# Patient Record
Sex: Male | Born: 1970 | Hispanic: Yes | Marital: Single | Smoking: Current some day smoker
Health system: Southern US, Community
[De-identification: ages and names within clinical notes are randomized; demographics above are authoritative.]

## PROBLEM LIST (undated history)

## (undated) DIAGNOSIS — G4730 Sleep apnea, unspecified: Secondary | ICD-10-CM

## (undated) DIAGNOSIS — I10 Essential (primary) hypertension: Secondary | ICD-10-CM

## (undated) DIAGNOSIS — M1990 Unspecified osteoarthritis, unspecified site: Secondary | ICD-10-CM

## (undated) DIAGNOSIS — E119 Type 2 diabetes mellitus without complications: Secondary | ICD-10-CM

## (undated) DIAGNOSIS — E785 Hyperlipidemia, unspecified: Secondary | ICD-10-CM

## (undated) DIAGNOSIS — D649 Anemia, unspecified: Secondary | ICD-10-CM

## (undated) DIAGNOSIS — N189 Chronic kidney disease, unspecified: Secondary | ICD-10-CM

## (undated) HISTORY — DX: Sleep apnea, unspecified: G47.30

## (undated) HISTORY — DX: Hyperlipidemia, unspecified: E78.5

## (undated) HISTORY — DX: Anemia, unspecified: D64.9

## (undated) HISTORY — DX: Chronic kidney disease, unspecified: N18.9

## (undated) HISTORY — DX: Type 2 diabetes mellitus without complications: E11.9

## (undated) HISTORY — PX: NO PAST SURGERIES: SHX2092

## (undated) HISTORY — DX: Essential (primary) hypertension: I10

## (undated) HISTORY — DX: Unspecified osteoarthritis, unspecified site: M19.90

---

## 2004-07-02 ENCOUNTER — Emergency Department: Payer: Self-pay | Admitting: Emergency Medicine

## 2004-07-04 ENCOUNTER — Inpatient Hospital Stay: Payer: Self-pay | Admitting: Internal Medicine

## 2004-08-10 ENCOUNTER — Ambulatory Visit: Payer: Self-pay | Admitting: Family Medicine

## 2005-09-20 ENCOUNTER — Emergency Department: Payer: Self-pay | Admitting: Emergency Medicine

## 2012-02-21 LAB — HM DIABETES EYE EXAM

## 2012-06-23 ENCOUNTER — Emergency Department: Payer: Self-pay | Admitting: Emergency Medicine

## 2012-12-11 ENCOUNTER — Ambulatory Visit: Payer: Self-pay | Admitting: Adult Health

## 2012-12-23 ENCOUNTER — Ambulatory Visit: Payer: Self-pay | Admitting: Adult Health

## 2014-10-14 ENCOUNTER — Other Ambulatory Visit: Payer: Self-pay

## 2014-10-21 ENCOUNTER — Ambulatory Visit: Payer: Self-pay

## 2014-11-23 ENCOUNTER — Ambulatory Visit: Payer: Self-pay

## 2014-12-28 IMAGING — CR DG CHEST 2V
1 series · 1 of 1 positions shown · non-contrast
Comparison: none

REASON FOR EXAM: Tachycardia
COMMENTS:

[pa]
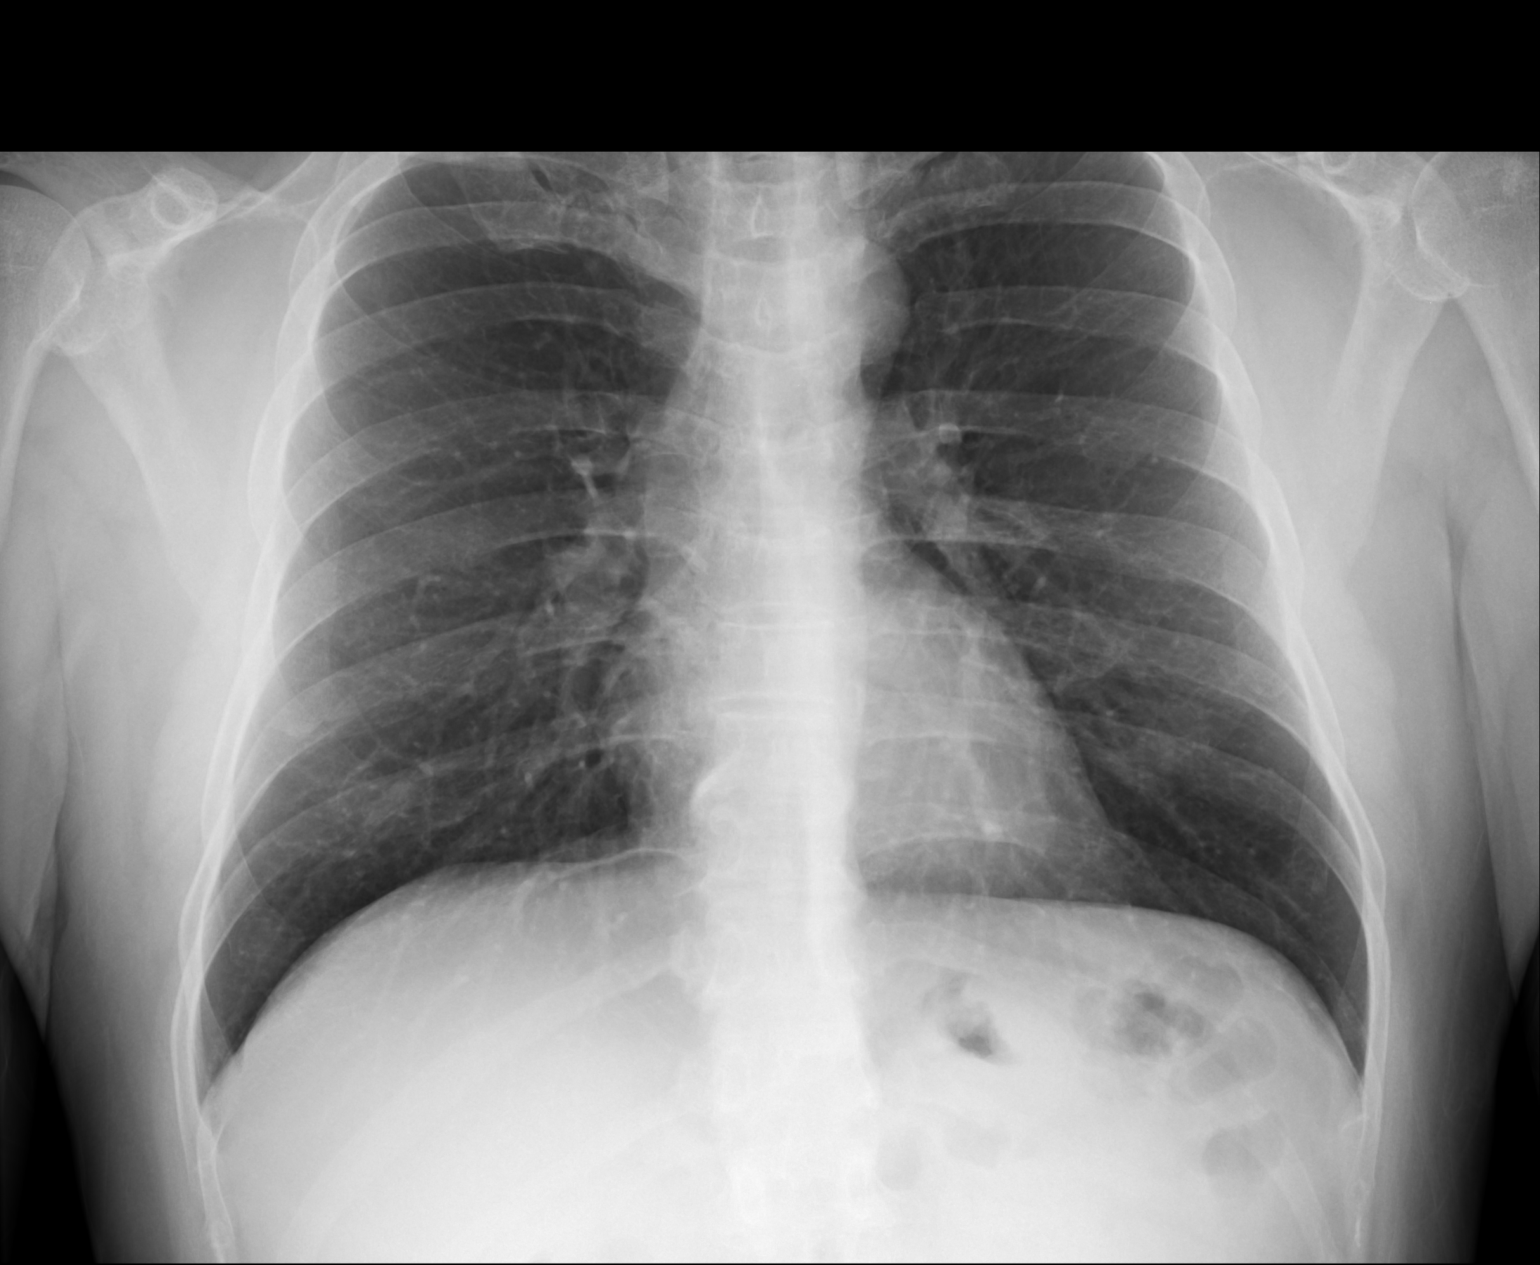

[1 of 1 positions shown; findings below may reference images not displayed]

PROCEDURE:     DXR - DXR CHEST PA (OR AP) AND LATERAL  - December 11, 2012 [DATE]

RESULT:     There is no previous study for comparison. There is mild
hyperinflation. The lungs are clear. The heart and pulmonary vessels are
normal. The bony and mediastinal structures are unremarkable. There is no
effusion. There is no pneumothorax or evidence of congestive failure.
IMPRESSION: No acute cardiopulmonary disease.

[REDACTED]

## 2014-12-28 IMAGING — CR DG CHEST 2V
1 series · 1 of 1 positions shown · non-contrast
Comparison: none

REASON FOR EXAM: Tachycardia
COMMENTS:

[lat]
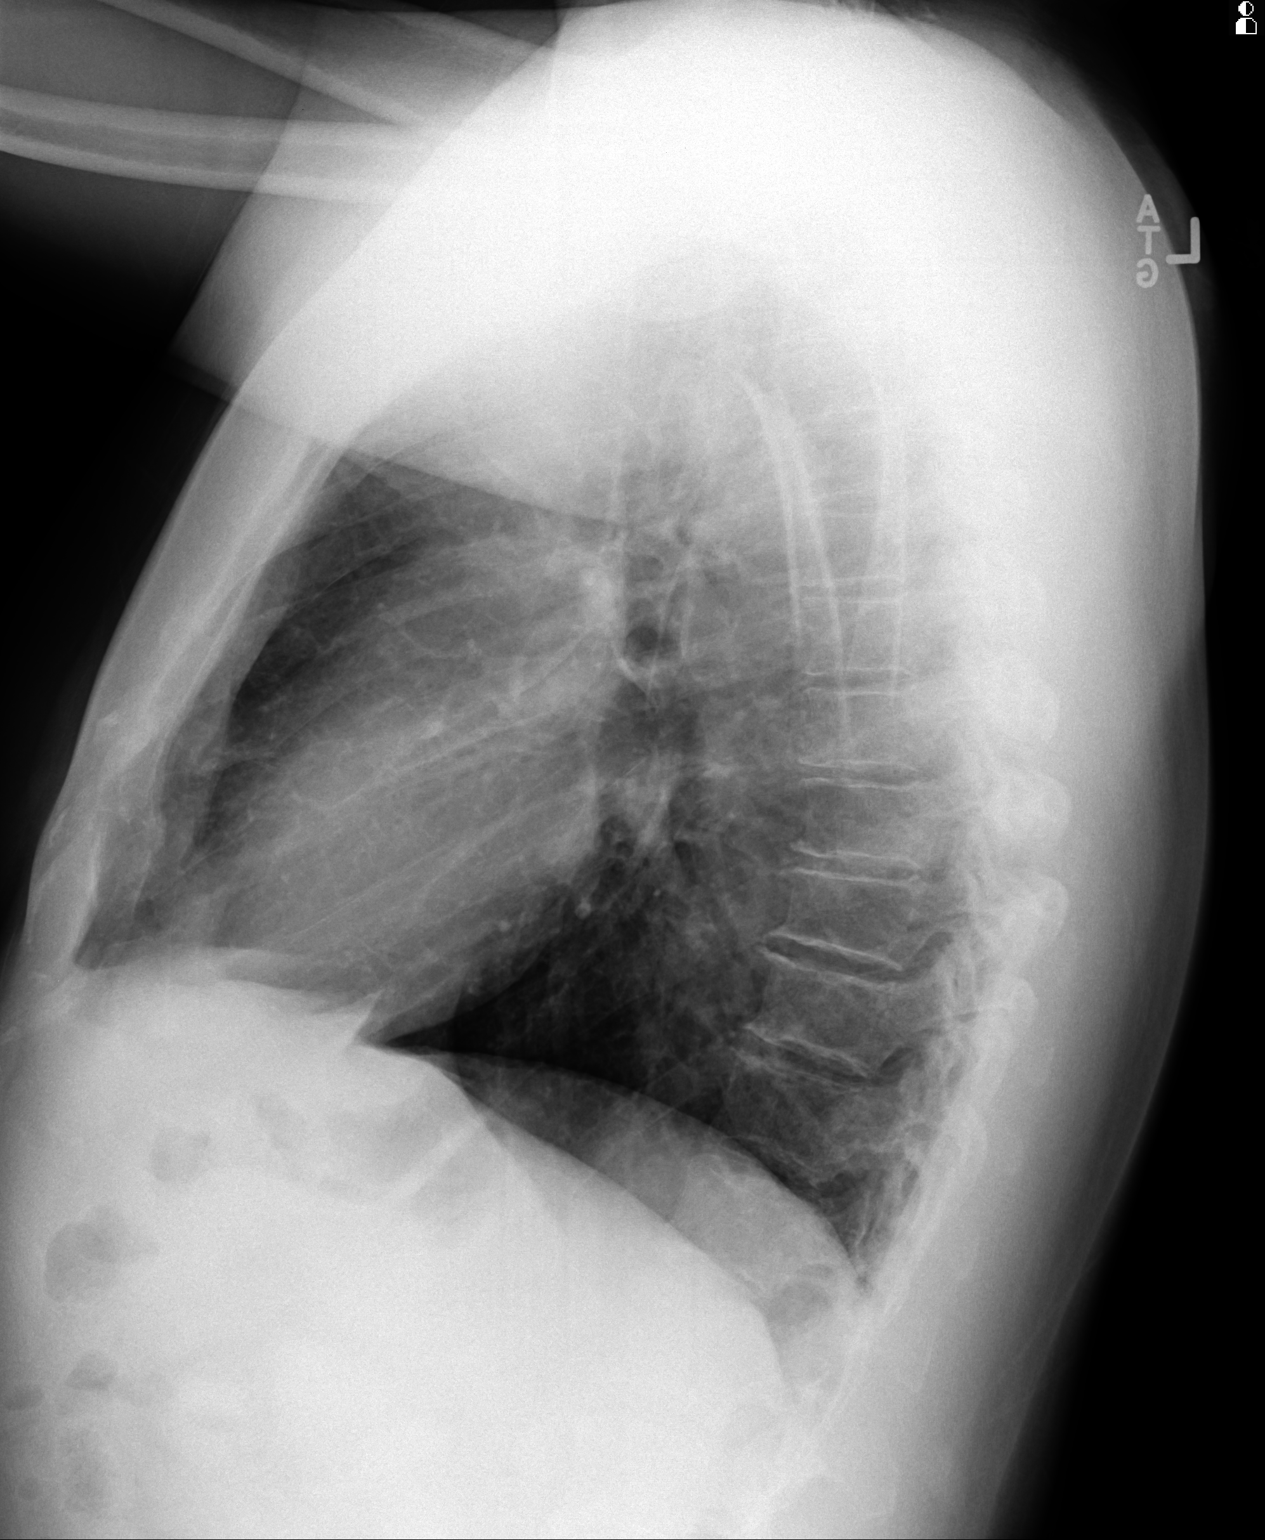

[1 of 1 positions shown; findings below may reference images not displayed]

PROCEDURE:     DXR - DXR CHEST PA (OR AP) AND LATERAL  - December 11, 2012 [DATE]

RESULT:     There is no previous study for comparison. There is mild
hyperinflation. The lungs are clear. The heart and pulmonary vessels are
normal. The bony and mediastinal structures are unremarkable. There is no
effusion. There is no pneumothorax or evidence of congestive failure.
IMPRESSION: No acute cardiopulmonary disease.

[REDACTED]

## 2015-01-27 ENCOUNTER — Other Ambulatory Visit: Payer: Self-pay

## 2015-01-28 LAB — HEPATIC FUNCTION PANEL
ALT: 20 U/L (ref 10–40)
AST: 14 U/L (ref 14–40)
Alkaline Phosphatase: 56 U/L (ref 25–125)
BILIRUBIN, TOTAL: 0.7 mg/dL

## 2015-01-28 LAB — CBC AND DIFFERENTIAL
HCT: 44 % (ref 41–53)
Hemoglobin: 15.2 g/dL (ref 13.5–17.5)
Neutrophils Absolute: 4 /uL
Platelets: 254 10*3/uL (ref 150–399)
WBC: 6.6 10^3/mL

## 2015-01-28 LAB — BASIC METABOLIC PANEL
BUN: 12 mg/dL (ref 4–21)
Creatinine: 0.6 mg/dL (ref 0.6–1.3)
GLUCOSE: 103 mg/dL
POTASSIUM: 4.2 mmol/L (ref 3.4–5.3)
SODIUM: 139 mmol/L (ref 137–147)

## 2015-01-28 LAB — LIPID PANEL
Cholesterol: 144 mg/dL (ref 0–200)
HDL: 56 mg/dL (ref 35–70)
LDL Cholesterol: 72 mg/dL
TRIGLYCERIDES: 80 mg/dL (ref 40–160)

## 2015-01-28 LAB — HEMOGLOBIN A1C: Hemoglobin A1C: 6.3

## 2015-02-03 ENCOUNTER — Ambulatory Visit: Payer: Self-pay

## 2015-02-03 DIAGNOSIS — E119 Type 2 diabetes mellitus without complications: Secondary | ICD-10-CM | POA: Insufficient documentation

## 2015-02-03 DIAGNOSIS — I10 Essential (primary) hypertension: Secondary | ICD-10-CM | POA: Insufficient documentation

## 2015-02-03 DIAGNOSIS — E785 Hyperlipidemia, unspecified: Secondary | ICD-10-CM | POA: Insufficient documentation

## 2015-02-15 ENCOUNTER — Other Ambulatory Visit: Payer: Self-pay

## 2015-02-17 ENCOUNTER — Ambulatory Visit: Payer: Self-pay | Admitting: Ophthalmology

## 2015-02-22 ENCOUNTER — Ambulatory Visit: Payer: Self-pay

## 2015-04-26 ENCOUNTER — Ambulatory Visit: Payer: Self-pay

## 2015-05-05 ENCOUNTER — Other Ambulatory Visit: Payer: Self-pay

## 2015-05-12 ENCOUNTER — Ambulatory Visit: Payer: Self-pay

## 2015-05-27 DIAGNOSIS — E785 Hyperlipidemia, unspecified: Secondary | ICD-10-CM

## 2015-05-27 DIAGNOSIS — I10 Essential (primary) hypertension: Secondary | ICD-10-CM

## 2015-05-27 DIAGNOSIS — E119 Type 2 diabetes mellitus without complications: Secondary | ICD-10-CM

## 2015-06-28 ENCOUNTER — Ambulatory Visit: Payer: Self-pay

## 2015-07-13 ENCOUNTER — Other Ambulatory Visit: Payer: Self-pay

## 2015-07-13 NOTE — Telephone Encounter (Signed)
[facility] carvedilol .  Not able to authorize request pt has not been seen at [facility] since oct 2016 and was a no show for last 2 scheduled appts. [facility] to [facility] not able to authorize request

## 2015-07-19 ENCOUNTER — Other Ambulatory Visit: Payer: Self-pay

## 2015-07-19 NOTE — Telephone Encounter (Signed)
[facility] to [facility]ill carvedilol .  Not able to authorize request pt has not been seen at [facility] since oct 2016 and was a no show for last 2 scheduled appts. [facility] not able to authorize request 

## 2015-08-02 ENCOUNTER — Ambulatory Visit: Payer: Self-pay

## 2015-08-16 ENCOUNTER — Ambulatory Visit: Payer: Self-pay

## 2015-08-30 ENCOUNTER — Ambulatory Visit: Payer: Self-pay | Admitting: Urology

## 2015-08-30 VITALS — BP 122/83 | HR 97 | Temp 98.7°F | Wt 161.0 lb

## 2015-08-30 DIAGNOSIS — E119 Type 2 diabetes mellitus without complications: Secondary | ICD-10-CM

## 2015-08-30 LAB — GLUCOSE, POCT (MANUAL RESULT ENTRY): POC GLUCOSE: 111 mg/dL — AB (ref 70–99)

## 2015-08-30 MED ORDER — CARVEDILOL 12.5 MG PO TABS: 12.5000 mg | ORAL_TABLET | Freq: Two times a day (BID) | ORAL | Status: DC

## 2015-08-30 MED ORDER — METFORMIN HCL 1000 MG PO TABS: 1000.0000 mg | ORAL_TABLET | Freq: Two times a day (BID) | ORAL | Status: DC

## 2015-08-30 MED ORDER — SIMVASTATIN 20 MG PO TABS: 20.0000 mg | ORAL_TABLET | Freq: Every day | ORAL | Status: DC

## 2015-08-30 NOTE — Progress Notes (Signed)
  Patient: Hector Weber Male    DOB: 1970-12-11   45 y.o.   MRN: [id number] Visit Date: 08/30/2015  Today's Provider: [facility]   Chief Complaint  Patient presents with  . Medication Refill   Subjective:    HPI   Patient with DM, HTN and lipids.  Has been without medications for 2 months.  Not seen since October 2016.    No Known Allergies Previous Medications   CARVEDILOL (COREG) 12.5 MG TABLET    Take 12.5 mg by mouth 2 (two) times daily with a meal. Reported on 08/30/2015   METFORMIN (GLUCOPHAGE) 1000 MG TABLET    Take 1,000 mg by mouth 2 (two) times daily with a meal. Reported on 08/30/2015   SIMVASTATIN (ZOCOR) 20 MG TABLET    Take 20 mg by mouth daily. Reported on 08/30/2015    Review of Systems  Constitutional: Negative.   HENT: Negative.   Eyes: Negative.   Respiratory: Negative.   Cardiovascular: Negative.   Gastrointestinal: Negative.   Endocrine: Negative.   Genitourinary: Negative.   Musculoskeletal: Negative.   Skin: Negative.   Allergic/Immunologic: Negative.   Neurological: Negative.   Hematological: Negative.   Psychiatric/Behavioral: Negative.     Social History  Substance Use Topics  . Smoking status: Current Some Day Smoker    Types: Cigarettes  . Smokeless tobacco: Not on file  . Alcohol Use: Yes     Comment: 6 pack/month   Objective:   BP 122/83 mmHg  Pulse 97  Temp(Src) 98.7 F (37.1 C)  Wt 161 lb (73.029 kg)  Physical Exam  Constitutional: He appears well-developed and well-nourished.  HENT:  Head: Normocephalic and atraumatic.  Eyes: Conjunctivae and EOM are normal. Pupils are equal, round, and reactive to light.  Cardiovascular: Normal rate, regular rhythm and normal heart sounds.   Pulmonary/Chest: Effort normal and breath sounds normal.  Abdominal: Soft. Bowel sounds are normal.  Skin: Skin is warm and dry.  Psychiatric: He has a normal mood and affect. His behavior is normal. Judgment and thought content normal.         Assessment & Plan:     1. HTN  -meds refilled tonight  -CMP,TSH, CBC drawn tonight  2. DM  -meds refilled tonight  -HbgA1C drawn tonight  -schedule eye exam  3.HLD  -meds refilled tonight  -Lipids drawn tonight        [facility]   [facility]

## 2015-09-01 LAB — COMPREHENSIVE METABOLIC PANEL
A/G RATIO: 2 (ref 1.2–2.2)
ALK PHOS: 76 IU/L (ref 39–117)
ALT: 27 IU/L (ref 0–44)
AST: 22 IU/L (ref 0–40)
Albumin: 4.9 g/dL (ref 3.5–5.5)
BUN / CREAT RATIO: 22 — AB (ref 9–20)
BUN: 14 mg/dL (ref 6–24)
Bilirubin Total: 0.7 mg/dL (ref 0.0–1.2)
CALCIUM: 9.6 mg/dL (ref 8.7–10.2)
CO2: 25 mmol/L (ref 18–29)
Chloride: 99 mmol/L (ref 96–106)
Creatinine, Ser: 0.64 mg/dL — ABNORMAL LOW (ref 0.76–1.27)
GFR calc Af Amer: 139 mL/min/{1.73_m2} (ref >59)
GFR, EST NON AFRICAN AMERICAN: 120 mL/min/{1.73_m2} (ref >59)
GLOBULIN, TOTAL: 2.5 g/dL (ref 1.5–4.5)
Glucose: 116 mg/dL — ABNORMAL HIGH (ref 65–99)
POTASSIUM: 5 mmol/L (ref 3.5–5.2)
SODIUM: 140 mmol/L (ref 134–144)
Total Protein: 7.4 g/dL (ref 6.0–8.5)

## 2015-09-01 LAB — CBC WITH DIFFERENTIAL/PLATELET
Basophils Absolute: 0 10*3/uL (ref 0.0–0.2)
Basos: 0 %
EOS (ABSOLUTE): 0.1 10*3/uL (ref 0.0–0.4)
Eos: 1 %
Hematocrit: 47.2 % (ref 37.5–51.0)
Hemoglobin: 16.5 g/dL (ref 12.6–17.7)
Immature Grans (Abs): 0 10*3/uL (ref 0.0–0.1)
Immature Granulocytes: 0 %
Lymphocytes Absolute: 1.6 10*3/uL (ref 0.7–3.1)
Lymphs: 29 %
MCH: 30.8 pg (ref 26.6–33.0)
MCHC: 35 g/dL (ref 31.5–35.7)
MCV: 88 fL (ref 79–97)
Monocytes Absolute: 0.4 10*3/uL (ref 0.1–0.9)
Monocytes: 8 %
Neutrophils Absolute: 3.4 10*3/uL (ref 1.4–7.0)
Neutrophils: 62 %
Platelets: 242 10*3/uL (ref 150–379)
RBC: 5.36 x10E6/uL (ref 4.14–5.80)
RDW: 12.8 % (ref 12.3–15.4)
WBC: 5.5 10*3/uL (ref 3.4–10.8)

## 2015-09-01 LAB — HEMOGLOBIN A1C
Est. average glucose Bld gHb Est-mCnc: 174 mg/dL
Hgb A1c MFr Bld: 7.7 % — ABNORMAL HIGH (ref 4.8–5.6)

## 2015-09-01 LAB — LIPID PANEL
CHOLESTEROL TOTAL: 228 mg/dL — AB (ref 100–199)
Chol/HDL Ratio: 4.2 ratio units (ref 0.0–5.0)
HDL: 54 mg/dL (ref >39)
LDL Calculated: 151 mg/dL — ABNORMAL HIGH (ref 0–99)
TRIGLYCERIDES: 114 mg/dL (ref 0–149)
VLDL CHOLESTEROL CAL: 23 mg/dL (ref 5–40)

## 2015-09-01 LAB — MICROSCOPIC EXAMINATION
BACTERIA UA: NONE SEEN
Casts: NONE SEEN /lpf
Epithelial Cells (non renal): NONE SEEN /hpf (ref 0–10)

## 2015-09-01 LAB — URINALYSIS, COMPLETE
Bilirubin, UA: NEGATIVE
Glucose, UA: NEGATIVE
Leukocytes, UA: NEGATIVE
Nitrite, UA: NEGATIVE
Protein, UA: NEGATIVE
RBC, UA: NEGATIVE
Specific Gravity, UA: 1.022 (ref 1.005–1.030)
Urobilinogen, Ur: 0.2 mg/dL (ref 0.2–1.0)
pH, UA: 5.5 (ref 5.0–7.5)

## 2015-09-01 LAB — TSH: TSH: 0.672 u[IU]/mL (ref 0.450–4.500)

## 2015-09-01 LAB — MICROALBUMIN / CREATININE URINE RATIO
CREATININE, UR: 142.2 mg/dL
MICROALB/CREAT RATIO: 4 mg/g creat (ref 0.0–30.0)
Microalbumin, Urine: 5.7 ug/mL

## 2015-09-01 LAB — HOMOCYSTEINE: HOMOCYSTEINE: 9.6 umol/L (ref 0.0–15.0)

## 2015-09-03 LAB — CULTURE, URINE COMPREHENSIVE

## 2016-01-10 ENCOUNTER — Ambulatory Visit: Payer: Self-pay | Admitting: Urology

## 2016-03-21 ENCOUNTER — Other Ambulatory Visit: Payer: Self-pay

## 2016-04-17 ENCOUNTER — Ambulatory Visit: Payer: Self-pay | Admitting: Family Medicine

## 2016-04-17 VITALS — BP 122/77 | HR 91 | Temp 98.6°F | Wt 167.0 lb

## 2016-04-17 DIAGNOSIS — E119 Type 2 diabetes mellitus without complications: Secondary | ICD-10-CM

## 2016-04-17 LAB — GLUCOSE, POCT (MANUAL RESULT ENTRY): POC Glucose: 113 mg/dl — AB (ref 70–99)

## 2016-04-17 MED ORDER — CARVEDILOL 12.5 MG PO TABS: 12.5000 mg | ORAL_TABLET | Freq: Two times a day (BID) | ORAL | 4 refills | Status: DC

## 2016-04-17 MED ORDER — SIMVASTATIN 20 MG PO TABS: 20.0000 mg | ORAL_TABLET | Freq: Every day | ORAL | 4 refills | Status: DC

## 2016-04-17 MED ORDER — METFORMIN HCL 1000 MG PO TABS: 1000.0000 mg | ORAL_TABLET | Freq: Two times a day (BID) | ORAL | 4 refills | Status: DC

## 2016-04-17 NOTE — Progress Notes (Signed)
Subjective:     Patient ID: Hector Weber, male   DOB: 19-Aug-1970, 46 y.o.   MRN: [id number]  HPI  Pt not seen here for many years.  On meds for Dm, HTN, lipids.  Has no c/o's.  no thirst, frequent urine, CP, SOB, swelling. No labs recently.  BS's <150.  Review of Systems     Objective:   Physical Exam A+O Conj. Clear CTA RRR, no murmur NT/ND +2, no edema    Assessment:   1. DM--check  HgA1c, Met B.  Stay on meto/formin 1000 mg BID 2.  HTN .  Stay on Coreg 12.5 mg BID.  Check CBC and Met B. 3.  Hyperlipidenia.  Check lipids and liver. Adjust simvastatin if needed.. 4.  RTC in 3-4 months.    Plan:

## 2016-04-17 NOTE — Progress Notes (Signed)
mmm

## 2016-04-18 LAB — COMPREHENSIVE METABOLIC PANEL
A/G RATIO: 2 (ref 1.2–2.2)
ALT: 27 IU/L (ref 0–44)
AST: 18 IU/L (ref 0–40)
Albumin: 4.8 g/dL (ref 3.5–5.5)
Alkaline Phosphatase: 84 IU/L (ref 39–117)
BUN/Creatinine Ratio: 15 (ref 9–20)
BUN: 10 mg/dL (ref 6–24)
Bilirubin Total: 0.6 mg/dL (ref 0.0–1.2)
CALCIUM: 9.4 mg/dL (ref 8.7–10.2)
CO2: 22 mmol/L (ref 18–29)
Chloride: 102 mmol/L (ref 96–106)
Creatinine, Ser: 0.68 mg/dL — ABNORMAL LOW (ref 0.76–1.27)
GFR, EST AFRICAN AMERICAN: 135 mL/min/{1.73_m2} (ref >59)
GFR, EST NON AFRICAN AMERICAN: 117 mL/min/{1.73_m2} (ref >59)
GLOBULIN, TOTAL: 2.4 g/dL (ref 1.5–4.5)
Glucose: 127 mg/dL — ABNORMAL HIGH (ref 65–99)
POTASSIUM: 4.1 mmol/L (ref 3.5–5.2)
Sodium: 143 mmol/L (ref 134–144)
Total Protein: 7.2 g/dL (ref 6.0–8.5)

## 2016-04-18 LAB — CBC WITH DIFFERENTIAL/PLATELET
BASOS: 0 %
Basophils Absolute: 0 10*3/uL (ref 0.0–0.2)
EOS (ABSOLUTE): 0.1 10*3/uL (ref 0.0–0.4)
EOS: 1 %
Hematocrit: 47.5 % (ref 37.5–51.0)
Hemoglobin: 16.7 g/dL (ref 13.0–17.7)
IMMATURE GRANS (ABS): 0 10*3/uL (ref 0.0–0.1)
IMMATURE GRANULOCYTES: 0 %
Lymphocytes Absolute: 2.1 10*3/uL (ref 0.7–3.1)
Lymphs: 34 %
MCH: 31.2 pg (ref 26.6–33.0)
MCHC: 35.2 g/dL (ref 31.5–35.7)
MCV: 89 fL (ref 79–97)
MONOS ABS: 0.6 10*3/uL (ref 0.1–0.9)
Monocytes: 9 %
NEUTROS ABS: 3.4 10*3/uL (ref 1.4–7.0)
NEUTROS PCT: 56 %
Platelets: 247 10*3/uL (ref 150–379)
RBC: 5.35 x10E6/uL (ref 4.14–5.80)
RDW: 12.7 % (ref 12.3–15.4)
WBC: 6.2 10*3/uL (ref 3.4–10.8)

## 2016-04-18 LAB — HEMOGLOBIN A1C
Est. average glucose Bld gHb Est-mCnc: 171 mg/dL
Hgb A1c MFr Bld: 7.6 % — ABNORMAL HIGH (ref 4.8–5.6)

## 2016-04-18 LAB — LIPID PANEL
CHOL/HDL RATIO: 4.6 ratio (ref 0.0–5.0)
Cholesterol, Total: 207 mg/dL — ABNORMAL HIGH (ref 100–199)
HDL: 45 mg/dL (ref >39)
LDL CALC: 126 mg/dL — AB (ref 0–99)
Triglycerides: 182 mg/dL — ABNORMAL HIGH (ref 0–149)
VLDL CHOLESTEROL CAL: 36 mg/dL (ref 5–40)

## 2016-07-17 ENCOUNTER — Ambulatory Visit: Payer: Self-pay | Admitting: Family Medicine

## 2016-07-17 DIAGNOSIS — I10 Essential (primary) hypertension: Secondary | ICD-10-CM

## 2016-07-17 DIAGNOSIS — Z0000 Encounter for general adult medical examination without abnormal findings: Secondary | ICD-10-CM | POA: Insufficient documentation

## 2016-07-17 DIAGNOSIS — Z5181 Encounter for therapeutic drug level monitoring: Secondary | ICD-10-CM | POA: Insufficient documentation

## 2016-07-17 DIAGNOSIS — E785 Hyperlipidemia, unspecified: Secondary | ICD-10-CM

## 2016-07-17 DIAGNOSIS — E1165 Type 2 diabetes mellitus with hyperglycemia: Secondary | ICD-10-CM

## 2016-07-17 HISTORY — DX: Encounter for therapeutic drug level monitoring: Z51.81

## 2016-07-17 NOTE — Progress Notes (Signed)
BP 133/79   Pulse 95   Temp 98.4 F (36.9 C)   Wt 164 lb 8 oz (74.6 kg)    Subjective:    Patient ID: Hector Weber, male    DOB: 26-Feb-1971, 46 y.o.   MRN: [id number]  HPI: Hector Weber is a 46 y.o. male  Chief Complaint  Patient presents with  . Labs Only   Type 2 diabetes [name] checking sugars He eats breakfast in the morning Last thing was 9:30 am Not bad dry mouth Little blurred vision Mother has diabetes Patient's last A1c was 7.6 in January Limit rice He has done the classes Tries to do more exercise Last eye exam 4-5 years ago  High cholesterol; LDL came down from 151 to 126 Trying to eat better; not much fats  CKD is in problem list, but last two creatinines were excellent; he says he had kidney problems when he first found out he had diaetes years ago; it sounded like it was pretty severe at the beginning; he is not on an ACE-I  Relevant past medical, surgical, family and social history reviewed Past Medical History:  Diagnosis Date  . Anemia   . Arthritis   . Chronic kidney disease   . Diabetes ([place])   . Hyperlipidemia   . Hypertension   . Sleep apnea    No past surgical history on file.   Family History  Problem Relation Age of Onset  . Diabetes type II Mother   . Seizures Father    Social History  Substance Use Topics  . Smoking status: Current Some Day Smoker    Types: Cigarettes  . Smokeless tobacco: Current User    Types: Snuff, Chew  . Alcohol use 3.6 oz/week    6 Cans of beer per week     Comment: 6 pack/month   Interim medical history since last visit reviewed. Allergies and medications reviewed  Review of Systems Per HPI unless specifically indicated above     Objective:    BP 133/79   Pulse 95   Temp 98.4 F (36.9 C)   Wt 164 lb 8 oz (74.6 kg)   Wt Readings from Last 3 Encounters:  07/17/16 164 lb 8 oz (74.6 kg)  04/17/16 167 lb (75.8 kg)  08/30/15 161 lb (73 kg)    Physical Exam  Constitutional: He  appears well-developed and well-nourished. No distress.  HENT:  Head: Normocephalic and atraumatic.  Eyes: EOM are normal. No scleral icterus.  Neck: No thyromegaly present.  Cardiovascular: Normal rate and regular rhythm.   Pulmonary/Chest: Effort normal and breath sounds normal.  Abdominal: Soft. Bowel sounds are normal. He exhibits no distension.  Neurological: [name] alert.  Skin: Skin is warm and dry. No pallor.  Psychiatric: He has a normal mood and affect. His behavior is normal. Judgment and thought content normal.    Results for orders placed or performed in visit on 04/17/16  CBC w/Diff  Result Value Ref Range   WBC 6.2 3.4 - 10.8 x10E3/uL   RBC 5.35 4.14 - 5.80 x10E6/uL   Hemoglobin 16.7 13.0 - 17.7 g/dL   Hematocrit 47.5 37.5 - 51.0 %   MCV 89 79 - 97 fL   MCH 31.2 26.6 - 33.0 pg   MCHC 35.2 31.5 - 35.7 g/dL   RDW 12.7 12.3 - 15.4 %   Platelets 247 150 - 379 x10E3/uL   Neutrophils 56 Not Estab. %   Lymphs 34 Not Estab. %  Monocytes 9 Not Estab. %   Eos 1 Not Estab. %   Basos 0 Not Estab. %   Neutrophils Absolute 3.4 1.4 - 7.0 x10E3/uL   Lymphocytes Absolute 2.1 0.7 - 3.1 x10E3/uL   Monocytes Absolute 0.6 0.1 - 0.9 x10E3/uL   EOS (ABSOLUTE) 0.1 0.0 - 0.4 x10E3/uL   Basophils Absolute 0.0 0.0 - 0.2 x10E3/uL   Immature Granulocytes 0 Not Estab. %   Immature Grans (Abs) 0.0 0.0 - 0.1 x10E3/uL  Comp Met (CMET)  Result Value Ref Range   Glucose 127 (H) 65 - 99 mg/dL   BUN 10 6 - 24 mg/dL   Creatinine, Ser 0.68 (L) 0.76 - 1.27 mg/dL   GFR calc non Af Amer 117 >59 mL/min/1.73   GFR calc Af Amer 135 >59 mL/min/1.73   BUN/Creatinine Ratio 15 9 - 20   Sodium 143 134 - 144 mmol/L   Potassium 4.1 3.5 - 5.2 mmol/L   Chloride 102 96 - 106 mmol/L   CO2 22 18 - 29 mmol/L   Calcium 9.4 8.7 - 10.2 mg/dL   Total Protein 7.2 6.0 - 8.5 g/dL   Albumin 4.8 3.5 - 5.5 g/dL   Globulin, Total 2.4 1.5 - 4.5 g/dL   Albumin/Globulin Ratio 2.0 1.2 - 2.2   Bilirubin Total 0.6 0.0 -  1.2 mg/dL   Alkaline Phosphatase 84 39 - 117 IU/L   AST 18 0 - 40 IU/L   ALT 27 0 - 44 IU/L  HgB A1c  Result Value Ref Range   Hgb A1c MFr Bld 7.6 (H) 4.8 - 5.6 %   Est. average glucose Bld gHb Est-mCnc 171 mg/dL  Lipid Profile  Result Value Ref Range   Cholesterol, Total 207 (H) 100 - 199 mg/dL   Triglycerides 182 (H) 0 - 149 mg/dL   HDL 45 >39 mg/dL   VLDL Cholesterol Cal 36 5 - 40 mg/dL   LDL Calculated 126 (H) 0 - 99 mg/dL   Chol/HDL Ratio 4.6 0.0 - 5.0 ratio units  POCT Glucose (CBG)  Result Value Ref Range   POC Glucose 113 (A) 70 - 99 mg/dl      Assessment & Plan:   Problem List Items Addressed This Visit      Cardiovascular and Mediastinum   Essential hypertension    Try to limit salt; DASH guidelines; not on an ACE-I and I don't know why; recent creatinine readings excellent; last urine microalbumin:Cr normal        Endocrine   Diabetes (HCC) (Chronic)    Check A1c; offered class/nutritionist refresher, already attended he says; try to limit tortillas, rice, sweet drinks; refer for eye exam; urine microalbumin:Cr UTD, last May 2018, due at next 3 month visit      Relevant Orders   Hemoglobin A1c   Lipid panel   Ambulatory referral to Ophthalmology     Other   Medication monitoring encounter    Check SGPT on the statin      Relevant Orders   Basic Metabolic Panel (BMET)   Hyperlipidemia LDL goal <100    Try to limit fats; continue statin; check lipids          Follow up plan: Return in about 3 months (around 10/16/2016) for twenty minute follow-up with fasting labs.  An after-visit summary was printed and given to the patient at [place].  Please see the patient instructions which may contain other information and recommendations beyond what is mentioned above in the assessment and plan.  No orders of the defined types were placed in this encounter.   Orders Placed This Encounter  Procedures  . Hemoglobin A1c  . Lipid panel  . Basic Metabolic  Panel (BMET)  . Ambulatory referral to Ophthalmology

## 2016-07-17 NOTE — Assessment & Plan Note (Signed)
Check SGPT on the statin 

## 2016-07-17 NOTE — Patient Instructions (Addendum)
Try to limit saturated fats in your diet (bologna, hot dogs, barbeque, cheeseburgers, hamburgers, steak, bacon, sausage, cheese, etc.) and get more fresh fruits, vegetables, and whole grains  We'll have you see an eye doctor  Your goal blood pressure is less than 130 mmHg on top. Try to follow the DASH guidelines (DASH stands for Dietary Approaches to Stop Hypertension) Try to limit the sodium in your diet.  Ideally, consume less than 1.5 grams (less than 1,500mg ) per day. Do not add salt when cooking or at the table.  Check the sodium amount on labels when shopping, and choose items lower in sodium when given a choice. Avoid or limit foods that already contain a lot of sodium. Eat a diet rich in fruits and vegetables and whole grains.   DASH Eating Plan DASH stands for "Dietary Approaches to Stop Hypertension." The DASH eating plan is a healthy eating plan that has been shown to reduce high blood pressure (hypertension). It may also reduce your risk for type 2 diabetes, heart disease, and stroke. The DASH eating plan may also help with weight loss. What are tips for following this plan? General guidelines   Avoid eating more than 2,300 mg (milligrams) of salt (sodium) a day. If you have hypertension, you may need to reduce your sodium intake to 1,500 mg a day.  Limit alcohol intake to no more than 1 drink a day for nonpregnant women and 2 drinks a day for men. One drink equals 12 oz of beer, 5 oz of wine, or 1 oz of hard liquor.  Work with your health care provider to maintain a healthy body weight or to lose weight. Ask what an ideal weight is for you.  Get at least 30 minutes of exercise that causes your heart to beat faster (aerobic exercise) most days of the week. Activities may include walking, swimming, or biking.  Work with your health care provider or diet and nutrition specialist (dietitian) to adjust your eating plan to your individual calorie needs. Reading food labels   Check  food labels for the amount of sodium per serving. Choose foods with less than 5 percent of the Daily Value of sodium. Generally, foods with less than 300 mg of sodium per serving fit into this eating plan.  To find whole grains, look for the word "whole" as the first word in the ingredient list. Shopping   Buy products labeled as "low-sodium" or "no salt added."  Buy fresh foods. Avoid canned foods and premade or frozen meals. Cooking   Avoid adding salt when cooking. Use salt-free seasonings or herbs instead of table salt or sea salt. Check with your health care provider or pharmacist before using salt substitutes.  Do not fry foods. Cook foods using healthy methods such as baking, boiling, grilling, and broiling instead.  Cook with heart-healthy oils, such as olive, canola, soybean, or sunflower oil. Meal planning    Eat a balanced diet that includes:  5 or more servings of fruits and vegetables each day. At each meal, try to fill half of your plate with fruits and vegetables.  Up to 6-8 servings of whole grains each day.  Less than 6 oz of lean meat, poultry, or fish each day. A 3-oz serving of meat is about the same size as a deck of cards. One egg equals 1 oz.  2 servings of low-fat dairy each day.  A serving of nuts, seeds, or beans 5 times each week.  Heart-healthy fats. Healthy fats called  Omega-3 fatty acids are found in foods such as flaxseeds and coldwater fish, like sardines, salmon, and mackerel.  Limit how much you eat of the following:  Canned or prepackaged foods.  Food that is high in trans fat, such as fried foods.  Food that is high in saturated fat, such as fatty meat.  Sweets, desserts, sugary drinks, and other foods with added sugar.  Full-fat dairy products.  Do not salt foods before eating.  Try to eat at least 2 vegetarian meals each week.  Eat more home-cooked food and less restaurant, buffet, and fast food.  When eating at a restaurant, ask  that your food be prepared with less salt or no salt, if possible. What foods are recommended? The items listed may not be a complete list. Talk with your dietitian about what dietary choices are best for you. Grains  Whole-grain or whole-wheat bread. Whole-grain or whole-wheat pasta. Brown rice. Orpah Cobb. Bulgur. Whole-grain and low-sodium cereals. Pita bread. Low-fat, low-sodium crackers. Whole-wheat flour tortillas. Vegetables  Fresh or frozen vegetables (raw, steamed, roasted, or grilled). Low-sodium or reduced-sodium tomato and vegetable juice. Low-sodium or reduced-sodium tomato sauce and tomato paste. Low-sodium or reduced-sodium canned vegetables. Fruits  All fresh, dried, or frozen fruit. Canned fruit in natural juice (without added sugar). Meat and other protein foods  Skinless chicken or [place]. Ground chicken or [place]. Pork with fat trimmed off. Fish and seafood. Egg whites. Dried beans, peas, or lentils. Unsalted nuts, nut butters, and seeds. Unsalted canned beans. Lean cuts of beef with fat trimmed off. Low-sodium, lean deli meat. Dairy  Low-fat (1%) or fat-free (skim) milk. Fat-free, low-fat, or reduced-fat cheeses. Nonfat, low-sodium ricotta or cottage cheese. Low-fat or nonfat yogurt. Low-fat, low-sodium cheese. Fats and oils  Soft margarine without trans fats. Vegetable oil. Low-fat, reduced-fat, or light mayonnaise and salad dressings (reduced-sodium). Canola, safflower, olive, soybean, and sunflower oils. Avocado. Seasoning and other foods  Herbs. Spices. Seasoning mixes without salt. Unsalted popcorn and pretzels. Fat-free sweets. What foods are not recommended? The items listed may not be a complete list. Talk with your dietitian about what dietary choices are best for you. Grains  Baked goods made with fat, such as croissants, muffins, or some breads. Dry pasta or rice meal packs. Vegetables  Creamed or fried vegetables. Vegetables in a cheese sauce. Regular  canned vegetables (not low-sodium or reduced-sodium). Regular canned tomato sauce and paste (not low-sodium or reduced-sodium). Regular tomato and vegetable juice (not low-sodium or reduced-sodium). Rosita Fire. Olives. Fruits  Canned fruit in a light or heavy syrup. Fried fruit. Fruit in cream or butter sauce. Meat and other protein foods  Fatty cuts of meat. Ribs. Fried meat. Tomasa Blase. Sausage. Bologna and other processed lunch meats. Salami. Fatback. Hotdogs. Bratwurst. Salted nuts and seeds. Canned beans with added salt. Canned or smoked fish. Whole eggs or egg yolks. Chicken or [place] with skin. Dairy  Whole or 2% milk, cream, and half-and-half. Whole or full-fat cream cheese. Whole-fat or sweetened yogurt. Full-fat cheese. Nondairy creamers. Whipped toppings. Processed cheese and cheese spreads. Fats and oils  Butter. Stick margarine. Lard. Shortening. Ghee. Bacon fat. Tropical oils, such as coconut, palm kernel, or palm oil. Seasoning and other foods  Salted popcorn and pretzels. Onion salt, garlic salt, seasoned salt, table salt, and sea salt. Worcestershire sauce. Tartar sauce. Barbecue sauce. Teriyaki sauce. Soy sauce, including reduced-sodium. Steak sauce. Canned and packaged gravies. Fish sauce. Oyster sauce. Cocktail sauce. Horseradish that you find on the shelf. Ketchup. Mustard. Meat flavorings  and tenderizers. Bouillon cubes. Hot sauce and Tabasco sauce. Premade or packaged marinades. Premade or packaged taco seasonings. Relishes. Regular salad dressings. Where to find more information:  National Heart, Lung, and Blood Institute: PopSteam.is  American Heart Association: www.heart.org Summary  The DASH eating plan is a healthy eating plan that has been shown to reduce high blood pressure (hypertension). It may also reduce your risk for type 2 diabetes, heart disease, and stroke.  With the DASH eating plan, you should limit salt (sodium) intake to 2,300 mg a day. If you have  hypertension, you may need to reduce your sodium intake to 1,500 mg a day.  When on the DASH eating plan, aim to eat more fresh fruits and vegetables, whole grains, lean proteins, low-fat dairy, and heart-healthy fats.  Work with your health care provider or diet and nutrition specialist (dietitian) to adjust your eating plan to your individual calorie needs. This information is not intended to replace advice given to you by your health care provider. Make sure you discuss any questions you have with your health care provider. Document Released: 03/15/2011 Document Revised: 03/19/2016 Document Reviewed: 03/19/2016 Elsevier Interactive Patient Education  2017 Elsevier Inc. Plan de alimentacin DASH (DASH Eating Plan) DASH es la sigla en ingls de "Enfoques Alimentarios para Detener la Hipertensin". El plan de alimentacin DASH ha demostrado bajar la presin arterial elevada (hipertensin). Los beneficios adicionales para la salud pueden incluir la disminucin del riesgo de diabetes mellitus tipo2, enfermedades cardacas e ictus. Este plan tambin puede ayudar a Geophysical data processor. QU DEBO SABER ACERCA DEL PLAN DE ALIMENTACIN DASH? Para el plan de alimentacin DASH, seguir las siguientes pautas generales:  Elija los alimentos que contienen menos de 150 miligramos de sodio por porcin (segn se indica en la etiqueta de los alimentos).  Use hierbas o aderezos sin sal, en lugar de sal de mesa o sal marina.  Consulte al mdico o farmacutico antes de usar sustitutos de la sal.  Consuma los productos con menor contenido de sodio. Estos productos suelen estar etiquetados como "bajo en sodio" o "sin agregado de sal".  Coma alimentos frescos. No consuma una gran cantidad de alimentos enlatados.  Coma ms verduras, frutas y productos lcteos con bajo contenido de [place].  Elija los cereales integrales. Busque la palabra "integral" en Estate agent de la lista de ingredientes.  Elija el pescado y el  pollo o el pavo sin piel ms a menudo que las carnes rojas. Limite el consumo de pescado, carne de ave y carne a 6onzas (170g) por Futures trader.  Limite el consumo de dulces, postres, azcares y bebidas azucaradas.  Elija las grasas saludables para el corazn.  Consuma ms comida casera y menos de restaurante, de buf y comida rpida.  Limite el consumo de alimentos fritos.  No fra los alimentos. A la hora de cocinarlos, opte por hornearlos, hervirlos, grillarlos y asarlos a Patent attorney.  Cuando coma en un restaurante, pida que preparen su comida con menos sal o, en lo posible, sin nada de sal. QU ALIMENTOS PUEDO COMER? Pida ayuda a un nutricionista para conocer las necesidades calricas individuales. Cereales Pan de salvado o integral. Arroz integral. Pastas de salvado o integrales. Quinua, trigo burgol y cereales integrales. Cereales con bajo contenido de sodio. Tortillas de harina de maz o de salvado. Pan de maz integral. Galletas saladas integrales. Galletas con bajo contenido de [place]. Vegetales Verduras frescas o congeladas (crudas, al vapor, asadas o grilladas). Jugos de tomate y verduras con contenido bajo o reducido  de sodio. Pasta y salsa de tomate con contenido bajo o reducido de sodio. Verduras enlatadas con bajo contenido de sodio o reducido de sodio. Nils Pyle Nils Pyle frescas, en conserva (en su jugo natural) o frutas congeladas. Carnes y otros productos con protenas Carne de res molida (al 85% o ms [place]), carne de res de animales alimentados con pastos o carne de res sin la grasa. Pollo o pavo sin piel. Carne de pollo o de [place]. Cerdo sin la grasa. Todos los pescados y frutos de mar. Huevos. Porotos, guisantes o lentejas secos. Frutos secos y semillas sin sal. Frijoles enlatados sin sal. Lcteos Productos lcteos con bajo contenido de grasas, como Archbold o al 1%, quesos reducidos en grasas o al 2%, ricota con bajo contenido de grasas o [employer], o yogur  natural con bajo contenido de [place]. Quesos con contenido bajo o reducido de sodio. Grasas y Writer en barra que no contengan grasas trans. [place]onesa y alios para ensaladas livianos o reducidos en grasas (reducidos en sodio). [place]. Aceites de crtamo, oliva o canola. Mantequilla natural de man o almendra. Otros Palomitas de maz y pretzels sin sal. Los artculos mencionados arriba pueden no ser [employer] de las bebidas o los alimentos recomendados. Comunquese con el nutricionista para conocer ms opciones. QU ALIMENTOS NO SE RECOMIENDAN? Cereales Pan blanco. Pastas blancas. Arroz blanco. Pan de maz refinado. Bagels y croissants. Galletas saladas que contengan grasas trans. Vegetales Vegetales con crema o fritos. Verduras en salsa de [place]. Verduras enlatadas comunes. Pasta y salsa de tomate en lata comunes. Jugos comunes de tomate y de verduras. Nils Pyle Fruta enlatada en almbar liviano o espeso. Jugo de frutas. Carnes y otros productos con protenas [place] de carne con Holiday representative. Costillas, alas de pollo, tocineta, salchicha, mortadela, salame, chinchulines, tocino, perros calientes, salchichas alemanas y embutidos envasados. Frutos secos y semillas con sal. Frijoles con sal en lata. Lcteos Leche entera o al 2%, crema, mezcla de Barry y crema, y queso crema. Yogur entero o endulzado. Quesos o queso azul con alto contenido de Neurosurgeon. Cremas no lcteas y coberturas batidas. Quesos procesados, quesos para untar o cuajadas. Condimentos Sal de cebolla y ajo, sal condimentada, sal de mesa y sal marina. Salsas en lata y envasadas. Salsa Worcestershire. Salsa trtara. Salsa barbacoa. Salsa teriyaki. Salsa de soja, incluso la que tiene contenido reducido de [place]. Salsa de carne. Salsa de pescado. Salsa de [place]. Salsa rosada. Rbano picante. Ketchup y mostaza. Saborizantes y tiernizantes para carne. Caldo en cubitos. Salsa picante. Salsa tabasco. Adobos. Aderezos para tacos.  Salsas. Grasas y [address], [place] en barra, [place] de [place], [place], [place] clarificada y Steffanie [place] de tocino. Aceites de coco, de palmiste o de palma. Aderezos comunes para ensalada. Otros Pickles y Hector. Palomitas de maz y pretzels con sal. Los artculos mencionados arriba pueden no ser [employer] de las bebidas y los alimentos que se Theatre stage manager. Comunquese con el nutricionista para obtener ms informacin. DNDE Raelyn Mora MS INFORMACIN? Instit[employer] Nacional del [place], del Pulmn y de Risk manager (National Heart, Lung, and Blood Institute): CablePromo.it Esta informacin no tiene Theme park manager el consejo del mdico. Asegrese de hacerle al mdico cualquier pregunta que tenga. Document Released: 03/15/2011 Document Revised: 07/18/2015 Document Reviewed: 01/28/2013 Elsevier Interactive Patient Education  2017 [employer].

## 2016-07-17 NOTE — Assessment & Plan Note (Addendum)
Try to limit salt; DASH guidelines; not on an ACE-I and I don't know why; recent creatinine readings excellent; last urine microalbumin:Cr normal

## 2016-07-17 NOTE — Assessment & Plan Note (Addendum)
Check A1c; offered class/nutritionist refresher, already attended he says; try to limit tortillas, rice, sweet drinks; refer for eye exam; urine microalbumin:Cr UTD, last May 2018, due at next 3 month visit

## 2016-07-17 NOTE — Assessment & Plan Note (Signed)
Try to limit fats; continue statin; check lipids

## 2016-07-18 LAB — BASIC METABOLIC PANEL
BUN / CREAT RATIO: 20 (ref 9–20)
BUN: 12 mg/dL (ref 6–24)
CO2: 22 mmol/L (ref 18–29)
CREATININE: 0.6 mg/dL — AB (ref 0.76–1.27)
Calcium: 9.5 mg/dL (ref 8.7–10.2)
Chloride: 101 mmol/L (ref 96–106)
GFR calc non Af Amer: 122 mL/min/{1.73_m2} (ref >59)
GFR, EST AFRICAN AMERICAN: 141 mL/min/{1.73_m2} (ref >59)
GLUCOSE: 122 mg/dL — AB (ref 65–99)
Potassium: 3.9 mmol/L (ref 3.5–5.2)
SODIUM: 141 mmol/L (ref 134–144)

## 2016-10-16 ENCOUNTER — Other Ambulatory Visit: Payer: Self-pay

## 2016-10-16 ENCOUNTER — Ambulatory Visit: Payer: Self-pay

## 2017-01-03 ENCOUNTER — Other Ambulatory Visit: Payer: Self-pay

## 2017-01-03 DIAGNOSIS — E119 Type 2 diabetes mellitus without complications: Secondary | ICD-10-CM

## 2017-01-03 NOTE — Telephone Encounter (Signed)
RX received. Pt needs to update eligibility before we can refill med.

## 2017-01-07 ENCOUNTER — Other Ambulatory Visit: Payer: Self-pay

## 2017-01-07 DIAGNOSIS — E119 Type 2 diabetes mellitus without complications: Secondary | ICD-10-CM

## 2017-01-07 MED ORDER — METFORMIN HCL 1000 MG PO TABS: 1000.0000 mg | ORAL_TABLET | Freq: Two times a day (BID) | ORAL | 4 refills | Status: DC

## 2017-05-28 ENCOUNTER — Other Ambulatory Visit: Payer: Self-pay | Admitting: Adult Health Nurse Practitioner

## 2017-07-29 ENCOUNTER — Encounter: Payer: Self-pay | Admitting: Emergency Medicine

## 2017-07-29 ENCOUNTER — Emergency Department
Admission: EM | Admit: 2017-07-29 | Discharge: 2017-07-29 | Disposition: A | Discharge status: Home / Self Care | Payer: Self-pay | Attending: Emergency Medicine | Admitting: Emergency Medicine

## 2017-07-29 DIAGNOSIS — N189 Chronic kidney disease, unspecified: Secondary | ICD-10-CM | POA: Insufficient documentation

## 2017-07-29 DIAGNOSIS — Y9389 Activity, other specified: Secondary | ICD-10-CM | POA: Insufficient documentation

## 2017-07-29 DIAGNOSIS — I129 Hypertensive chronic kidney disease with stage 1 through stage 4 chronic kidney disease, or unspecified chronic kidney disease: Secondary | ICD-10-CM | POA: Insufficient documentation

## 2017-07-29 DIAGNOSIS — Y929 Unspecified place or not applicable: Secondary | ICD-10-CM | POA: Insufficient documentation

## 2017-07-29 DIAGNOSIS — X58XXXA Exposure to other specified factors, initial encounter: Secondary | ICD-10-CM | POA: Insufficient documentation

## 2017-07-29 DIAGNOSIS — S0502XA Injury of conjunctiva and corneal abrasion without foreign body, left eye, initial encounter: Secondary | ICD-10-CM | POA: Insufficient documentation

## 2017-07-29 DIAGNOSIS — E1122 Type 2 diabetes mellitus with diabetic chronic kidney disease: Secondary | ICD-10-CM | POA: Insufficient documentation

## 2017-07-29 DIAGNOSIS — Y990 Civilian activity done for income or pay: Secondary | ICD-10-CM | POA: Insufficient documentation

## 2017-07-29 DIAGNOSIS — F17210 Nicotine dependence, cigarettes, uncomplicated: Secondary | ICD-10-CM | POA: Insufficient documentation

## 2017-07-29 DIAGNOSIS — Z7984 Long term (current) use of oral hypoglycemic drugs: Secondary | ICD-10-CM | POA: Insufficient documentation

## 2017-07-29 DIAGNOSIS — Z79899 Other long term (current) drug therapy: Secondary | ICD-10-CM | POA: Insufficient documentation

## 2017-07-29 MED ORDER — GENTAMICIN SULFATE 0.3 % OP SOLN: 1.0000 [drp] | Freq: Four times a day (QID) | OPHTHALMIC | 0 refills | Status: DC

## 2017-07-29 MED ORDER — EYE WASH OPHTH SOLN
OPHTHALMIC | Status: AC
  Administered 2017-07-29: 13:00:00
  Filled 2017-07-29: qty 118

## 2017-07-29 MED ORDER — TETRACAINE HCL 0.5 % OP SOLN
OPHTHALMIC | Status: AC
  Administered 2017-07-29: 13:00:00
  Filled 2017-07-29: qty 4

## 2017-07-29 MED ORDER — FLUORESCEIN SODIUM 1 MG OP STRP
ORAL_STRIP | OPHTHALMIC | Status: AC
  Administered 2017-07-29: 13:00:00
  Filled 2017-07-29: qty 1

## 2017-07-29 MED ORDER — NAPHAZOLINE HCL 0.1 % OP SOLN: 1.0000 [drp] | Freq: Four times a day (QID) | OPHTHALMIC | 0 refills | Status: DC | PRN

## 2017-07-29 NOTE — Discharge Instructions (Addendum)
Use eye drops as directed.

## 2017-07-29 NOTE — ED Provider Notes (Addendum)
[facility] Emergency Department Provider Note   ____________________________________________   First MD Initiated Contact with Patient 07/29/17 1314     (approximate)  I have reviewed the triage vital signs and the nursing notes.   HISTORY  Chief Complaint Eye Injury    HPI Hector Weber is a 47 y.o. male patient presents with left eye pain and clear drainage for 5 days.  Patient state foreign body sensation to the eye.  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  Patient state light sensitivity when outside.  Denies loss of vision.   Past Medical History:  Diagnosis Date  . Anemia   . Arthritis   . Chronic kidney disease   . Diabetes (HCC)   . Hyperlipidemia   . Hypertension   . Sleep apnea     Patient Active Problem List   Diagnosis Date Noted  . Medication monitoring encounter 07/17/2016  . Diabetes (HCC) 02/03/2015  . Essential hypertension 02/03/2015  . Hyperlipidemia LDL goal <100 02/03/2015    No past surgical history on file.  Prior to Admission medications   Medication Sig Start Date End Date Taking? Authorizing Provider  carvedilol (COREG) 12.5 MG tablet TAKE ONE TABLET BY MOUTH TWICE DAILY WITH MEALS. 05/29/17   Virl Axehaplin, Don C, MD  gentamicin (GARAMYCIN) 0.3 % ophthalmic solution Place 1 drop into the left eye 4 (four) times daily. 07/29/17   Joni ReiningSmith, Icelyn Navarrete K, PA-C  metFORMIN (GLUCOPHAGE) 1000 MG tablet Take 1 tablet (1,000 mg total) by mouth 2 (two) times daily with a meal. Reported on 08/30/2015 01/07/17   Doles-Johnson, Teah, NP  naphazoline (NAPHCON) 0.1 % ophthalmic solution Place 1 drop into the left eye 4 (four) times daily as needed for eye irritation. 07/29/17   Joni ReiningSmith, Diala Waxman K, PA-C  simvastatin (ZOCOR) 20 MG tablet Take 1 tablet (20 mg total) by mouth daily. Reported on 08/30/2015 04/17/16   Dorothey BasemanBronstein, David, MD    Allergies Patient has no known  allergies.  Family History  Problem Relation Age of Onset  . Diabetes type II Mother   . Seizures Father     Social History Social History   Tobacco Use  . Smoking status: Current Some Day Smoker    Types: Cigarettes  . Smokeless tobacco: Current User    Types: Snuff, Chew  Substance Use Topics  . Alcohol use: Yes    Alcohol/week: 3.6 oz    Types: 6 Cans of beer per week    Comment: 6 pack/month  . Drug use: No    Review of Systems  Constitutional: No fever/chills Eyes: No visual changes. ENT: No sore throat. Cardiovascular: Denies chest pain. Respiratory: Denies shortness of breath. Gastrointestinal: No abdominal pain.  No nausea, no vomiting.  No diarrhea.  No constipation. Genitourinary: Negative for dysuria. Musculoskeletal: Negative for back pain. Skin: Negative for rash. Neurological: Negative for headaches, focal weakness or numbness. Endocrine:Diabetes, hyperlipidemia, and hypertension.   ____________________________________________   PHYSICAL EXAM:  VITAL SIGNS: ED Triage Vitals [07/29/17 1307]  Enc Vitals Group     BP (!) 142/87     Pulse Rate 92     Resp 16     Temp      Temp src      SpO2 99 %     Weight      Height      Head Circumference      Peak Flow  Pain Score 5     Pain Loc      Pain Edu?      Excl. in GC?     Constitutional: Alert and oriented. Well appearing and in no acute distress. Eyes: . PERRL. EOMI. staining of the eye reveals corneal abrasion inferior pupil area. Cardiovascular: Normal rate, regular rhythm. Grossly normal heart sounds.  Good peripheral circulation. Respiratory: Normal respiratory effort.  No retractions. Lungs CTAB. Skin:  Skin is warm, dry and intact. No rash noted. Psychiatric: Mood and affect are normal. Speech and behavior are normal.  ____________________________________________   LABS (all labs ordered are listed, but only abnormal results are displayed)  Labs Reviewed - No data to  display ____________________________________________  EKG   ____________________________________________  RADIOLOGY    Official radiology report(s): No results found.  ____________________________________________   PROCEDURES  Procedure(s) performed: None  Procedures  Critical Care performed: No  ____________________________________________   INITIAL IMPRESSION / ASSESSMENT AND PLAN / ED COURSE  As part of my medical decision making, I reviewed the following data within the electronic MEDICAL RECORD NUMBER    Left eye pain secondary corneal abrasion.  Patient given discharge care instruction.  Patient advised use eyedrops as directed.  Advised patient no improvement in 3 days follow-up with ophthalmology.   ____________________________________________   FINAL CLINICAL IMPRESSION(S) / ED DIAGNOSES  Final diagnoses:  Left corneal abrasion, initial encounter     ED Discharge Orders        Ordered    gentamicin (GARAMYCIN) 0.3 % ophthalmic solution  4 times daily     07/29/17 1321    naphazoline (NAPHCON) 0.1 % ophthalmic solution  4 times daily PRN     07/29/17 1322       Note:  This document was prepared using Dragon voice recognition software and may include unintentional dictation errors.    Joni Reining, PA-C 07/29/17 1342    Joni Reining, PA-C 07/29/17 1344    Jene Every, MD 07/29/17 1415

## 2017-07-29 NOTE — ED Triage Notes (Signed)
Pt here with c/o left eye redness, swelling and drainage. Pt reports x5 days now. 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

## 2017-08-08 ENCOUNTER — Ambulatory Visit: Payer: Self-pay

## 2017-08-15 ENCOUNTER — Ambulatory Visit: Payer: Self-pay | Admitting: Adult Health Nurse Practitioner

## 2017-08-15 VITALS — BP 133/80 | HR 76 | Temp 98.1°F | Wt 158.9 lb

## 2017-08-15 DIAGNOSIS — E119 Type 2 diabetes mellitus without complications: Secondary | ICD-10-CM

## 2017-08-15 DIAGNOSIS — I10 Essential (primary) hypertension: Secondary | ICD-10-CM

## 2017-08-15 DIAGNOSIS — E785 Hyperlipidemia, unspecified: Secondary | ICD-10-CM

## 2017-08-15 MED ORDER — CARVEDILOL 12.5 MG PO TABS: 12.5000 mg | ORAL_TABLET | Freq: Two times a day (BID) | ORAL | 4 refills | Status: DC

## 2017-08-15 MED ORDER — SIMVASTATIN 20 MG PO TABS: 20.0000 mg | ORAL_TABLET | Freq: Every day | ORAL | 4 refills | Status: DC

## 2017-08-15 MED ORDER — METFORMIN HCL 1000 MG PO TABS: 1000.0000 mg | ORAL_TABLET | Freq: Two times a day (BID) | ORAL | 4 refills | Status: DC

## 2017-08-15 NOTE — Progress Notes (Signed)
   Subjective:    Patient ID: Hector Weber, male    DOB: Nov 02, 1970, 47 y.o.   MRN: [id number]  HPI  Hector Weber is a 47 yo M here for f/u of DM, HTN, and for med refill. He has not seen [place] for a year. He was seen in the ED on 4/22 for eye injury. Pt has not had medications for a few months.  DM: He denies complications.  HTN: BP stable at 133/80. He denies edema. He denies chest pain.   Patient Active Problem List   Diagnosis Date Noted  . Medication monitoring encounter 07/17/2016  . Diabetes (HCC) 02/03/2015  . Essential hypertension 02/03/2015  . Hyperlipidemia LDL goal <100 02/03/2015   Allergies as of 08/15/2017   No Known Allergies     Medication List        Accurate as of 08/15/17  7:26 PM. Always use your most recent med list.          carvedilol 12.5 MG tablet Commonly known as:  COREG TAKE ONE TABLET BY MOUTH TWICE DAILY WITH MEALS.   gentamicin 0.3 % ophthalmic solution Commonly known as:  GARAMYCIN Place 1 drop into the left eye 4 (four) times daily.   metFORMIN 1000 MG tablet Commonly known as:  GLUCOPHAGE Take 1 tablet (1,000 mg total) by mouth 2 (two) times daily with a meal. Reported on 08/30/2015   naphazoline 0.1 % ophthalmic solution Commonly known as:  NAPHCON Place 1 drop into the left eye 4 (four) times daily as needed for eye irritation.   simvastatin 20 MG tablet Commonly known as:  ZOCOR Take 1 tablet (20 mg total) by mouth daily. Reported on 08/30/2015        Review of Systems  All other systems reviewed and are negative.      Objective:   Physical Exam  Constitutional: [name] oriented to person, place, and time. He appears well-developed and well-nourished.  Cardiovascular: Normal rate, regular rhythm and normal heart sounds.  Pulmonary/Chest: Effort normal and breath sounds normal.  Abdominal: Soft. Bowel sounds are normal.  Neurological: [name] alert and oriented to person, place, and time.  Vitals reviewed.   BP 133/80    Pulse 76   Temp 98.1 F (36.7 C)   Wt 158 lb 14.4 oz (72.1 kg)        Assessment & Plan:   Refilled medications.  Routine labs tonight and adjust meds as needed.   HTN:  Controlled.  Goal BP <140/90.  Continue current medication regimen.  Encourage low salt diet and exercise.   DM:  A1c tonight.   Encourage diabetic diet and exercise.  Continue current medication regimen.   HLD:  Lipid panel tonight  Continue current regimen.  Encourage low cholesterol, low fat diet and exercise.      F/u in 3 mo for maintenance.

## 2017-08-16 LAB — CBC
HEMATOCRIT: 44.6 % (ref 37.5–51.0)
Hemoglobin: 16.4 g/dL (ref 13.0–17.7)
MCH: 31.2 pg (ref 26.6–33.0)
MCHC: 36.8 g/dL — AB (ref 31.5–35.7)
MCV: 85 fL (ref 79–97)
Platelets: 235 10*3/uL (ref 150–379)
RBC: 5.25 x10E6/uL (ref 4.14–5.80)
RDW: 12.7 % (ref 12.3–15.4)
WBC: 5.4 10*3/uL (ref 3.4–10.8)

## 2017-08-16 LAB — COMPREHENSIVE METABOLIC PANEL
ALK PHOS: 111 IU/L (ref 39–117)
ALT: 25 IU/L (ref 0–44)
AST: 16 IU/L (ref 0–40)
Albumin/Globulin Ratio: 1.8 (ref 1.2–2.2)
Albumin: 4.9 g/dL (ref 3.5–5.5)
BILIRUBIN TOTAL: 0.9 mg/dL (ref 0.0–1.2)
BUN/Creatinine Ratio: 17 (ref 9–20)
BUN: 12 mg/dL (ref 6–24)
CHLORIDE: 100 mmol/L (ref 96–106)
CO2: 20 mmol/L (ref 20–29)
CREATININE: 0.71 mg/dL — AB (ref 0.76–1.27)
Calcium: 9.5 mg/dL (ref 8.7–10.2)
GFR calc Af Amer: 131 mL/min/{1.73_m2} (ref >59)
GFR calc non Af Amer: 113 mL/min/{1.73_m2} (ref >59)
GLOBULIN, TOTAL: 2.7 g/dL (ref 1.5–4.5)
Glucose: 216 mg/dL — ABNORMAL HIGH (ref 65–99)
Potassium: 3.8 mmol/L (ref 3.5–5.2)
SODIUM: 138 mmol/L (ref 134–144)
Total Protein: 7.6 g/dL (ref 6.0–8.5)

## 2017-08-16 LAB — LIPID PANEL
CHOLESTEROL TOTAL: 245 mg/dL — AB (ref 100–199)
Chol/HDL Ratio: 5.2 ratio — ABNORMAL HIGH (ref 0.0–5.0)
HDL: 47 mg/dL (ref >39)
LDL Calculated: 176 mg/dL — ABNORMAL HIGH (ref 0–99)
Triglycerides: 109 mg/dL (ref 0–149)
VLDL CHOLESTEROL CAL: 22 mg/dL (ref 5–40)

## 2017-08-16 LAB — HEMOGLOBIN A1C
ESTIMATED AVERAGE GLUCOSE: 232 mg/dL
HEMOGLOBIN A1C: 9.7 % — AB (ref 4.8–5.6)

## 2017-08-16 LAB — TSH: TSH: 0.724 u[IU]/mL (ref 0.450–4.500)

## 2017-08-16 LAB — MICROALBUMIN / CREATININE URINE RATIO: CREATININE, UR: 60.5 mg/dL

## 2017-09-12 ENCOUNTER — Telehealth: Payer: Self-pay

## 2017-09-12 NOTE — Telephone Encounter (Signed)
Sent a message to MiltonJacqui to call pt with results. Pt is spanish speaking. Scheduled and appt for next Tues when translator will be here.

## 2017-09-12 NOTE — Telephone Encounter (Signed)
-----   Message from Jacelyn Pieah Doles-Johnson, NP sent at 09/05/2017  6:19 PM EDT ----- Have patient come in for review of lab results and adjustment of DM medications.

## 2017-09-17 ENCOUNTER — Ambulatory Visit: Payer: Self-pay

## 2017-09-19 ENCOUNTER — Ambulatory Visit: Payer: Self-pay

## 2017-09-26 ENCOUNTER — Ambulatory Visit: Payer: Self-pay | Admitting: Adult Health Nurse Practitioner

## 2017-09-26 VITALS — BP 139/75 | HR 101 | Temp 98.0°F | Ht 62.75 in | Wt 158.4 lb

## 2017-09-26 DIAGNOSIS — E119 Type 2 diabetes mellitus without complications: Secondary | ICD-10-CM

## 2017-09-26 MED ORDER — METFORMIN HCL 1000 MG PO TABS: 1000.0000 mg | ORAL_TABLET | Freq: Two times a day (BID) | ORAL | 4 refills | Status: DC

## 2017-09-26 MED ORDER — CARVEDILOL 12.5 MG PO TABS: 12.5000 mg | ORAL_TABLET | Freq: Two times a day (BID) | ORAL | 4 refills | Status: DC

## 2017-09-26 MED ORDER — SIMVASTATIN 20 MG PO TABS: 20.0000 mg | ORAL_TABLET | Freq: Every day | ORAL | 4 refills | Status: DC

## 2017-09-26 NOTE — Patient Instructions (Signed)
Diabetes Mellitus and Nutrition When you have diabetes (diabetes mellitus), it is very important to have healthy eating habits because your blood sugar (glucose) levels are greatly affected by what you eat and drink. Eating healthy foods in the appropriate amounts, at about the same times every day, can help you:  Control your blood glucose.  Lower your risk of heart disease.  Improve your blood pressure.  Reach or maintain a healthy weight.  Every person with diabetes is different, and each person has different needs for a meal plan. Your health care provider may recommend that you work with a diet and nutrition specialist (dietitian) to make a meal plan that is best for you. Your meal plan may vary depending on factors such as:  The calories you need.  The medicines you take.  Your weight.  Your blood glucose, blood pressure, and cholesterol levels.  Your activity level.  Other health conditions you have, such as heart or kidney disease.  How do carbohydrates affect me? Carbohydrates affect your blood glucose level more than any other type of food. Eating carbohydrates naturally increases the amount of glucose in your blood. Carbohydrate counting is a method for keeping track of how many carbohydrates you eat. Counting carbohydrates is important to keep your blood glucose at a healthy level, especially if you use insulin or take certain oral diabetes medicines. It is important to know how many carbohydrates you can safely have in each meal. This is different for every person. Your dietitian can help you calculate how many carbohydrates you should have at each meal and for snack. Foods that contain carbohydrates include:  Bread, cereal, rice, pasta, and crackers.  Potatoes and corn.  Peas, beans, and lentils.  Milk and yogurt.  Fruit and juice.  Desserts, such as cakes, cookies, ice cream, and candy.  How does alcohol affect me? Alcohol can cause a sudden decrease in blood  glucose (hypoglycemia), especially if you use insulin or take certain oral diabetes medicines. Hypoglycemia can be a life-threatening condition. Symptoms of hypoglycemia (sleepiness, dizziness, and confusion) are similar to symptoms of having too much alcohol. If your health care provider says that alcohol is safe for you, follow these guidelines:  Limit alcohol intake to no more than 1 drink per day for nonpregnant women and 2 drinks per day for men. One drink equals 12 oz of beer, 5 oz of wine, or 1 oz of hard liquor.  Do not drink on an empty stomach.  Keep yourself hydrated with water, diet soda, or unsweetened iced tea.  Keep in mind that regular soda, juice, and other mixers may contain a lot of sugar and must be counted as carbohydrates.  What are tips for following this plan? Reading food labels  Start by checking the serving size on the label. The amount of calories, carbohydrates, fats, and other nutrients listed on the label are based on one serving of the food. Many foods contain more than one serving per package.  Check the total grams (g) of carbohydrates in one serving. You can calculate the number of servings of carbohydrates in one serving by dividing the total carbohydrates by 15. For example, if a food has 30 g of total carbohydrates, it would be equal to 2 servings of carbohydrates.  Check the number of grams (g) of saturated and trans fats in one serving. Choose foods that have low or no amount of these fats.  Check the number of milligrams (mg) of sodium in one serving. Most people   should limit total sodium intake to less than 2,300 mg per day.  Always check the nutrition information of foods labeled as "low-fat" or "nonfat". These foods may be higher in added sugar or refined carbohydrates and should be avoided.  Talk to your dietitian to identify your daily goals for nutrients listed on the label. Shopping  Avoid buying canned, premade, or processed foods. These  foods tend to be high in fat, sodium, and added sugar.  Shop around the outside edge of the grocery store. This includes fresh fruits and vegetables, bulk grains, fresh meats, and fresh dairy. Cooking  Use low-heat cooking methods, such as baking, instead of high-heat cooking methods like deep frying.  Cook using healthy oils, such as olive, canola, or sunflower oil.  Avoid cooking with butter, cream, or high-fat meats. Meal planning  Eat meals and snacks regularly, preferably at the same times every day. Avoid going long periods of time without eating.  Eat foods high in fiber, such as fresh fruits, vegetables, beans, and whole grains. Talk to your dietitian about how many servings of carbohydrates you can eat at each meal.  Eat 4-6 ounces of lean protein each day, such as lean meat, chicken, fish, eggs, or tofu. 1 ounce is equal to 1 ounce of meat, chicken, or fish, 1 egg, or 1/4 cup of tofu.  Eat some foods each day that contain healthy fats, such as avocado, nuts, seeds, and fish. Lifestyle   Check your blood glucose regularly.  Exercise at least 30 minutes 5 or more days each week, or as told by your health care provider.  Take medicines as told by your health care provider.  Do not use any products that contain nicotine or tobacco, such as cigarettes and e-cigarettes. If you need help quitting, ask your health care provider.  Work with a Veterinary surgeoncounselor or diabetes educator to identify strategies to manage stress and any emotional and social challenges. What are some questions to ask my health care provider?  Do I need to meet with a diabetes educator?  Do I need to meet with a dietitian?  What number can I call if I have questions?  When are the best times to check my blood glucose? Where to find more information:  American Diabetes Association: diabetes.org/food-and-fitness/food  Academy of Nutrition and Dietetics:  https://www.vargas.com/www.eatright.org/resources/health/diseases-and-conditions/diabetes  [employer] Institute of Diabetes and Digestive and Kidney Diseases (NIH): FindJewelers.czwww.niddk.nih.gov/health-information/diabetes/overview/diet-eating-physical-activity Summary  A healthy meal plan will help you control your blood glucose and maintain a healthy lifestyle.  Working with a diet and nutrition specialist (dietitian) can help you make a meal plan that is best for you.  Keep in mind that carbohydrates and alcohol have immediate effects on your blood glucose levels. It is important to count carbohydrates and to use alcohol carefully. This information is not intended to replace advice given to you by your health care provider. Make sure you discuss any questions you have with your health care provider. Document Released: 12/21/2004 Document Revised: 04/30/2016 Document Reviewed: 04/30/2016     O diabetes mellitus e a nutrio Diabetes Mellitus and Nutrition Quando voc sofre de diabetes (diabetes mellitus),  muito importante ter hbitos de alimentao saudveis, uma vez que os seus nveis de acar no sangue (glicose) so significativamente afetados pelo que voc come e bebe. Comer alimentos saudveis nas quantidades apropriadas e aproximadamente nos mesmos horrios todos os dias pode ajudar voc a:  Controlar seu nvel de glicose sangunea.  Reduzir seu risco de doena cardaca.  Melhorar sua presso  arterial.  Alcanar ou manter um peso saudvel.  Todas as pessoas com diabetes so diferentes, e cada pessoa tem diferentes necessidades em termos de um plano de refeies. Seu mdico poder recomendar que voc colabore com um especialista em nutrio e dieta (nutricionista) para elaborar o melhor plano de refeies para voc. Seu plano de refeies poder variar dependendo de fatores como:  As calorias de que voc necessita.  Os medicamentos que toma.  Seu peso.  Seus nveis de glicose sangunea, presso arterial e  colesterol.  Seu nvel de atividade.  Outros quadros clnicos, como doena cardaca ou renal.  Como os carboidratos me afetam? Os carboidratos afetam o nvel de sua glicose sangunea mais do que qualquer outro tipo de [place]. A ingesto de carboidratos naturalmente aumenta a quantidade de glicose no sangue. A contagem de carboidratos  um mtodo para acompanhar a quantidade de carboidratos ingerida por voc. A contagem de carboidratos  importante para manter sua glicose sangunea em um nvel saudvel, especialmente se voc usar insulina ou tomar certos medicamentos orais para o diabetes mellitus.  importante saber quantos carboidratos voc pode ingerir com segurana em cada refeio. Isso varia de pessoa para pessoa. Seu nutricionista poder ajudar voc a calcular quantos carboidratos voc precisa obter em cada refeio e como lanche. Alimentos que contm carboidratos incluem:  Po, cereais, arroz, massas e bolachas.  Batatas e milho.  Ervilhas, feijo e lentilhas.  Leite e iogurte.  Frutas e sucos.  Sobremesas, como bolos, biscoitos, sorvete e doces.  Como o lcool me afeta? O lcool pode causar uma sbita reduo do nvel de glicose sangunea (hipoglicemia), especialmente se voc usar insulina ou tomar certos medicamentos orais para o diabetes. A hipoglicemia pode ser um quadro clnico potencialmente fatal. Os sintomas de hipoglicemia (sonolncia, vertigem e confuso) so parecidos com os sintomas da ingesto abusiva de lcool. Caso seu mdico diga que o lcool  seguro para voc, siga essas orientaes:  Limite o consumo de bebidas alcolicas a, no mximo, 1 dose por dia para mulheres no grvidas e 2 doses por dia para homens. Uma dose equivale a 12 onas de cerveja, 5 onas de vinho ou 1 ona de bebida destilada.  No beba de estmago vazio.  Mantenha sua hidratao com gua, refrigerante diet ou ch gelado sem acar.  Tenha em mente que o refrigerante normal, suco e  outras misturas podem conter [employer] acar e devem ser contadas como carboidratos.  Quais so as dicas para seguir este plano? Leia os rtulos de alimentos  Comece verificando o Musician poro no rtulo. A quantidade de calorias, carboidratos, gorduras e outros nutrientes listados no rtulo se baseia em uma poro do alimento. Muitos alimentos contm mais de uma poro por embalagem.  Verifique o total de gramas (g) de carboidratos em uma poro. Voc pode calcular o nmero de pores de carboidratos em uma poro dividindo o total de carboidratos por 15. Por exemplo: supondo que um alimento contenha no total 30 g de carboidratos, isso seria igual a 2 pores de carboidratos.  Verifique o nmero de gramas (g) de gorduras saturadas e trans em uma poro. Escolha alimentos com pouca ou nenhuma quantidade dessas gorduras.  Verifique o nmero de miligramas (mg) de sdio em uma poro. A maioria das pessoas deve limitar o consumo de sdio a 2.300 mg por dia.  Sempre verifique as informaes nutricionais dos alimentos rotulados como de "baixo teor de gordura" e "gordura zero". Esses alimentos podem conter elevado teor de acar ou carboidratos refinados e devem  ser evitados.  Converse com seu nutricionista para identificar suas metas dirias dos nutrientes listados na tabela. No mercado  Evite comprar alimentos enlatados, semiprontos ou processados. Esses alimentos tendem a conter elevado teor de gordura, sdio e acares adicionados.  Escolha itens das reas mais externas da seo de alimentos. Ela inclui frutas e verduras frescas, cereais a granel, carnes frescas e produtos avirios frescos. Na cozinha  Use mtodos de cozimento de baixo calor, como assar, em vez de mtodos de cozimento de Musician, como fritura por imerso.  Cozinhe com leos saudveis para o corao, como de [place], canola ou [place].  Evite cozinhar com manteiga, creme ou carnes com elevado teor de [place]. O  planejamento das refeies  Consuma refeies e lanches regularmente, de preferncia nos mesmos horrios todos os dias. Evite ficar longos perodos sem comer.  Coma alimentos ricos em [place], como frutas e verduras frescas, feijo e gros integrais. Converse com seu nutricionista sobre quantas pores de carboidratos voc pode comer em cada refeio.  Coma 4-6 onas de protena magra por dia, como carne magra, frango, peixe, ovos ou tofu. 1 ona  igual a 1 ona de carne, frango ou peixe, 1 ovo ou 1/4 de xcara de tofu.  Coma alguns alimentos todos os dias que contenham gorduras saudveis, como abacate, nozes, sementes e peixe. Estilo de vida   Verifique sua glicose sangunea regularmente.  Exercite-se por pelo menos 30 minutos em 5 dias da semana ou mais ou pelo tempo determinado pelo seu mdico.  Tome medicamentos somente de acordo com as orientaes do seu mdico.  No use nenhum produto que contenha nicotina ou tabaco, como cigarros e cigarros eletrnicos. Caso precise de ajuda para parar de fumar, fale com seu mdico.  Consulte-se com um especialista em diabetes para identificar estratgias para lidar com o estresse e quaisquer desafios emocionais ou sociais. Quais so [employer] para fazer ao meu mdico?  Preciso me consultar com um especialista em diabetes?  Preciso me consultar com um nutricionista?  Para que nmero devo ligar se tiver dvidas?  Quais so os melhores horrios para verificar minha glicose sangunea? Onde conseguir mais informaes:  American Diabetes Association (Associao Americana do Diabetes): diabetes.org/food-and-fitness/food  Academy of Nutrition and Dietetics (Academia de Nutrio e Dieta): https://www.vargas.com/  [employer] of Diabetes and Digestive and Kidney Diseases (NIH) [employer] de Diabetes e Doenas Digestivas e Renais):  FindJewelers.cz Resumo  Um plano de refeies saudveis ajudar voc a controlar sua glicose sangunea e a manter um estilo de vida saudvel.  Consultar um especialista em nutrio (nutricionista) poder ajudar na elaborao do melhor plano para voc.  Tenha em mente que carboidratos e lcool tm efeitos imediatos sobre seus nveis de glicose sangunea.  importante contar os carboidratos e consumir lcool com cuidado. Estas informaes no se destinam a substituir as recomendaes de seu mdico. No deixe de discutir quaisquer dvidas com seu mdico. Document Released: 07/18/2015 Document Revised: 07/26/2016 Document Reviewed: 07/26/2016 Elsevier Interactive Patient Education  2018 [employer].     Risk analyst Patient Education  [employer].

## 2017-09-26 NOTE — Progress Notes (Signed)
  Patient: Hector Weber Male    DOB: 09-Jul-1970   47 y.o.   MRN: [id number] Visit Date: 09/26/2017  Today's Provider: [facility]   Chief Complaint  Patient presents with  . Follow-up    Diabetes, cough   Subjective:    HPI  Here to discuss labwork.  Discussed A1c has increased to 9.7 from 7.6.  He would like time to try to get it down before additional medications added.     No Known Allergies Previous Medications   CARVEDILOL (COREG) 12.5 MG TABLET    Take 1 tablet (12.5 mg total) by mouth 2 (two) times daily with a meal.   METFORMIN (GLUCOPHAGE) 1000 MG TABLET    Take 1 tablet (1,000 mg total) by mouth 2 (two) times daily with a meal. Reported on 08/30/2015   NAPHAZOLINE (NAPHCON) 0.1 % OPHTHALMIC SOLUTION    Place 1 drop into the left eye 4 (four) times daily as needed for eye irritation.   SIMVASTATIN (ZOCOR) 20 MG TABLET    Take 1 tablet (20 mg total) by mouth daily. Reported on 08/30/2015    Review of Systems  All other systems reviewed and are negative.   Social History   Tobacco Use  . Smoking status: Current Some Day Smoker    Types: Cigarettes  . Smokeless tobacco: Current User    Types: Snuff, Chew  Substance Use Topics  . Alcohol use: Yes    Alcohol/week: 3.6 oz    Types: 6 Cans of beer per week    Comment: 6 pack/month   Objective:   BP 139/75   Pulse (!) 101   Temp 98 F (36.7 C)   Ht 5' 2.75" (1.594 m)   Wt 158 lb 6.4 oz (71.8 kg)   BMI 28.28 kg/m   Physical Exam  Constitutional: He appears well-developed and well-nourished.  HENT:  Head: Normocephalic and atraumatic.  Cardiovascular: Regular rhythm and normal heart sounds.  Pulmonary/Chest: Effort normal and breath sounds normal.  Abdominal: Soft. Bowel sounds are normal.  Vitals reviewed.       Assessment & Plan:         DM:  Not controlled.  Encourage strict diabetic diet and exercise in attempt to lower A1c.  Discussed drinking low calorie beers or cutting back.   Continue current medication regimen.  If not improved on next OV will add another agent.   Labs a week prior to OV in August.               [facility]   [facility]

## 2017-11-07 ENCOUNTER — Other Ambulatory Visit: Payer: Self-pay

## 2017-11-07 DIAGNOSIS — E119 Type 2 diabetes mellitus without complications: Secondary | ICD-10-CM

## 2017-11-08 LAB — HEMOGLOBIN A1C
ESTIMATED AVERAGE GLUCOSE: 154 mg/dL
Hgb A1c MFr Bld: 7 % — ABNORMAL HIGH (ref 4.8–5.6)

## 2017-11-08 LAB — COMPREHENSIVE METABOLIC PANEL
ALBUMIN: 4.8 g/dL (ref 3.5–5.5)
ALK PHOS: 99 IU/L (ref 39–117)
ALT: 34 IU/L (ref 0–44)
AST: 17 IU/L (ref 0–40)
Albumin/Globulin Ratio: 2 (ref 1.2–2.2)
BILIRUBIN TOTAL: 0.4 mg/dL (ref 0.0–1.2)
BUN / CREAT RATIO: 20 (ref 9–20)
BUN: 13 mg/dL (ref 6–24)
CO2: 23 mmol/L (ref 20–29)
Calcium: 9.5 mg/dL (ref 8.7–10.2)
Chloride: 99 mmol/L (ref 96–106)
Creatinine, Ser: 0.65 mg/dL — ABNORMAL LOW (ref 0.76–1.27)
GFR calc Af Amer: 136 mL/min/{1.73_m2} (ref >59)
GFR calc non Af Amer: 117 mL/min/{1.73_m2} (ref >59)
Globulin, Total: 2.4 g/dL (ref 1.5–4.5)
Glucose: 124 mg/dL — ABNORMAL HIGH (ref 65–99)
Potassium: 4.8 mmol/L (ref 3.5–5.2)
Sodium: 138 mmol/L (ref 134–144)
Total Protein: 7.2 g/dL (ref 6.0–8.5)

## 2017-11-14 ENCOUNTER — Ambulatory Visit: Payer: Self-pay | Admitting: Family Medicine

## 2017-11-14 VITALS — BP 129/82 | HR 91 | Temp 98.0°F | Ht 64.57 in | Wt 158.7 lb

## 2017-11-14 DIAGNOSIS — E119 Type 2 diabetes mellitus without complications: Secondary | ICD-10-CM

## 2017-11-14 NOTE — Progress Notes (Signed)
BP 129/82   Pulse 91   Temp 98 F (36.7 C)   Ht 5' 4.57" (1.64 m)   Wt 158 lb 11.2 oz (72 kg)   BMI 26.76 kg/m    Subjective:    Patient ID: Hector Weber, male    DOB: 10-14-1970, 47 y.o.   MRN: [id number]  HPI: Hector Weber is a 47 y.o. male  Chief Complaint  Patient presents with  . Diabetes  . Follow-up    Lab results   DIABETES- last seen 6 weeks ago. A1c had dropped from 9.7 to 7.6, has been avoiding fried food and eating healthy. Has been feeling much better. Hypoglycemic episodes:no Polydipsia/polyuria: no Visual disturbance: no Chest pain: no Paresthesias: no Glucose Monitoring: occasionally  Accucheck frequency: occasionally Taking Insulin?: no Blood Pressure Monitoring: not checking  Relevant past medical, surgical, family and social history reviewed and updated as indicated. Interim medical history since our last visit reviewed. Allergies and medications reviewed and updated.  Review of Systems  Constitutional: Negative.   Respiratory: Negative.   Cardiovascular: Negative.   Neurological: Negative.   Psychiatric/Behavioral: Negative.     Per HPI unless specifically indicated above     Objective:    BP 129/82   Pulse 91   Temp 98 F (36.7 C)   Ht 5' 4.57" (1.64 m)   Wt 158 lb 11.2 oz (72 kg)   BMI 26.76 kg/m   Wt Readings from Last 3 Encounters:  11/14/17 158 lb 11.2 oz (72 kg)  09/26/17 158 lb 6.4 oz (71.8 kg)  08/15/17 158 lb 14.4 oz (72.1 kg)    Physical Exam  Constitutional: [name] oriented to person, place, and time. He appears well-developed and well-nourished. No distress.  HENT:  Head: Normocephalic and atraumatic.  Right Ear: Hearing normal.  Left Ear: Hearing normal.  Nose: Nose normal.  Eyes: Conjunctivae and lids are normal. Right eye exhibits no discharge. Left eye exhibits no discharge. No scleral icterus.  Pulmonary/Chest: Effort normal. No respiratory distress.  Musculoskeletal: Normal range of motion.   Neurological: [name] alert and oriented to person, place, and time.  Skin: Skin is intact. No rash noted.  Psychiatric: He has a normal mood and affect. His speech is normal and behavior is normal. Judgment and thought content normal. Cognition and memory are normal.    Results for orders placed or performed in visit on 11/07/17  Comp Met (CMET)  Result Value Ref Range   Glucose 124 (H) 65 - 99 mg/dL   BUN 13 6 - 24 mg/dL   Creatinine, Ser 0.65 (L) 0.76 - 1.27 mg/dL   GFR calc non Af Amer 117 >59 mL/min/1.73   GFR calc Af Amer 136 >59 mL/min/1.73   BUN/Creatinine Ratio 20 9 - 20   Sodium 138 134 - 144 mmol/L   Potassium 4.8 3.5 - 5.2 mmol/L   Chloride 99 96 - 106 mmol/L   CO2 23 20 - 29 mmol/L   Calcium 9.5 8.7 - 10.2 mg/dL   Total Protein 7.2 6.0 - 8.5 g/dL   Albumin 4.8 3.5 - 5.5 g/dL   Globulin, Total 2.4 1.5 - 4.5 g/dL   Albumin/Globulin Ratio 2.0 1.2 - 2.2   Bilirubin Total 0.4 0.0 - 1.2 mg/dL   Alkaline Phosphatase 99 39 - 117 IU/L   AST 17 0 - 40 IU/L   ALT 34 0 - 44 IU/L  HgB A1c  Result Value Ref Range   Hgb A1c MFr  Bld 7.0 (H) 4.8 - 5.6 %   Est. average glucose Bld gHb Est-mCnc 154 mg/dL      Assessment & Plan:   Problem List Items Addressed This Visit      Endocrine   Diabetes ([place]) - Primary (Chronic)    Doing much better with A1c down to 7.0 from 9.7! Continue current regimen and continue to monitor. Recheck 3 months with labs before.      Relevant Orders   Hgb A1c w/o eAG   Comprehensive metabolic panel   Lipid Panel w/o Chol/HDL Ratio       Follow up plan: Return in about 3 months (around 02/14/2018) for follow up diabetes with labs 1 week before. Marland Kitchen

## 2017-11-14 NOTE — Assessment & Plan Note (Addendum)
Doing much better with A1c down to 7.0 from 9.7! Continue current regimen and continue to monitor. Recheck 3 months with labs before.

## 2017-11-14 NOTE — Patient Instructions (Signed)
Diabetes mellitus y nutricin Diabetes Mellitus and Nutrition Si sufre de diabetes (diabetes mellitus), es muy importante tener hbitos alimenticios saludables debido a que sus niveles de azcar en la sangre (glucosa) se ven afectados en gran medida por lo que come y bebe. [place] alimentos saludables en las cantidades adecuadas, aproximadamente a la misma hora todos los das, lo ayudar a:  Controlar la glucemia.  Disminuir el riesgo de sufrir una enfermedad cardaca.  Mejorar la presin arterial.  Alcanzar o mantener un peso saludable.  Todas las personas que sufren de diabetes son diferentes y cada una tiene necesidades diferentes en cuanto a un plan de alimentacin. El mdico puede recomendarle que trabaje con un especialista en dietas y nutricin (nutricionista) para elaborar el mejor plan para usted. Su plan de alimentacin puede variar segn factores como:  Las caloras que necesita.  Los medicamentos que toma.  Su peso.  Sus niveles de glucemia, presin arterial y colesterol.  Su nivel de actividad.  Otras afecciones que tenga, como enfermedades cardacas o renales.  Cmo me afectan los carbohidratos? Los carbohidratos afectan el nivel de glucemia ms que cualquier otro tipo de alimento. La ingesta de carbohidratos naturalmente aumenta la cantidad glucosa en la sangre. El recuento de carbohidratos es un mtodo destinado a llevar un registro de la cantidad de carbohidratos que se ingieren. El recuento de carbohidratos es importante para mantener la glucemia a un nivel saludable, en especial si utiliza insulina o toma determinados medicamentos por va oral para la diabetes. Es importante saber la cantidad de carbohidratos que se pueden ingerir en cada comida sin correr ningn riesgo. Esto es diferente en cada persona. El nutricionista puede ayudarlo a calcular la cantidad de carbohidratos que debe ingerir en cada comida y colacin. Los alimentos que contienen carbohidratos  incluyen:  Pan, cereal, arroz, pasta y galletas.  Papas y maz.  Guisantes, frijoles y lentejas.  Leche y yogur.  Frutas y jugo.  Postres, como pasteles, galletitas, helado y caramelos.  Cmo me afecta el alcohol? El alcohol puede provocar disminuciones sbitas de la glucemia (hipoglucemia), en especial si utiliza insulina o toma determinados medicamentos por va oral para la diabetes. La hipoglucemia es una afeccin potencialmente mortal. Los sntomas de la hipoglucemia (somnolencia, mareos y confusin) son similares a los sntomas de haber consumido demasiado alcohol. Si el mdico afirma que el alcohol es seguro para usted, siga estas pautas:  Limite el consumo de alcohol a no ms de 1 medida por da si es mujer y no est embarazada, y a 2 medidas si es hombre. Una medida equivale a 12oz (355ml) de cerveza, 5oz (148ml) de vino o 1oz (44ml) de bebidas de alta graduacin alcohlica.  No beba con el estmago vaco.  Mantngase hidratado con agua, gaseosas dietticas o t helado sin azcar.  Tenga en cuenta que las gaseosas comunes, los jugos y otros refrescos pueden contener mucha azcar y se deben contar como carbohidratos.  Consejos para seguir este plan Leer las etiquetas de los alimentos  Comience por controlar el tamao de la porcin en la etiqueta. La cantidad de caloras, carbohidratos, grasas y otros nutrientes mencionados en la etiqueta se basan en una porcin del alimento. Muchos alimentos contienen ms de una porcin por envase.  Verifique la cantidad total de gramos (g) de carbohidratos totales en una porcin. Puede calcular la cantidad de porciones de carbohidratos al dividir el total de carbohidratos por 15. Por ejemplo, si un alimento posee un total de 30g de carbohidratos, equivale a 2 porciones   de carbohidratos.  Verifique la cantidad de gramos (g) de grasas saturadas y grasas trans en una porcin. Escoja alimentos que no contengan grasa o que tengan un bajo  contenido.  Controle la cantidad de miligramos (mg) de sodio en una porcin. La [employer] de las personas deben limitar la ingesta de sodio total a menos de 2321m por dTraining and development officer  Siempre consulte la informacin nutricional de los alimentos etiquetados como "con bajo contenido de grasa" o "sin grasa". Estos alimentos pueden ser ms altos en azcar agregada o en carbohidratos refinados y deben evitarse.  Hable con el nutricionista para identificar sus objetivos diarios en cuanto a los nutrientes mencionados en la etiqueta. De compras  Evite comprar alimentos procesados, enlatados o prehechos. Estos alimentos tienden a tTEFL teachercantidad de gBethel sodio y azcar agregada.  Compre en la zona exterior de la tienda de comestibles. Esta incluye frutas y vNorthrop Grumman granos a granel, carnes frescas y productos lcteos frescos. Coccin  Utilice mtodos de coccin a baja temperatura, como hornear, en lugar de mtodos de coccin a alta temperatura, como frer en abundante aceite.  Cocine con aceites saludables, como el aceite de oAlva canola o g[place]  Evite cocinar con manteca, crema o carnes con alto contenido de grasa. Planificacin de las comidas  CInternational Papercomidas y las colaciones de forma regular, preferentemente a la misma hora todos lH[place] Evite pasar largos perodos de tiempo sin comer.  Consuma alimentos ricos en fibra, como frutas frescas, verduras, frijoles y cereales integrales. Consulte al nutricionista sobre cuntas porciones de carbohidratos puede consumir en cada comida.  Consuma entre 4 y 6 onzas de protenas magras por da, como carnes mArgyle pollo, pescado, hFirst Data Corporationo tofu. 1 onza equivale a 1 onza de carne, pollo o pescado, 1 huevo, o 1/4 taza de tofu.  Coma algunos alimentos por da que contengan grasas saludables, como aguacates, frutos secos, semillas y pescado. Estilo de vida   Controle su nivel de glucemia con regularidad.  Haga ejercicio al menos 366mutos,  5das o ms por semana, o como se lo haya indicado el mdico.  Tome los meTenneco Ince lo haya indicado el mdico.  No consuma ningn producto que contenga nicotina o tabaco, como cigarrillos y ciPsychologist, sport and exerciseSi necesita ayuda para dejar de fumar, consulte al mdHess Corporationon un asesor o instructor en diabetes para identificar estrategias para controlar el estrs y cualquier desafo emocional y social. Cules son algunas de las preguntas que puedo hacerle a mi mdico?  Es necesario que me rena con unRadio broadcast assistantn diabetes?  Es necesario que me rena con un nutricionista?  A qu nmero puedo llamar si tengo preguntas?  Cules son los mejores momentos para controlar la glucemia? Dnde encontrar ms informacin:  Asociacin Americana de la Diabetes (American Diabetes Association): diabetes.org/food-and-fitness/food  Academia de Nutricin y DiInformation systems managerAcademy of Nutrition and Dietetics): wwPokerClues.dkInNew Riegeliabetes y laWatertown Town ReWater quality scientistNMedstar Saint Mary'S Hospitalf Diabetes and Digestive and Kidney Diseases) (InGulkanaNIH): wwContactWire.beesumen  Un plan de alimentacin saludable lo ayudar a coAeronautical engineerlucemia y maTheatre managern estilo de vida saludable.  Trabajar con un especialista en dietas y nutricin (nutricionista) puede ayudarlo a elInsurance claims handlere alimentacin para usted.  Tenga en cuenta que los carbohidratos y el alcohol tienen efectos inmediatos en sus niveles de glucemia. Es importante contar los carbohidratos y consumir alcohol con prudencia. Esta informacin no tiene coMarine scientistl consejo del  mdico. Asegrese de hacerle al mdico cualquier pregunta que tenga. Document Released: 07/03/2007 Document Revised: 07/16/2016 Document Reviewed:  07/16/2016 Elsevier Interactive Patient Education  2018 Elsevier Inc.  Cmo evitar los problemas relacionados con la diabetes mellitus How to Avoid Diabetes Mellitus Problems Usted puede actuar para prevenir o disminuir los problemas causados por la diabetes (diabetes mellitus). Seguir un plan para la diabetes y cuidarse usted mismo puede reducir el riesgo de complicaciones graves o potencialmente mortales. Controle su diabetes  Siga las indicaciones del mdico acerca de cmo tratar la diabetes. Su diabetes puede ser tratada por un equipo de profesionales de la salud que le pueden ensear a cuidarse y pueden responder las preguntas que tenga.  Instryase sobre su afeccin para tomar decisiones saludables en relacin a los alimentos y la actividad fsica.  Contrlese la glucemia (glucosa en la sangre) con la frecuencia que le hayan indicado. El Conservation officer, historic buildings a decidir con qu frecuencia debe revisar su nivel de glucosa en la sangre, en funcin de los objetivos de su tratamiento y el xito en cumplirlos.  Consltele a su mdico si debe tomar aspirina en dosis bajas a diario y cul es la dosis recomendada para usted. Tomar aspirina en dosis bajas a diario se recomienda para ayudar a prevenir la enfermedad cardiovascular. No consuma nicotina ni tabaco No consuma ningn producto que contenga nicotina o tabaco, como cigarrillos y cigarrillos electrnicos. Si necesita ayuda para dejar de fumar, consulte al mdico. La nicotina aumenta el riesgo de problemas con la diabetes. Si deja de consumir nicotina:  Se reduce el riesgo de ataque cardaco, accidente cerebrovascular, enfermedades nerviosas y enfermedad renal.  Pueden mejorar el colesterol y sus niveles de presin arterial.  La circulacin sangunea mejorar.  Mantenga su presin arterial bajo control Para controlar la presin arterial:  Siga las indicaciones del mdico sobre la planificacin de las comidas, el ejercicio y los  medicamentos.  Asegrese de que el [employer] mida la presin arterial en cada visita mdica.  La medicin de la presin arterial consiste de [employer]. En general, el objetivo es mantener el nmero de [place] (presin sistlica) en un valor mximo de 130 y el nmero de abajo (presin diastlica) en un valor mximo de 80. El mdico puede recomendarle un objetivo de presin arterial con valores ms bajos. Su objetivo de presin arterial individualizado se determina sobre la base de:  La edad.  Los medicamentos.  El tiempo transcurrido desde que tiene diabetes.  Otras enfermedades que padezca.  Mantenga los niveles de colesterol bajo control Para controlar su colesterol:  Siga las indicaciones del mdico sobre la planificacin de las comidas, el ejercicio y los medicamentos.  Controle su nivel de colesterol por lo menos una vez al ao.  Es posible que le receten medicamentos para bajar sus niveles de colesterol (estatina). Si no est tomando una estatina, pregntele a su mdico si debera tomarla.  Si controla su colesterol, podra:  Ayudarle a prevenir enfermedades del corazn y un accidente cerebrovascular. Estos son los problemas de salud ms comunes para las personas con diabetes.  Mejorar el flujo sanguneo.  Planifique y cumpla con sus exmenes fsicos y oculares anuales Su mdico le informar con qu frecuencia debe atenderse, dependiendo de su plan de control de la diabetes. Concurra a todas las visitas de control como se le haya indicado. Esto es importante para identificar rpidamente posibles problemas y poder evitar o tratar las complicaciones.  Cada visita a su mdico deber incluir la medicin de: ?  El [place]. ? La presin arterial. ? La glucemia.  El nivel de A1c (hemoglobina A1c) debe controlarse: ? Al menos 2 veces al ao si cumple los objetivos del tratamiento. ? [employer], si no cumple los [address] del tratamiento o si sus objetivos [place].  Los lpidos de la sangre (perfil lipdico) deben controlarse anualmente. Tambin hay que controlar anualmente la presencia de protenas en la orina (microalbuminuria).  Si tiene diabetes tipo1, hgase un examen ocular en el trmino de 3 a 5aos despus del diagnstico y, luego, Neomia Dear vez al ao despus del Risk manager.  Si tiene diabetes tipo2, hgase un examen ocular tan pronto como le diagnostiquen la enfermedad y, [place], una vez por ao despus del Risk manager.  Mantngase al da con las vacunas Se recomienda que reciba:  [place] antigripal (influenza) todos los [place].  Vacuna contra la neumona (vacuna antineumoccica) y contra la hepatitisB. Si tiene 65aos o ms, puede recibir Transport planner como una serie de dos inyecciones [place].  Pregntele al mdico qu otras vacunas se pueden recomendar. Cuide sus pies La diabetes puede hacer que la circulacin sangunea en las piernas y los pies sea deficiente. Por eso, el cuidado de los pies es muy importante. La diabetes puede provocar:  Que la piel de los pies se vuelva ms fina, se rompa ms fcilmente y cicatrice ms lento.  Dao nervioso en las piernas y los pies, lo que puede Forensic scientist en disminucin de la sensibilidad. Es posible que no advierta las heridas ms pequeas que pueden conducir a Systems developer graves.  Para evitar problemas en los pies:  Examnese a diario la piel y los pies en busca de cortes, moretones, enrojecimiento, ampollas o llagas.  Programe una cita para que el [employer] controle los pies una vez por ao. Este examen incluye: ? Inspeccionar la estructura y la piel de sus pies. ? Revisar los pulsos y sensaciones de sus pies.  Asegrese de que su mdico realice un examen visual de los pies en cada visita mdica.  Cuide sus [employer] con diabetes mal controlada son ms propensas a Immunologist (periodontales). La diabetes puede hacer que las enfermedades  periodontales sean ms difciles de Chief Operating Officer. Las [employer], si se dejan sin tratamiento, pueden conducir a la prdida de dientes. Para prevenirlas:  Cepllese los [employer].  Use hilo dental al menos una vez al da.  Visite al [employer] por ao.  Beba de manera responsable Limite el consumo de alcohol a no ms de 1 medida por da si es mujer y no est Orthoptist y a 2 medidas por da si es hombre. Una medida equivale a 12onzas de cerveza, 5onzas de vino o 1onzas de bebidas alcohlicas de alta graduacin. Es importante que consuma alimentos cuando bebe alcohol para evitar la glucemia baja (hipoglucemia). Evite consumir alcohol si:  Tiene antecedentes de consumo excesivo o dependencia de alcohol.  Est embarazada.  Tiene enfermedad heptica, pancreatitis, neuropata avanzada, o hipertrigliceridemia grave.  Disminuya el nivel de estrs Vivir con diabetes puede ser estresante. Cuando usted est bajo estrs, la glucemia puede verse afectada de dos maneras:  Las hormonas del estrs pueden hacer que la glucosa en la sangre se eleve.  Probablemente no se cuid lo suficiente.  Est al tanto del nivel de estrs y haga los cambios que sean necesarios para ayudar a Company secretary las situaciones difciles. Para disminuir sus niveles de estrs:  Considere la posibilidad de  participar en un grupo de apoyo.  Realice relajacin o meditacin planificada.  Haga un pasatiempos que disfrute.  Mantenga relaciones saludables.  Haga ejercicios regularmente.  Trabaje con su mdico o un profesional de la salud mental.  Resumen  Usted puede actuar para prevenir o disminuir los problemas causados por la diabetes (diabetes mellitus). Seguir un plan para la diabetes y cuidarse usted mismo puede reducir el riesgo de complicaciones graves o potencialmente mortales.  Siga las indicaciones del mdico acerca de cmo tratar la diabetes. Su diabetes puede ser tratada por  un equipo de profesionales de la salud que le pueden ensear a cuidarse y pueden responder las preguntas que tenga.  Su mdico le informar con qu frecuencia debe atenderse, dependiendo de su plan de control de la diabetes. Concurra a todas las visitas de control como se le haya indicado. Esto es importante para identificar rpidamente posibles problemas y poder evitar o tratar las complicaciones. Esta informacin no tiene Theme park managercomo fin reemplazar el consejo del mdico. Asegrese de hacerle al mdico cualquier pregunta que tenga. Document Released: 03/15/2011 Document Revised: 03/07/2016 Document Reviewed: 05/20/2013 Elsevier Interactive Patient Education  [employer]2018 Elsevier Inc.

## 2018-02-06 ENCOUNTER — Other Ambulatory Visit: Payer: Self-pay

## 2018-02-11 ENCOUNTER — Other Ambulatory Visit: Payer: Self-pay

## 2018-02-11 DIAGNOSIS — E119 Type 2 diabetes mellitus without complications: Secondary | ICD-10-CM

## 2018-02-12 LAB — COMPREHENSIVE METABOLIC PANEL
A/G RATIO: 2 (ref 1.2–2.2)
ALK PHOS: 76 IU/L (ref 39–117)
ALT: 28 IU/L (ref 0–44)
AST: 19 IU/L (ref 0–40)
Albumin: 4.9 g/dL (ref 3.5–5.5)
BUN/Creatinine Ratio: 21 — ABNORMAL HIGH (ref 9–20)
BUN: 14 mg/dL (ref 6–24)
Bilirubin Total: 0.5 mg/dL (ref 0.0–1.2)
CALCIUM: 9.5 mg/dL (ref 8.7–10.2)
CO2: 20 mmol/L (ref 20–29)
Chloride: 102 mmol/L (ref 96–106)
Creatinine, Ser: 0.67 mg/dL — ABNORMAL LOW (ref 0.76–1.27)
GFR calc Af Amer: 134 mL/min/{1.73_m2} (ref >59)
GFR, EST NON AFRICAN AMERICAN: 116 mL/min/{1.73_m2} (ref >59)
Globulin, Total: 2.4 g/dL (ref 1.5–4.5)
Glucose: 133 mg/dL — ABNORMAL HIGH (ref 65–99)
POTASSIUM: 3.9 mmol/L (ref 3.5–5.2)
Sodium: 138 mmol/L (ref 134–144)
Total Protein: 7.3 g/dL (ref 6.0–8.5)

## 2018-02-12 LAB — LIPID PANEL W/O CHOL/HDL RATIO
CHOLESTEROL TOTAL: 225 mg/dL — AB (ref 100–199)
HDL: 53 mg/dL (ref >39)
LDL CALC: 151 mg/dL — AB (ref 0–99)
Triglycerides: 104 mg/dL (ref 0–149)
VLDL CHOLESTEROL CAL: 21 mg/dL (ref 5–40)

## 2018-02-12 LAB — HGB A1C W/O EAG: Hgb A1c MFr Bld: 6.8 % — ABNORMAL HIGH (ref 4.8–5.6)

## 2018-02-13 ENCOUNTER — Ambulatory Visit: Payer: Self-pay

## 2018-02-18 ENCOUNTER — Ambulatory Visit: Payer: Self-pay | Admitting: Family Medicine

## 2018-02-18 VITALS — BP 114/82 | HR 95 | Temp 97.3°F | Wt 162.1 lb

## 2018-02-18 DIAGNOSIS — E119 Type 2 diabetes mellitus without complications: Secondary | ICD-10-CM

## 2018-02-18 DIAGNOSIS — I10 Essential (primary) hypertension: Secondary | ICD-10-CM

## 2018-02-18 DIAGNOSIS — E785 Hyperlipidemia, unspecified: Secondary | ICD-10-CM

## 2018-02-18 MED ORDER — SIMVASTATIN 20 MG PO TABS: 20.0000 mg | ORAL_TABLET | Freq: Every day | ORAL | 2 refills | Status: DC

## 2018-02-18 MED ORDER — METFORMIN HCL 1000 MG PO TABS: 1000.0000 mg | ORAL_TABLET | Freq: Two times a day (BID) | ORAL | 5 refills | Status: DC

## 2018-02-18 MED ORDER — CARVEDILOL 12.5 MG PO TABS: 12.5000 mg | ORAL_TABLET | Freq: Two times a day (BID) | ORAL | 5 refills | Status: DC

## 2018-02-18 NOTE — Progress Notes (Signed)
BP 114/82   Pulse 95   Temp (!) 97.3 F (36.3 C)   Wt 162 lb 1.6 oz (73.5 kg)   BMI 27.34 kg/m    Subjective:    Patient ID: Hector Weber, male    DOB: 07-03-1970, 47 y.o.   MRN: [id number]  HPI: Hector Weber is a 47 y.o. male  Chief Complaint  Patient presents with  . Medication Management    HPI Patient is here for medicine refills   Type 2 diabetes; on metformin; taking meds for years, 9-10 years; grandfather and mother and aunts have diabetes; avoiding sugary drinks flu shot UTD  Lab Results  Component Value Date   HGBA1C 6.8 (H) 02/11/2018    Sometimes dry mouth, just sometimes; not drinking lots of sweet drinks  High cholesterol; not taking cholesterol medicine right now but maybe once a week Lab Results  Component Value Date   CHOL 225 (H) 02/11/2018   HDL 53 02/11/2018   LDLCALC 151 (H) 02/11/2018   TRIG 104 02/11/2018   CHOLHDL 5.2 (H) 08/15/2017   Smokes, 1-2 cigarettes a day; not every day; wife smokes every day  No flowsheet data found. No flowsheet data found.  Relevant past medical, surgical, family and social history reviewed Past Medical History:  Diagnosis Date  . Anemia   . Arthritis   . Chronic kidney disease   . Diabetes (HCC)   . Hyperlipidemia   . Hypertension   . Sleep apnea    No past surgical history on file. Family History  Problem Relation Age of Onset  . Diabetes type II Mother   . Seizures Father    Social History   Tobacco Use  . Smoking status: Current Some Day Smoker    Packs/day: 0.25    Types: Cigarettes  . Smokeless tobacco: Former Neurosurgeon    Types: Snuff, Chew  Substance Use Topics  . Alcohol use: Yes    Alcohol/week: 6.0 standard drinks    Types: 6 Cans of beer per week    Comment: 6 pack/month  . Drug use: No     Interim medical history since last visit reviewed. Allergies and medications reviewed  Review of Systems Per HPI unless specifically indicated above     Objective:     BP 114/82   Pulse 95   Temp (!) 97.3 F (36.3 C)   Wt 162 lb 1.6 oz (73.5 kg)   BMI 27.34 kg/m   Wt Readings from Last 3 Encounters:  02/18/18 162 lb 1.6 oz (73.5 kg)  11/14/17 158 lb 11.2 oz (72 kg)  09/26/17 158 lb 6.4 oz (71.8 kg)    Physical Exam  Constitutional: He appears well-developed and well-nourished. No distress.  HENT:  Head: Normocephalic and atraumatic.  Eyes: EOM are normal. No scleral icterus.  Neck: No thyromegaly present.  Cardiovascular: Normal rate and regular rhythm.  Pulmonary/Chest: Effort normal and breath sounds normal.  Abdominal: Soft. Bowel sounds are normal. He exhibits no distension.  Musculoskeletal: He exhibits no edema.  Neurological: Coordination normal.  Skin: Skin is warm and dry. No pallor.  Psychiatric: He has a normal mood and affect. His behavior is normal. Judgment and thought content normal.    Results for orders placed or performed in visit on 02/11/18  Lipid Panel w/o Chol/HDL Ratio  Result Value Ref Range   Cholesterol, Total 225 (H) 100 - 199 mg/dL   Triglycerides 132 0 - 149 mg/dL   HDL 53 >44 mg/dL  VLDL Cholesterol Cal 21 5 - 40 mg/dL   LDL Calculated 409 (H) 0 - 99 mg/dL  Comprehensive metabolic panel  Result Value Ref Range   Glucose 133 (H) 65 - 99 mg/dL   BUN 14 6 - 24 mg/dL   Creatinine, Ser 8.11 (L) 0.76 - 1.27 mg/dL   GFR calc non Af Amer 116 >59 mL/min/1.73   GFR calc Af Amer 134 >59 mL/min/1.73   BUN/Creatinine Ratio 21 (H) 9 - 20   Sodium 138 134 - 144 mmol/L   Potassium 3.9 3.5 - 5.2 mmol/L   Chloride 102 96 - 106 mmol/L   CO2 20 20 - 29 mmol/L   Calcium 9.5 8.7 - 10.2 mg/dL   Total Protein 7.3 6.0 - 8.5 g/dL   Albumin 4.9 3.5 - 5.5 g/dL   Globulin, Total 2.4 1.5 - 4.5 g/dL   Albumin/Globulin Ratio 2.0 1.2 - 2.2   Bilirubin Total 0.5 0.0 - 1.2 mg/dL   Alkaline Phosphatase 76 39 - 117 IU/L   AST 19 0 - 40 IU/L   ALT 28 0 - 44 IU/L  Hgb A1c w/o eAG  Result Value Ref Range   Hgb A1c MFr Bld 6.8 (H)  4.8 - 5.6 %      Assessment & Plan:   Problem List Items Addressed This Visit      Cardiovascular and Mediastinum   Essential hypertension    controlled      Relevant Medications   simvastatin (ZOCOR) 20 MG tablet   carvedilol (COREG) 12.5 MG tablet     Endocrine   Diabetes (HCC) - Primary (Chronic)   Relevant Medications   simvastatin (ZOCOR) 20 MG tablet   metFORMIN (GLUCOPHAGE) 1000 MG tablet   Other Relevant Orders   Ambulatory referral to Ophthalmology     Other   Hyperlipidemia LDL goal <70    Simvastatin 20; recheck lipids in 6-8 weeks; limit saturated fats      Relevant Medications   simvastatin (ZOCOR) 20 MG tablet   carvedilol (COREG) 12.5 MG tablet    Other Visit Diagnoses    Diabetes mellitus without complication (HCC)       Relevant Medications   simvastatin (ZOCOR) 20 MG tablet   metFORMIN (GLUCOPHAGE) 1000 MG tablet       Follow up plan: Return in about 8 weeks (around 04/15/2018) for fasting labs only; visit wiht provider one or two weeks later.  An after-visit summary was printed and given to the patient at check-out.  Please see the patient instructions which may contain other information and recommendations beyond what is mentioned above in the assessment and plan.  Meds ordered this encounter  Medications  . simvastatin (ZOCOR) 20 MG tablet    Sig: Take 1 tablet (20 mg total) by mouth daily.    Dispense:  30 tablet    Refill:  2  . carvedilol (COREG) 12.5 MG tablet    Sig: Take 1 tablet (12.5 mg total) by mouth 2 (two) times daily with a meal.    Dispense:  60 tablet    Refill:  5    Pt needs appt for further refills  . metFORMIN (GLUCOPHAGE) 1000 MG tablet    Sig: Take 1 tablet (1,000 mg total) by mouth 2 (two) times daily with a meal. Reported on 08/30/2015    Dispense:  60 tablet    Refill:  5    Orders Placed This Encounter  Procedures  . Ambulatory referral to Ophthalmology

## 2018-02-18 NOTE — Patient Instructions (Addendum)
Please do take your cholesterol medicine every single day Recheck cholesterol in 6-8 weeks We'll have you see the eye doctor soon  I do encourage you to quit smoking Call [phone] to sign up for smoking cessation classes You can call 1-800-QUIT-NOW to talk with a smoking cessation coach   Health Risks of Smoking Smoking cigarettes is very bad for your health. Tobacco smoke has over 200 known poisons in it. It contains the poisonous gases nitrogen oxide and carbon monoxide. There are over 60 chemicals in tobacco smoke that cause cancer. Smoking is difficult to quit because a chemical in tobacco, called nicotine, causes addiction or dependence. When you smoke and inhale, nicotine is absorbed rapidly into the bloodstream through your lungs. Both inhaled and non-inhaled nicotine may be addictive. What are the risks of cigarette smoke? Cigarette smokers have an increased risk of many serious medical problems, including:  Lung cancer.  Lung disease, such as pneumonia, bronchitis, and emphysema.  Chest pain (angina) and heart attack because the heart is not getting enough oxygen.  Heart disease and peripheral blood vessel disease.  High blood pressure (hypertension).  Stroke.  Oral cancer, including cancer of the lip, mouth, or voice box.  Bladder cancer.  Pancreatic cancer.  Cervical cancer.  Pregnancy complications, including premature birth.  Stillbirths and smaller newborn babies, birth defects, and genetic damage to sperm.  Early menopause.  Lower estrogen level for women.  Infertility.  Facial wrinkles.  Blindness.  Increased risk of broken bones (fractures).  Senile dementia.  Stomach ulcers and internal bleeding.  Delayed wound healing and increased risk of complications during surgery.  Even smoking lightly shortens your life expectancy by several years.  Because of secondhand smoke exposure, children of smokers have an increased risk of the  following:  Sudden infant death syndrome (SIDS).  Respiratory infections.  Lung cancer.  Heart disease.  Ear infections.  What are the benefits of quitting? There are many health benefits of quitting smoking. Here are some of them:  Within days of quitting smoking, your risk of having a heart attack decreases, your blood flow improves, and your lung capacity improves. Blood pressure, pulse rate, and breathing patterns start returning to normal soon after quitting.  Within months, your lungs may clear up completely.  Quitting for 10 years reduces your risk of developing lung cancer and heart disease to almost that of a nonsmoker.  People who quit may see an improvement in their overall quality of life.  How do I quit smoking? Smoking is an addiction with both physical and psychological effects, and longtime habits can be hard to change. Your health care provider can recommend:  Programs and community resources, which may include group support, education, or talk therapy.  Prescription medicines to help reduce cravings.  Nicotine replacement products, such as patches, gum, and nasal sprays. Use these products only as directed. Do not replace cigarette smoking with electronic cigarettes, which are commonly called e-cigarettes. The safety of e-cigarettes is not known, and some may contain harmful chemicals.  A combination of two or more of these methods.  Where to find more information:  American Lung Association: www.lung.org  American Cancer Society: www.cancer.org Summary  Smoking cigarettes is very bad for your health. Cigarette smokers have an increased risk of many serious medical problems, including several cancers, heart disease, and stroke.  Smoking is an addiction with both physical and psychological effects, and longtime habits can be hard to change.  By stopping right away, you can greatly reduce  the risk of medical problems for you and your family.  To help you  quit smoking, your health care provider can recommend programs, community resources, prescription medicines, and nicotine replacement products such as patches, gum, and nasal sprays. This information is not intended to replace advice given to you by your health care provider. Make sure you discuss any questions you have with your health care provider. Document Released: 05/03/2004 Document Revised: 03/30/2016 Document Reviewed: 03/30/2016 Elsevier Interactive Patient Education  2017 [employer].  Steps to Quit Smoking Smoking tobacco can be bad for your health. It can also affect almost every organ in your body. Smoking puts you and people around you at risk for many serious long-lasting (chronic) diseases. Quitting smoking is hard, but it is one of the best things that you can do for your health. It is never too late to quit. What are the benefits of quitting smoking? When you quit smoking, you lower your risk for getting serious diseases and conditions. They can include:  Lung cancer or lung disease.  Heart disease.  Stroke.  Heart attack.  Not being able to have children (infertility).  Weak bones (osteoporosis) and broken bones (fractures).  If you have coughing, wheezing, and shortness of breath, those symptoms may get better when you quit. You may also get sick less often. If you are pregnant, quitting smoking can help to lower your chances of having a baby of low birth weight. What can I do to help me quit smoking? Talk with your doctor about what can help you quit smoking. Some things you can do (strategies) include:  Quitting smoking totally, instead of slowly cutting back how much you smoke over a period of time.  Going to in-person counseling. You are more likely to quit if you go to many counseling sessions.  Using resources and support systems, such as: ? Agricultural engineer with a Veterinary surgeon. ? Phone quitlines. ? Automotive engineer. ? Support groups or group  counseling. ? Text messaging programs. ? Mobile phone apps or applications.  Taking medicines. Some of these medicines may have nicotine in them. If you are pregnant or breastfeeding, do not take any medicines to quit smoking unless your doctor says it is okay. Talk with your doctor about counseling or other things that can help you.  Talk with your doctor about using more than one strategy at the same time, such as taking medicines while you are also going to in-person counseling. This can help make quitting easier. What things can I do to make it easier to quit? Quitting smoking might feel very hard at first, but there is a lot that you can do to make it easier. Take these steps:  Talk to your family and friends. Ask them to support and encourage you.  Call phone quitlines, reach out to support groups, or work with a Veterinary surgeon.  Ask people who smoke to not smoke around you.  Avoid places that make you want (trigger) to smoke, such as: ? Bars. ? Parties. ? Smoke-break areas at work.  Spend time with people who do not smoke.  Lower the stress in your life. Stress can make you want to smoke. Try these things to help your stress: ? Getting regular exercise. ? Deep-breathing exercises. ? Yoga. ? Meditating. ? Doing a body scan. To do this, close your eyes, focus on one area of your body at a time from head to toe, and notice which parts of your body are tense. Try to relax  the muscles in those areas.  Download or buy apps on your mobile phone or tablet that can help you stick to your quit plan. There are many free apps, such as QuitGuide from the [employer] developer for Disease Control and Prevention). You can find more support from smokefree.gov and other websites.  This information is not intended to replace advice given to you by your health care provider. Make sure you discuss any questions you have with your health care provider. Document Released: 01/20/2009 Document Revised: 11/22/2015  Document Reviewed: 08/10/2014 Elsevier Interactive Patient Education  2018 [employer].

## 2018-03-08 NOTE — Assessment & Plan Note (Signed)
controlled 

## 2018-03-08 NOTE — Assessment & Plan Note (Signed)
Simvastatin 20; recheck lipids in 6-8 weeks; limit saturated fats

## 2018-03-26 ENCOUNTER — Ambulatory Visit: Payer: Self-pay | Admitting: Gerontology

## 2018-03-26 ENCOUNTER — Encounter: Payer: Self-pay | Admitting: Gerontology

## 2018-03-26 ENCOUNTER — Other Ambulatory Visit: Payer: Self-pay

## 2018-03-26 DIAGNOSIS — H6122 Impacted cerumen, left ear: Secondary | ICD-10-CM

## 2018-03-26 DIAGNOSIS — H9312 Tinnitus, left ear: Secondary | ICD-10-CM

## 2018-03-26 DIAGNOSIS — H6120 Impacted cerumen, unspecified ear: Secondary | ICD-10-CM | POA: Insufficient documentation

## 2018-03-26 MED ORDER — CARBAMIDE PEROXIDE 6.5 % OT SOLN: 5.0000 [drp] | Freq: Once | OTIC | Status: DC

## 2018-03-26 NOTE — Patient Instructions (Signed)
Carbamide Peroxide ear solution  What is this medicine?  CARBAMIDE PEROXIDE (CAR bah mide per OX ide) is used to soften and help remove ear wax.  This medicine may be used for other purposes; ask your health care provider or pharmacist if you have questions.  COMMON BRAND NAME(S): Auro Ear, Auro Earache Relief, Debrox, Ear Drops, Ear Wax Removal, Ear Wax Remover, Earwax Treatment, Murine, Thera-Ear  What should I tell my health care provider before I take this medicine?  They need to know if you have any of these conditions:  -dizziness  -ear discharge  -ear pain, irritation or rash  -infection  -perforated eardrum (hole in eardrum)  -an unusual or allergic reaction to carbamide peroxide, glycerin, hydrogen peroxide, other medicines, foods, dyes, or preservatives  -pregnant or trying to get pregnant  -breast-feeding  How should I use this medicine?  This medicine is only for use in the outer ear canal. Follow the directions carefully. Wash hands before and after use. The solution may be warmed by holding the bottle in the hand for 1 to 2 minutes. Lie with the affected ear facing upward. Place the proper number of drops into the ear canal. After the drops are instilled, remain lying with the affected ear upward for 5 minutes to help the drops stay in the ear canal. A cotton ball may be gently inserted at the ear opening for no longer than 5 to 10 minutes to ensure retention. Repeat, if necessary, for the opposite ear. Do not touch the tip of the dropper to the ear, fingertips, or other surface. Do not rinse the dropper after use. Keep container tightly closed.  Talk to your pediatrician regarding the use of this medicine in children. While this drug may be used in children as young as 12 years for selected conditions, precautions do apply.  Overdosage: If you think you have taken too much of this medicine contact a poison control center or emergency room at once.  NOTE: This medicine is only for you. Do not share  this medicine with others.  What if I miss a dose?  If you miss a dose, use it as soon as you can. If it is almost time for your next dose, use only that dose. Do not use double or extra doses.  What may interact with this medicine?  Interactions are not expected. Do not use any other ear products without asking your doctor or health care professional.  This list may not describe all possible interactions. Give your health care provider a list of all the medicines, herbs, non-prescription drugs, or dietary supplements you use. Also tell them if you smoke, drink alcohol, or use illegal drugs. Some items may interact with your medicine.  What should I watch for while using this medicine?  This medicine is not for long-term use. Do not use for more than 4 days without checking with your health care professional. Contact your doctor or health care professional if your condition does not start to get better within a few days or if you notice burning, redness, itching or swelling.  What side effects may I notice from receiving this medicine?  Side effects that you should report to your doctor or health care professional as soon as possible:  -allergic reactions like skin rash, itching or hives, swelling of the face, lips, or tongue  -burning, itching, and redness  -worsening ear pain  -rash  Side effects that usually do not require medical attention (report to your doctor   or health care professional if they continue or are bothersome):  -abnormal sensation while putting the drops in the ear  -temporary reduction in hearing (but not complete loss of hearing)  This list may not describe all possible side effects. Call your doctor for medical advice about side effects. You may report side effects to FDA at 1-800-FDA-1088.  Where should I keep my medicine?  Keep out of the reach of children.  Store at room temperature between 15 and 30 degrees C (59 and 86 degrees F) in a tight, light-resistant container. Keep bottle away  from excessive heat and direct sunlight. Throw away any unused medicine after the expiration date.  NOTE: This sheet is a summary. It may not cover all possible information. If you have questions about this medicine, talk to your doctor, pharmacist, or health care provider.  © 2019 Elsevier/Gold Standard (2007-07-08 14:00:02)

## 2018-03-26 NOTE — Progress Notes (Signed)
Patient: Hector Weber Male    DOB: January 11, 1971   47 y.o.   MRN: [id number] Visit Date: 03/26/2018  Today's Provider: Rolm Galahioma E Joell Usman, NP   Chief Complaint  Patient presents with  . Follow-up    Left ear itch   Subjective:    HPI   Hector Weber 47 y/o male presents for c/o tinnitus and itching inside his left ear that has beng going on for 2 weeks. He stated that he's experiencing hearing loss to the left ear.He reports using Q tips frequently to clean his ears. He reports that he had the same concern 10 years ago and dried cerumen was manually removed from his left ear. He denies otalgia, rhinorrhea, dizziness. He reports that he smokes 1-2 cigarettes a day and is working on quitting.  No Known Allergies Previous Medications   CARVEDILOL (COREG) 12.5 MG TABLET    Take 1 tablet (12.5 mg total) by mouth 2 (two) times daily with a meal.   METFORMIN (GLUCOPHAGE) 1000 MG TABLET    Take 1 tablet (1,000 mg total) by mouth 2 (two) times daily with a meal. Reported on 08/30/2015   SIMVASTATIN (ZOCOR) 20 MG TABLET    Take 1 tablet (20 mg total) by mouth daily.    Review of Systems  HENT: Positive for hearing loss. Negative for ear discharge, ear pain, rhinorrhea and sinus pain.   Eyes: Negative.   Respiratory: Negative.   Cardiovascular: Negative.     Social History   Tobacco Use  . Smoking status: Current Some Day Smoker    Packs/day: 0.25    Types: Cigarettes  . Smokeless tobacco: Former NeurosurgeonUser    Types: Snuff, Chew  Substance Use Topics  . Alcohol use: Yes    Alcohol/week: 6.0 standard drinks    Types: 6 Cans of beer per week    Comment: 6 pack/month   Objective:   BP 133/81   Pulse 82   Temp 97.7 F (36.5 C)   Wt 161 lb 12.8 oz (73.4 kg)   SpO2 97%   BMI 27.29 kg/m   Physical Exam Nursing note reviewed.  Constitutional:      Appearance: Normal appearance.  HENT:     Head: Normocephalic and atraumatic.     Left Ear: Decreased hearing (to left ear) noted. No  drainage. There is impacted cerumen (to left ear).     Nose: Nose normal.     Mouth/Throat:     Mouth: Mucous membranes are moist.  Eyes:     Extraocular Movements: Extraocular movements intact.     Pupils: Pupils are equal, round, and reactive to light.  Cardiovascular:     Rate and Rhythm: Normal rate and regular rhythm.     Pulses: Normal pulses.     Heart sounds: Normal heart sounds.  Pulmonary:     Effort: Pulmonary effort is normal.     Breath sounds: Normal breath sounds.  Neurological:     General: No focal deficit present.     Mental Status: [name] alert and oriented to person, place, and time.  Psychiatric:        Mood and Affect: Mood normal.        Behavior: Behavior normal.         Assessment & Plan:     1. Tinnitus of left ear - Use Carbamide peroxide 6.5 % for cerumen removal.  2. Impacted cerumen of left ear - Patient's left ear was irrigated, small amount of wax was  removed with curette. - He was advised to use Carbamide peroxide 6.5% ear solution, apply 5-10 drops to left ear BID for 4 days and notify the clinic for worsening symptoms.       Rolm Gala, NP   [facility]

## 2018-04-15 ENCOUNTER — Other Ambulatory Visit: Payer: Self-pay

## 2018-04-15 DIAGNOSIS — E119 Type 2 diabetes mellitus without complications: Secondary | ICD-10-CM

## 2018-04-16 LAB — LIPID PANEL
Chol/HDL Ratio: 3.8 ratio (ref 0.0–5.0)
Cholesterol, Total: 181 mg/dL (ref 100–199)
HDL: 48 mg/dL (ref >39)
LDL Calculated: 108 mg/dL — ABNORMAL HIGH (ref 0–99)
Triglycerides: 123 mg/dL (ref 0–149)
VLDL Cholesterol Cal: 25 mg/dL (ref 5–40)

## 2018-04-16 LAB — HEMOGLOBIN A1C
Est. average glucose Bld gHb Est-mCnc: 157 mg/dL
Hgb A1c MFr Bld: 7.1 % — ABNORMAL HIGH (ref 4.8–5.6)

## 2018-04-22 ENCOUNTER — Encounter: Payer: Self-pay | Admitting: Adult Health Nurse Practitioner

## 2018-04-22 ENCOUNTER — Ambulatory Visit: Payer: Self-pay | Admitting: Adult Health Nurse Practitioner

## 2018-04-22 VITALS — BP 143/71 | HR 93 | Temp 98.7°F | Ht 64.0 in | Wt 162.6 lb

## 2018-04-22 DIAGNOSIS — E785 Hyperlipidemia, unspecified: Secondary | ICD-10-CM

## 2018-04-22 DIAGNOSIS — E119 Type 2 diabetes mellitus without complications: Secondary | ICD-10-CM

## 2018-04-22 DIAGNOSIS — I10 Essential (primary) hypertension: Secondary | ICD-10-CM

## 2018-04-22 DIAGNOSIS — H6122 Impacted cerumen, left ear: Secondary | ICD-10-CM

## 2018-04-22 MED ORDER — ATORVASTATIN CALCIUM 40 MG PO TABS: 40.0000 mg | ORAL_TABLET | Freq: Every day | ORAL | 3 refills | Status: DC

## 2018-04-22 NOTE — Progress Notes (Signed)
  Patient: Hector Weber Male    DOB: 06/09/1970   48 y.o.   MRN: [id number] Visit Date: 04/22/2018  Today's Provider: Jacelyn Pi, NP   Chief Complaint  Patient presents with  . Follow-up    Pt wants to review most recent labs   Subjective:    HPI  A1c up slightly to 7.1 from 6.8.   LDL down to 108 from 151. Has been on simvastatin for a while.   Taking medications as directed.    Pt states that he is having difficulty hearing out of the left ear- states he has impacted ear wax- he has had it cleaned out before and it helped well. Last OV he was rx debrox drops that has not been helpful.     No Known Allergies Previous Medications   CARVEDILOL (COREG) 12.5 MG TABLET    Take 1 tablet (12.5 mg total) by mouth 2 (two) times daily with a meal.   METFORMIN (GLUCOPHAGE) 1000 MG TABLET    Take 1 tablet (1,000 mg total) by mouth 2 (two) times daily with a meal. Reported on 08/30/2015   SIMVASTATIN (ZOCOR) 20 MG TABLET    Take 1 tablet (20 mg total) by mouth daily.    Review of Systems  All other systems reviewed and are negative.   Social History   Tobacco Use  . Smoking status: Current Some Day Smoker    Packs/day: 0.25    Types: Cigarettes  . Smokeless tobacco: Former Neurosurgeon    Types: Snuff, Chew  Substance Use Topics  . Alcohol use: Yes    Alcohol/week: 6.0 standard drinks    Types: 6 Cans of beer per week    Comment: 6 pack/month   Objective:   BP (!) 143/71 (BP Location: Left Arm, Patient Position: Sitting, Cuff Size: Small)   Pulse 93   Temp 98.7 F (37.1 C) (Oral)   Ht 5\' 4"  (1.626 m)   Wt 162 lb 9.6 oz (73.8 kg)   BMI 27.91 kg/m   Physical Exam Vitals signs reviewed.  Constitutional:      Appearance: Normal appearance.  Neck:     Musculoskeletal: Neck supple.  Cardiovascular:     Rate and Rhythm: Normal rate and regular rhythm.  Pulmonary:     Effort: Pulmonary effort is normal.     Breath sounds: Normal breath sounds.  Abdominal:   General: Bowel sounds are normal.     Palpations: Abdomen is soft.  Skin:    General: Skin is warm and dry.  Neurological:     Mental Status: [name] alert.         Assessment & Plan:         DM:  Controlled.  Encourage diabetic diet and exercise.  Continue current medication regimen.   HLD:  Borderline.   Change simvastatin to atorvastatin 40mg .  Encourage low cholesterol, low fat diet and exercise. FU in 4 weeks for CMET only.    Will refer to ENT for impacted ear wax.   BP slightly elevated- last visit WNL. Will monitor at next OV.     Jacelyn Pi, NP   [facility]

## 2018-05-05 ENCOUNTER — Other Ambulatory Visit: Payer: Self-pay

## 2018-05-05 ENCOUNTER — Encounter: Payer: Self-pay | Admitting: Emergency Medicine

## 2018-05-05 ENCOUNTER — Emergency Department
Admission: EM | Admit: 2018-05-05 | Discharge: 2018-05-05 | Disposition: A | Discharge status: Home / Self Care | Payer: Self-pay | Attending: Student in an Organized Health Care Education/Training Program | Admitting: Student in an Organized Health Care Education/Training Program

## 2018-05-05 DIAGNOSIS — F17210 Nicotine dependence, cigarettes, uncomplicated: Secondary | ICD-10-CM | POA: Insufficient documentation

## 2018-05-05 DIAGNOSIS — E1122 Type 2 diabetes mellitus with diabetic chronic kidney disease: Secondary | ICD-10-CM | POA: Insufficient documentation

## 2018-05-05 DIAGNOSIS — H6122 Impacted cerumen, left ear: Secondary | ICD-10-CM | POA: Insufficient documentation

## 2018-05-05 DIAGNOSIS — Z7984 Long term (current) use of oral hypoglycemic drugs: Secondary | ICD-10-CM | POA: Insufficient documentation

## 2018-05-05 DIAGNOSIS — N189 Chronic kidney disease, unspecified: Secondary | ICD-10-CM | POA: Insufficient documentation

## 2018-05-05 DIAGNOSIS — I129 Hypertensive chronic kidney disease with stage 1 through stage 4 chronic kidney disease, or unspecified chronic kidney disease: Secondary | ICD-10-CM | POA: Insufficient documentation

## 2018-05-05 DIAGNOSIS — Z79899 Other long term (current) drug therapy: Secondary | ICD-10-CM | POA: Insufficient documentation

## 2018-05-05 NOTE — ED Notes (Signed)
Left ear irrigated with 20 ml of warm water   Small amt of wax removed

## 2018-05-05 NOTE — ED Triage Notes (Signed)
States has had itching ears x several months. Thinks has wax in ears. Decreased hearing.

## 2018-05-05 NOTE — ED Notes (Signed)
Pt c/o having muffled hearing for a while now, states he has [facility] and was given ear drops but states is has worsened. Denies any pain.

## 2018-05-05 NOTE — ED Provider Notes (Signed)
[facility] Emergency Department Provider Note   ____________________________________________   First MD Initiated Contact with Patient 05/05/18 0818     (approximate)  I have reviewed the triage vital signs and the nursing notes.   HISTORY  Chief Complaint ear itching    HPI Hector Weber is a 48 y.o. male   patient presents with cerumen impaction which is ongoing problem.  Patient was seen 2 weeks ago urgent care clinic if no resolution.  Patient also complain of mild hearing loss secondary to traction.  Patient denies vertigo.   Past Medical History:  Diagnosis Date  . Anemia   . Arthritis   . Chronic kidney disease   . Diabetes ([place])   . Hyperlipidemia   . Hypertension   . Sleep apnea     Patient Active Problem List   Diagnosis Date Noted  . Tinnitus of left ear 03/26/2018  . Cerumen impaction 03/26/2018  . Medication monitoring encounter 07/17/2016  . Diabetes ([place]) 02/03/2015  . Essential hypertension 02/03/2015  . Hyperlipidemia LDL goal <70 02/03/2015    History reviewed. No pertinent surgical history.  Prior to Admission medications   Medication Sig Start Date End Date Taking? Authorizing Provider  atorvastatin (LIPITOR) 40 MG tablet Take 1 tablet (40 mg total) by mouth daily. 04/22/18   Doles-Johnson, Teah, NP  carvedilol (COREG) 12.5 MG tablet Take 1 tablet (12.5 mg total) by mouth 2 (two) times daily with a meal. 02/18/18   Lada, Satira Anis, MD  metFORMIN (GLUCOPHAGE) 1000 MG tablet Take 1 tablet (1,000 mg total) by mouth 2 (two) times daily with a meal. Reported on 08/30/2015 02/18/18   Arnetha Courser, MD    Allergies Patient has no known allergies.  Family History  Problem Relation Age of Onset  . Diabetes type II Mother   . Seizures Father     Social History Social History   Tobacco Use  . Smoking status: Current Some Day Smoker    Packs/day: 0.25    Types: Cigarettes  . Smokeless tobacco: Former Systems developer   Types: Snuff, Chew  Substance Use Topics  . Alcohol use: Yes    Alcohol/week: 6.0 standard drinks    Types: 6 Cans of beer per week    Comment: 6 pack/month  . Drug use: No    Review of Systems  Constitutional: No fever/chills Eyes: No visual changes. ENT: No sore throat. Cardiovascular: Denies chest pain. Respiratory: Denies shortness of breath. Gastrointestinal: No abdominal pain.  No nausea, no vomiting.  No diarrhea.  No constipation. Genitourinary: Negative for dysuria. Musculoskeletal: Negative for back pain. Skin: Negative for rash. Neurological: Negative for headaches, focal weakness or numbness. Endocrine:  Diabetes, hyperlipidemia, and hypertension.  ____________________________________________   PHYSICAL EXAM:  VITAL SIGNS: ED Triage Vitals  Enc Vitals Group     BP 05/05/18 0809 133/85     Pulse Rate 05/05/18 0809 82     Resp 05/05/18 0809 18     Temp 05/05/18 0809 98 F (36.7 C)     Temp Source 05/05/18 0809 Oral     SpO2 05/05/18 0809 100 %     Weight 05/05/18 0810 160 lb (72.6 kg)     Height 05/05/18 0810 '5\' 4"'  (1.626 m)     Head Circumference --      Peak Flow --      Pain Score 05/05/18 0810 0     Pain Loc --      Pain Edu? --  Excl. in [place]? --     Constitutional: Alert and oriented. Well appearing and in no acute distress. EAR: Bilateral impaction. Mouth/Throat: Mucous membranes are moist.  Oropharynx non-erythematous. Cardiovascular: Normal rate, regular rhythm. Grossly normal heart sounds.  Good peripheral circulation. Respiratory: Normal respiratory effort.  No retractions. Lungs CTAB. Skin:  Skin is warm, dry and intact. No rash noted. Psychiatric: Mood and affect are normal. Speech and behavior are normal.  ____________________________________________   LABS (all labs ordered are listed, but only abnormal results are displayed)  Labs Reviewed - No data to  display ____________________________________________  EKG   ____________________________________________  RADIOLOGY  ED MD interpretation:    Official radiology report(s): No results found.  ____________________________________________   PROCEDURES  Procedure(s) performed: None  Procedures  Critical Care performed: No  ____________________________________________   INITIAL IMPRESSION / ASSESSMENT AND PLAN / ED COURSE  As part of my medical decision making, I reviewed the following data within the electronic MEDICAL RECORD NUMBER     Mild hearing loss secondary to cerumen impaction.  Status post irrigation patient states hearing is normal.  Patient given discharge care instruction advised to purchase over-the-counter ear irrigation kit.      ____________________________________________   FINAL CLINICAL IMPRESSION(S) / ED DIAGNOSES  Final diagnoses:  Impacted cerumen of left ear     ED Discharge Orders    None       Note:  This document was prepared using Dragon voice recognition software and may include unintentional dictation errors.    Sable Feil, PA-C 05/05/18 1042    Merlyn Lot, MD 05/05/18 1100

## 2018-05-05 NOTE — Discharge Instructions (Addendum)
Advised to purchase over-the-counter ear irrigation kit.

## 2018-05-20 ENCOUNTER — Other Ambulatory Visit: Payer: Self-pay

## 2018-05-20 DIAGNOSIS — E785 Hyperlipidemia, unspecified: Secondary | ICD-10-CM

## 2018-05-20 DIAGNOSIS — I10 Essential (primary) hypertension: Secondary | ICD-10-CM

## 2018-05-20 DIAGNOSIS — E119 Type 2 diabetes mellitus without complications: Secondary | ICD-10-CM

## 2018-05-21 LAB — LIPID PANEL
CHOL/HDL RATIO: 3.5 ratio (ref 0.0–5.0)
Cholesterol, Total: 159 mg/dL (ref 100–199)
HDL: 45 mg/dL (ref >39)
LDL Calculated: 92 mg/dL (ref 0–99)
Triglycerides: 111 mg/dL (ref 0–149)
VLDL Cholesterol Cal: 22 mg/dL (ref 5–40)

## 2018-05-21 LAB — COMPREHENSIVE METABOLIC PANEL
ALBUMIN: 4.7 g/dL (ref 4.0–5.0)
ALT: 38 IU/L (ref 0–44)
AST: 14 IU/L (ref 0–40)
Albumin/Globulin Ratio: 1.9 (ref 1.2–2.2)
Alkaline Phosphatase: 128 IU/L — ABNORMAL HIGH (ref 39–117)
BUN/Creatinine Ratio: 14 (ref 9–20)
BUN: 10 mg/dL (ref 6–24)
Bilirubin Total: 0.5 mg/dL (ref 0.0–1.2)
CHLORIDE: 102 mmol/L (ref 96–106)
CO2: 25 mmol/L (ref 20–29)
Calcium: 9.4 mg/dL (ref 8.7–10.2)
Creatinine, Ser: 0.7 mg/dL — ABNORMAL LOW (ref 0.76–1.27)
GFR calc Af Amer: 131 mL/min/{1.73_m2} (ref >59)
GFR calc non Af Amer: 113 mL/min/{1.73_m2} (ref >59)
GLUCOSE: 129 mg/dL — AB (ref 65–99)
Globulin, Total: 2.5 g/dL (ref 1.5–4.5)
Potassium: 4.2 mmol/L (ref 3.5–5.2)
Sodium: 142 mmol/L (ref 134–144)
Total Protein: 7.2 g/dL (ref 6.0–8.5)

## 2018-05-21 LAB — TSH: TSH: 1.2 u[IU]/mL (ref 0.450–4.500)

## 2018-05-21 LAB — CBC
Hematocrit: 43.4 % (ref 37.5–51.0)
Hemoglobin: 14.9 g/dL (ref 13.0–17.7)
MCH: 31.4 pg (ref 26.6–33.0)
MCHC: 34.3 g/dL (ref 31.5–35.7)
MCV: 91 fL (ref 79–97)
Platelets: 237 10*3/uL (ref 150–450)
RBC: 4.75 x10E6/uL (ref 4.14–5.80)
RDW: 11.7 % (ref 11.6–15.4)
WBC: 6.3 10*3/uL (ref 3.4–10.8)

## 2018-05-21 LAB — HEMOGLOBIN A1C
Est. average glucose Bld gHb Est-mCnc: 154 mg/dL
HEMOGLOBIN A1C: 7 % — AB (ref 4.8–5.6)

## 2018-05-21 LAB — MICROALBUMIN / CREATININE URINE RATIO
Creatinine, Urine: 201.2 mg/dL
Microalb/Creat Ratio: 5 mg/g creat (ref 0–29)
Microalbumin, Urine: 9.6 ug/mL

## 2018-07-17 ENCOUNTER — Ambulatory Visit: Payer: Self-pay | Admitting: Ophthalmology

## 2018-08-12 ENCOUNTER — Other Ambulatory Visit: Payer: Self-pay

## 2018-08-19 ENCOUNTER — Ambulatory Visit: Payer: Self-pay

## 2018-08-21 ENCOUNTER — Ambulatory Visit: Payer: Self-pay

## 2018-08-28 ENCOUNTER — Other Ambulatory Visit: Payer: Self-pay

## 2018-08-28 ENCOUNTER — Ambulatory Visit: Payer: Self-pay | Admitting: Adult Health Nurse Practitioner

## 2018-08-28 DIAGNOSIS — E785 Hyperlipidemia, unspecified: Secondary | ICD-10-CM

## 2018-08-28 DIAGNOSIS — E119 Type 2 diabetes mellitus without complications: Secondary | ICD-10-CM

## 2018-08-28 DIAGNOSIS — I10 Essential (primary) hypertension: Secondary | ICD-10-CM

## 2018-08-28 NOTE — Progress Notes (Signed)
  Patient: Hector Weber Male    DOB: Mar 13, 1971   48 y.o.   MRN: [id number] Visit Date: 08/28/2018  Today's Provider: Jacelyn Pi, NP   No chief complaint on file.  Subjective:    HPI  Telephonic visit.    Taking medications as directed.  Currently stable with no concerns.  Last lipid panel LDL-92 A1c was 7.    No Known Allergies Previous Medications   ATORVASTATIN (LIPITOR) 40 MG TABLET    Take 1 tablet (40 mg total) by mouth daily.   CARVEDILOL (COREG) 12.5 MG TABLET    Take 1 tablet (12.5 mg total) by mouth 2 (two) times daily with a meal.   METFORMIN (GLUCOPHAGE) 1000 MG TABLET    Take 1 tablet (1,000 mg total) by mouth 2 (two) times daily with a meal. Reported on 08/30/2015    Review of Systems  All other systems reviewed and are negative.   Social History   Tobacco Use  . Smoking status: Current Some Day Smoker    Packs/day: 0.25    Types: Cigarettes  . Smokeless tobacco: Former Neurosurgeon    Types: Snuff, Chew  Substance Use Topics  . Alcohol use: Yes    Alcohol/week: 6.0 standard drinks    Types: 6 Cans of beer per week    Comment: 6 pack/month   Objective:   There were no vitals taken for this visit.  Physical Exam  No PE.      Assessment & Plan:         HTN:  Goal BP <140/90.  Continue current medication regimen.  Encourage low salt diet and exercise.   HLD:  Continue current regimen.  Encourage low cholesterol, low fat diet and exercise.   DM:  Encourage diabetic diet and exercise.  Continue current medication regimen.   No labs due to scheduling conflicts- will obtain labs a week prior to next visit.  Last labs stable.    Jacelyn Pi, NP   [facility]

## 2018-10-06 ENCOUNTER — Other Ambulatory Visit: Payer: Self-pay | Admitting: Adult Health Nurse Practitioner

## 2018-11-20 ENCOUNTER — Other Ambulatory Visit: Payer: Self-pay

## 2018-11-20 DIAGNOSIS — E785 Hyperlipidemia, unspecified: Secondary | ICD-10-CM

## 2018-11-20 DIAGNOSIS — E119 Type 2 diabetes mellitus without complications: Secondary | ICD-10-CM

## 2018-11-21 LAB — COMPREHENSIVE METABOLIC PANEL WITH GFR
ALT: 25 [IU]/L (ref 0–44)
AST: 20 [IU]/L (ref 0–40)
Albumin/Globulin Ratio: 2 (ref 1.2–2.2)
Albumin: 4.7 g/dL (ref 4.0–5.0)
Alkaline Phosphatase: 78 [IU]/L (ref 39–117)
BUN/Creatinine Ratio: 16 (ref 9–20)
BUN: 10 mg/dL (ref 6–24)
Bilirubin Total: 0.6 mg/dL (ref 0.0–1.2)
CO2: 24 mmol/L (ref 20–29)
Calcium: 9 mg/dL (ref 8.7–10.2)
Chloride: 100 mmol/L (ref 96–106)
Creatinine, Ser: 0.62 mg/dL — ABNORMAL LOW (ref 0.76–1.27)
GFR calc Af Amer: 138 mL/min/{1.73_m2}
GFR calc non Af Amer: 119 mL/min/{1.73_m2}
Globulin, Total: 2.3 g/dL (ref 1.5–4.5)
Glucose: 127 mg/dL — ABNORMAL HIGH (ref 65–99)
Potassium: 3.6 mmol/L (ref 3.5–5.2)
Sodium: 140 mmol/L (ref 134–144)
Total Protein: 7 g/dL (ref 6.0–8.5)

## 2018-11-21 LAB — LIPID PANEL
Chol/HDL Ratio: 2.1 ratio (ref 0.0–5.0)
Cholesterol, Total: 119 mg/dL (ref 100–199)
HDL: 57 mg/dL
LDL Calculated: 48 mg/dL (ref 0–99)
Triglycerides: 71 mg/dL (ref 0–149)
VLDL Cholesterol Cal: 14 mg/dL (ref 5–40)

## 2018-11-21 LAB — HEMOGLOBIN A1C
Est. average glucose Bld gHb Est-mCnc: 180 mg/dL
Hgb A1c MFr Bld: 7.9 % — ABNORMAL HIGH (ref 4.8–5.6)

## 2018-11-24 ENCOUNTER — Other Ambulatory Visit: Payer: Self-pay | Admitting: Family Medicine

## 2018-11-24 DIAGNOSIS — E119 Type 2 diabetes mellitus without complications: Secondary | ICD-10-CM

## 2018-11-24 NOTE — Telephone Encounter (Signed)
Please call [facility] and let them know patient sent in refill request to [place].  Not sure who to forward to.

## 2018-11-27 ENCOUNTER — Other Ambulatory Visit: Payer: Self-pay

## 2018-11-27 ENCOUNTER — Ambulatory Visit: Payer: Self-pay | Admitting: Adult Health Nurse Practitioner

## 2018-11-27 VITALS — BP 125/80 | HR 91 | Temp 97.1°F | Ht 61.0 in | Wt 161.0 lb

## 2018-11-27 DIAGNOSIS — I10 Essential (primary) hypertension: Secondary | ICD-10-CM

## 2018-11-27 DIAGNOSIS — E119 Type 2 diabetes mellitus without complications: Secondary | ICD-10-CM

## 2018-11-27 DIAGNOSIS — E785 Hyperlipidemia, unspecified: Secondary | ICD-10-CM

## 2018-11-27 MED ORDER — CARVEDILOL 12.5 MG PO TABS: ORAL_TABLET | ORAL | 5 refills | Status: DC

## 2018-11-27 MED ORDER — ATORVASTATIN CALCIUM 40 MG PO TABS: 40.0000 mg | ORAL_TABLET | Freq: Every day | ORAL | 5 refills | Status: DC

## 2018-11-27 MED ORDER — METFORMIN HCL 1000 MG PO TABS: 1000.0000 mg | ORAL_TABLET | Freq: Two times a day (BID) | ORAL | 5 refills | Status: DC

## 2018-11-27 NOTE — Progress Notes (Signed)
  Patient: Hector Weber Male    DOB: 1970-05-25   48 y.o.   MRN: [id number] Visit Date: 11/27/2018  Today's Provider: [facility]   Chief Complaint  Patient presents with  . Follow-up    Follow up to labs   Subjective:    HPI   A1c up from 7 to 7.9. States that he has been eating tortillas rice and beans- he states that he has cut back over the past two months.    LDL-48. LFTs WNL.    No Known Allergies Previous Medications   ATORVASTATIN (LIPITOR) 40 MG TABLET    TAKE 1 TABLET BY MOUTH DAILY   CARVEDILOL (COREG) 12.5 MG TABLET    TAKE 1 TABLET BY MOUTH TWICE A DAY WITH A MEAL   METFORMIN (GLUCOPHAGE) 1000 MG TABLET    Take 1 tablet (1,000 mg total) by mouth 2 (two) times daily with a meal. Reported on 08/30/2015    Review of Systems  All other systems reviewed and are negative.   Social History   Tobacco Use  . Smoking status: Current Some Day Smoker    Packs/day: 0.25    Types: Cigarettes  . Smokeless tobacco: Former Systems developer    Types: Snuff, Chew  Substance Use Topics  . Alcohol use: Yes    Alcohol/week: 6.0 standard drinks    Types: 6 Cans of beer per week    Comment: 6 pack/week   Objective:   BP 125/80   Pulse 91   Temp (!) 97.1 F (36.2 C)   SpO2 98%   Physical Exam Constitutional:      Appearance: Normal appearance.  HENT:     Head: Normocephalic and atraumatic.  Cardiovascular:     Rate and Rhythm: Normal rate and regular rhythm.  Pulmonary:     Effort: Pulmonary effort is normal.     Breath sounds: Normal breath sounds.  Abdominal:     General: Bowel sounds are normal.     Palpations: Abdomen is soft.  Skin:    General: Skin is warm and dry.  Neurological:     Mental Status: [name] alert.         Assessment & Plan:         DM:  Goal <7.   Not controlled.  Encourage strict diabetic diet and exercise. Limit tortillas and rice- discussed if A1c not at goal at next OV will need to add an additional medication.  Continue  current medication regimen.   HTN:  Controlled. .  Goal BP <140/90.  Continue current medication regimen.  Encourage low salt diet and exercise.   HLD:  Controlled.  Continue current regimen.  Encourage low cholesterol, low fat diet and exercise.        [facility]

## 2019-02-25 ENCOUNTER — Other Ambulatory Visit: Payer: Self-pay | Admitting: Ophthalmology

## 2019-02-26 ENCOUNTER — Other Ambulatory Visit: Payer: Self-pay

## 2019-02-26 DIAGNOSIS — E119 Type 2 diabetes mellitus without complications: Secondary | ICD-10-CM

## 2019-02-27 LAB — COMPREHENSIVE METABOLIC PANEL
ALT: 29 IU/L (ref 0–44)
AST: 20 IU/L (ref 0–40)
Albumin/Globulin Ratio: 1.9 (ref 1.2–2.2)
Albumin: 4.7 g/dL (ref 4.0–5.0)
Alkaline Phosphatase: 75 IU/L (ref 39–117)
BUN/Creatinine Ratio: 18 (ref 9–20)
BUN: 12 mg/dL (ref 6–24)
Bilirubin Total: 0.7 mg/dL (ref 0.0–1.2)
CO2: 24 mmol/L (ref 20–29)
Calcium: 9 mg/dL (ref 8.7–10.2)
Chloride: 101 mmol/L (ref 96–106)
Creatinine, Ser: 0.66 mg/dL — ABNORMAL LOW (ref 0.76–1.27)
GFR calc Af Amer: 134 mL/min/{1.73_m2} (ref >59)
GFR calc non Af Amer: 116 mL/min/{1.73_m2} (ref >59)
Globulin, Total: 2.5 g/dL (ref 1.5–4.5)
Glucose: 128 mg/dL — ABNORMAL HIGH (ref 65–99)
Potassium: 3.8 mmol/L (ref 3.5–5.2)
Sodium: 140 mmol/L (ref 134–144)
Total Protein: 7.2 g/dL (ref 6.0–8.5)

## 2019-02-27 LAB — HEMOGLOBIN A1C
Est. average glucose Bld gHb Est-mCnc: 166 mg/dL
Hgb A1c MFr Bld: 7.4 % — ABNORMAL HIGH (ref 4.8–5.6)

## 2019-03-12 ENCOUNTER — Ambulatory Visit: Payer: Self-pay | Admitting: Family Medicine

## 2019-03-12 ENCOUNTER — Encounter: Payer: Self-pay | Admitting: Family Medicine

## 2019-03-12 ENCOUNTER — Other Ambulatory Visit: Payer: Self-pay

## 2019-03-12 VITALS — BP 123/77 | HR 85 | Temp 96.6°F | Ht 61.0 in | Wt 166.1 lb

## 2019-03-12 DIAGNOSIS — I10 Essential (primary) hypertension: Secondary | ICD-10-CM

## 2019-03-12 DIAGNOSIS — E785 Hyperlipidemia, unspecified: Secondary | ICD-10-CM

## 2019-03-12 DIAGNOSIS — E119 Type 2 diabetes mellitus without complications: Secondary | ICD-10-CM

## 2019-03-12 DIAGNOSIS — Z114 Encounter for screening for human immunodeficiency virus [HIV]: Secondary | ICD-10-CM

## 2019-03-12 MED ORDER — CARVEDILOL 12.5 MG PO TABS: ORAL_TABLET | ORAL | 5 refills | Status: DC

## 2019-03-12 MED ORDER — GLIPIZIDE ER 5 MG PO TB24: 5.0000 mg | ORAL_TABLET | Freq: Every day | ORAL | 2 refills | Status: DC

## 2019-03-12 MED ORDER — ATORVASTATIN CALCIUM 40 MG PO TABS: 40.0000 mg | ORAL_TABLET | Freq: Every day | ORAL | 5 refills | Status: DC

## 2019-03-12 MED ORDER — METFORMIN HCL 1000 MG PO TABS: 1000.0000 mg | ORAL_TABLET | Freq: Two times a day (BID) | ORAL | 5 refills | Status: DC

## 2019-03-12 NOTE — Progress Notes (Addendum)
Established Patient Office Visit  Subjective:  Patient ID: Hector Weber, male    DOB: February 08, 1971  Age: 48 y.o. MRN: [id number]  CC:  Chief Complaint  Patient presents with  . Diabetes    HPI Hector Weber presents for follow up on DM 2. Patient states that he does not check his FBS on a regular basis. He states that he is very active at work. [name] not following a carbohydrate modified diet or exercising. He reports compliance with medications. His last a1c reduced from 7.9 to 7.4. Patient states that his last eye exam was over 7 years ago.   Past Medical History:  Diagnosis Date  . Anemia   . Arthritis   . Chronic kidney disease   . Diabetes (HCC)   . Hyperlipidemia   . Hypertension   . Sleep apnea     No past surgical history on file.  Family History  Problem Relation Age of Onset  . Diabetes type II Mother   . Seizures Father     Social History   Socioeconomic History  . Marital status: Single    Spouse name: Not on file  . Number of children: 2  . Years of education: Not on file  . Highest education level: Not on file  Occupational History    Comment: restaurant  Social Needs  . Financial resource strain: Hard  . Food insecurity    Worry: Sometimes true    Inability: Sometimes true  . Transportation needs    Medical: Yes    Non-medical: Yes  Tobacco Use  . Smoking status: Current Some Day Smoker    Packs/day: 0.25    Types: Cigarettes  . Smokeless tobacco: Former NeurosurgeonUser    Types: Snuff, Chew  Substance and Sexual Activity  . Alcohol use: Yes    Alcohol/week: 6.0 standard drinks    Types: 6 Cans of beer per week    Comment: 6 pack/week  . Drug use: No  . Sexual activity: Not Currently    Birth control/protection: Coitus interruptus  Lifestyle  . Physical activity    Days per week: Not on file    Minutes per session: Not on file  . Stress: Not on file  Relationships  . Social Musicianconnections    Talks on phone: Not on file    Gets  together: Not on file    Attends religious service: Not on file    Active member of club or organization: Not on file    Attends meetings of clubs or organizations: Not on file    Relationship status: Not on file  . Intimate partner violence    Fear of current or ex partner: Not on file    Emotionally abused: Not on file    Physically abused: Not on file    Forced sexual activity: Not on file  Other Topics Concern  . Not on file  Social History Narrative   Pays lots in child support, which takes a lot of his money. Unsure if he has applied for food stamps before.    Outpatient Medications Prior to Visit  Medication Sig Dispense Refill  . atorvastatin (LIPITOR) 40 MG tablet Take 1 tablet (40 mg total) by mouth daily. 30 tablet 5  . carvedilol (COREG) 12.5 MG tablet TAKE 1 TABLET BY MOUTH TWICE A DAY WITH A MEAL 60 tablet 5  . metFORMIN (GLUCOPHAGE) 1000 MG tablet Take 1 tablet (1,000 mg total) by mouth 2 (two) times daily  with a meal. Reported on 08/30/2015 60 tablet 5   No facility-administered medications prior to visit.     No Known Allergies  ROS Review of Systems  Constitutional: Negative.   HENT: Negative.   Eyes: Negative.   Respiratory: Negative.   Cardiovascular: Negative.   Gastrointestinal: Negative.   Endocrine: Negative.   Genitourinary: Negative.   Musculoskeletal: Negative.   Skin: Negative.   Allergic/Immunologic: Negative.   Neurological: Negative.   Hematological: Negative.   Psychiatric/Behavioral: Negative.       Objective:    Physical Exam  Constitutional: [name] oriented to person, place, and time. He appears well-developed and well-nourished. No distress.  HENT:  Head: Normocephalic and atraumatic.  Eyes: Pupils are equal, round, and reactive to light. Conjunctivae and EOM are normal.  Neck: Normal range of motion.  Cardiovascular: Normal rate, regular rhythm and normal heart sounds.  Pulmonary/Chest: Effort normal and breath sounds normal.  No respiratory distress.  Musculoskeletal: Normal range of motion.  Neurological: [name] alert and oriented to person, place, and time.  Skin: Skin is warm and dry.  Psychiatric: He has a normal mood and affect. His behavior is normal. Judgment and thought content normal.  Nursing note and vitals reviewed.   BP 123/77   Pulse 85   Temp (!) 96.6 F (35.9 C)   Ht 5\' 1"  (1.549 m)   Wt 166 lb 1.6 oz (75.3 kg)   BMI 31.38 kg/m  Wt Readings from Last 3 Encounters:  03/12/19 166 lb 1.6 oz (75.3 kg)  11/27/18 161 lb (73 kg)  05/05/18 160 lb (72.6 kg)     Health Maintenance Due  Topic Date Due  . PNEUMOCOCCAL POLYSACCHARIDE VACCINE AGE 29-64 HIGH RISK  04/24/1974  . HIV Screening  04/25/1987  . TETANUS/TDAP  04/25/1991  . OPHTHALMOLOGY EXAM  02/20/2013  . FOOT EXAM  02/19/2019    There are no preventive care reminders to display for this patient.  Lab Results  Component Value Date   TSH 1.200 05/20/2018   Lab Results  Component Value Date   WBC 6.3 05/20/2018   HGB 14.9 05/20/2018   HCT 43.4 05/20/2018   MCV 91 05/20/2018   PLT 237 05/20/2018   Lab Results  Component Value Date   NA 140 02/26/2019   K 3.8 02/26/2019   CO2 24 02/26/2019   GLUCOSE 128 (H) 02/26/2019   BUN 12 02/26/2019   CREATININE 0.66 (L) 02/26/2019   BILITOT 0.7 02/26/2019   ALKPHOS 75 02/26/2019   AST 20 02/26/2019   ALT 29 02/26/2019   PROT 7.2 02/26/2019   ALBUMIN 4.7 02/26/2019   CALCIUM 9.0 02/26/2019   Lab Results  Component Value Date   CHOL 119 11/20/2018   Lab Results  Component Value Date   HDL 57 11/20/2018   Lab Results  Component Value Date   LDLCALC 48 11/20/2018   Lab Results  Component Value Date   TRIG 71 11/20/2018   Lab Results  Component Value Date   CHOLHDL 2.1 11/20/2018   Lab Results  Component Value Date   HGBA1C 7.4 (H) 02/26/2019      Assessment & Plan:   Problem List Items Addressed This Visit      Cardiovascular and Mediastinum   Essential  hypertension   Relevant Medications   carvedilol (COREG) 12.5 MG tablet   atorvastatin (LIPITOR) 40 MG tablet     Endocrine   Diabetes mellitus without complication ([place]) - Primary   Relevant Medications  glipiZIDE (GLUCOTROL XL) 5 MG 24 hr tablet   metFORMIN (GLUCOPHAGE) 1000 MG tablet   atorvastatin (LIPITOR) 40 MG tablet   Other Relevant Orders   CBC With Differential - Future   Comprehensive metabolic panel   Lipid Panel With LDL/HDL Ratio   HgB A1c     Other   Hyperlipidemia LDL goal <70   Relevant Medications   carvedilol (COREG) 12.5 MG tablet   atorvastatin (LIPITOR) 40 MG tablet      Meds ordered this encounter  Medications  . glipiZIDE (GLUCOTROL XL) 5 MG 24 hr tablet    Sig: Take 1 tablet (5 mg total) by mouth daily with breakfast.    Dispense:  30 tablet    Refill:  2  . carvedilol (COREG) 12.5 MG tablet    Sig: TAKE 1 TABLET BY MOUTH TWICE A DAY WITH A MEAL    Dispense:  60 tablet    Refill:  5  . metFORMIN (GLUCOPHAGE) 1000 MG tablet    Sig: Take 1 tablet (1,000 mg total) by mouth 2 (two) times daily with a meal. Reported on 08/30/2015    Dispense:  60 tablet    Refill:  5  . atorvastatin (LIPITOR) 40 MG tablet    Sig: Take 1 tablet (40 mg total) by mouth daily.    Dispense:  30 tablet    Refill:  5   Start long acting glipizide 5 mg with breakfast as A1c is still not at goal. Discussed carb modified diet, increasing water, and exercise.  Follow-up: Return in about 3 months (around 06/10/2019), or 1 week prior to appt for fasting labs, for HTN and DM2.    Mike Gip, FNP

## 2019-03-12 NOTE — Patient Instructions (Signed)
Mantenimiento de la salud en los hombres Health Maintenance, Male Adoptar un estilo de vida saludable y recibir atencin preventiva son importantes para promover la salud y el bienestar. Consulte al mdico sobre:  El esquema adecuado para hacerse pruebas y exmenes peridicos.  Cosas que puede hacer por su cuenta para prevenir enfermedades y mantenerse sano. Qu debo saber sobre la dieta, el peso y el ejercicio? Consuma una dieta saludable   Consuma una dieta que incluya muchas verduras, frutas, productos lcteos con bajo contenido de grasa y protenas magras.  No consuma muchos alimentos ricos en grasas slidas, azcares agregados o sodio. Mantenga un peso saludable El ndice de masa muscular (IMC) es una medida que puede utilizarse para identificar posibles problemas de peso. Proporciona una estimacin de la grasa corporal basndose en el peso y la altura. Su mdico puede ayudarle a determinar su IMC y a lograr o mantener un peso saludable. Haga ejercicio con regularidad Haga ejercicio con regularidad. Esta es una de las prcticas ms importantes que puede hacer por su salud. La mayora de los adultos deben seguir estas pautas:  Realizar, al menos, 150minutos de actividad fsica por semana. El ejercicio debe aumentar la frecuencia cardaca y hacerlo transpirar (ejercicio de intensidad moderada).  Hacer ejercicios de fortalecimiento por lo menos dos veces por semana. Agregue esto a su plan de ejercicio de intensidad moderada.  Pasar menos tiempo sentados. Incluso la actividad fsica ligera puede ser beneficiosa. Controle sus niveles de colesterol y lpidos en la sangre Comience a realizarse anlisis de lpidos y colesterol en la sangre a los 20aos y luego reptalos cada 5aos. Es posible que necesite controlar los niveles de colesterol con mayor frecuencia si:  Sus niveles de lpidos y colesterol son altos.  Es mayor de 40aos.  Presenta un alto riesgo de padecer enfermedades  cardacas. Qu debo saber sobre las pruebas de deteccin del cncer? Muchos tipos de cncer pueden detectarse de manera temprana y, a menudo, pueden prevenirse. Segn su historia clnica y sus antecedentes familiares, es posible que deba realizarse pruebas de deteccin del cncer en diferentes edades. Esto puede incluir pruebas de deteccin de lo siguiente:  Cncer colorrectal.  Cncer de prstata.  Cncer de piel.  Cncer de pulmn. Qu debo saber sobre la enfermedad cardaca, la diabetes y la hipertensin arterial? Presin arterial y enfermedad cardaca  La hipertensin arterial causa enfermedades cardacas y aumenta el riesgo de accidente cerebrovascular. Es ms probable que esto se manifieste en las personas que tienen lecturas de presin arterial alta, tienen ascendencia africana o tienen sobrepeso.  Hable con el mdico sobre sus valores de presin arterial deseados.  Hgase controlar la presin arterial: ? Cada 3 a 5 aos si tiene entre 18 y 39 aos. ? Todos los aos si es mayor de 40aos.  Si tiene entre 65 y 75 aos y es fumador o sola fumar, pregntele al mdico si debe realizarse una prueba de deteccin de aneurisma artico abdominal (AAA) por nica vez. Diabetes Realcese exmenes de deteccin de la diabetes con regularidad. Este anlisis revisa el nivel de azcar en la sangre en ayunas. Hgase las pruebas de deteccin:  Cada tresaos despus de los 45aos de edad si tiene un peso normal y un bajo riesgo de padecer diabetes.  Con ms frecuencia y a partir de una edad inferior si tiene sobrepeso o un alto riesgo de padecer diabetes. Qu debo saber sobre la prevencin de infecciones? Hepatitis B Si tiene un riesgo ms alto de contraer hepatitis B, debe someterse   a un examen de deteccin de este virus. Hable con el mdico para averiguar si tiene riesgo de contraer la infeccin por hepatitis B. Hepatitis C Se recomienda un anlisis de sangre para:  Todos los que  nacieron entre 1945 y 1965.  Todas las personas que tengan un riesgo de haber contrado hepatitis C. Enfermedades de transmisin sexual (ETS)  Debe realizarse pruebas de deteccin de ITS todos los aos, incluidas la gonorrea y la clamidia, si: ? Es sexualmente activo y es menor de 24aos. ? Es mayor de 24aos, y el mdico le informa que corre riesgo de tener este tipo de infecciones. ? La actividad sexual ha cambiado desde que le hicieron la ltima prueba de deteccin y tiene un riesgo mayor de tener clamidia o gonorrea. Pregntele al mdico si usted tiene riesgo.  Pregntele al mdico si usted tiene un alto riesgo de contraer VIH. El mdico tambin puede recomendarle un medicamento recetado para ayudar a evitar la infeccin por el VIH. Si elige tomar medicamentos para prevenir el VIH, primero debe hacerse los anlisis de deteccin del VIH. Luego debe hacerse anlisis cada 3meses mientras est tomando los medicamentos. Siga estas instrucciones en su casa: Estilo de vida  No consuma ningn producto que contenga nicotina o tabaco, como cigarrillos, cigarrillos electrnicos y tabaco de mascar. Si necesita ayuda para dejar de fumar, consulte al mdico.  No consuma drogas.  No comparta agujas.  Solicite ayuda a su mdico si necesita apoyo o informacin para abandonar las drogas. Consumo de alcohol  No beba alcohol si el mdico se lo prohbe.  Si bebe alcohol: ? Limite la cantidad que consume de 0 a 2 medidas por da. ? Est atento a la cantidad de alcohol que hay en las bebidas que toma. En [place], una medida equivale a una botella de cerveza de 12oz (355ml), un vaso de vino de 5oz (148ml) o un vaso de una bebida alcohlica de alta graduacin de 1oz (44ml). Instrucciones generales  Realcese los estudios de rutina de la salud, dentales y de la vista.  Mantngase al da con las vacunas.  Infrmele a su mdico si: ? Se siente deprimido con frecuencia. ? Alguna vez  ha sido vctima de maltrato o no se siente seguro en su casa. Resumen  Adoptar un estilo de vida saludable y recibir atencin preventiva son importantes para promover la salud y el bienestar.  Siga las instrucciones del mdico acerca de una dieta saludable, el ejercicio y la realizacin de pruebas o exmenes para detectar enfermedades.  Siga las instrucciones del mdico con respecto al control del colesterol y la presin arterial. Esta informacin no tiene como fin reemplazar el consejo del mdico. Asegrese de hacerle al mdico cualquier pregunta que tenga. Document Released: 09/22/2007 Document Revised: 04/16/2018 Document Reviewed: 04/16/2018 Elsevier Patient Education  2020 Elsevier Inc.  

## 2019-06-11 ENCOUNTER — Other Ambulatory Visit: Payer: Self-pay

## 2019-06-18 ENCOUNTER — Other Ambulatory Visit: Payer: Self-pay

## 2019-06-22 ENCOUNTER — Other Ambulatory Visit: Payer: Self-pay | Admitting: Family Medicine

## 2019-06-22 DIAGNOSIS — E119 Type 2 diabetes mellitus without complications: Secondary | ICD-10-CM

## 2019-06-23 ENCOUNTER — Other Ambulatory Visit: Payer: Self-pay | Admitting: Family Medicine

## 2019-06-23 DIAGNOSIS — E119 Type 2 diabetes mellitus without complications: Secondary | ICD-10-CM

## 2019-06-25 ENCOUNTER — Ambulatory Visit: Payer: Self-pay

## 2019-07-02 ENCOUNTER — Ambulatory Visit: Payer: Self-pay | Admitting: Urology

## 2019-07-02 ENCOUNTER — Other Ambulatory Visit: Payer: Self-pay

## 2019-07-02 VITALS — BP 131/78 | HR 81 | Temp 97.0°F | Ht 64.0 in | Wt 166.8 lb

## 2019-07-02 DIAGNOSIS — I10 Essential (primary) hypertension: Secondary | ICD-10-CM

## 2019-07-02 DIAGNOSIS — E119 Type 2 diabetes mellitus without complications: Secondary | ICD-10-CM

## 2019-07-02 DIAGNOSIS — E785 Hyperlipidemia, unspecified: Secondary | ICD-10-CM

## 2019-07-02 MED ORDER — GLIPIZIDE ER 5 MG PO TB24: 5.0000 mg | ORAL_TABLET | Freq: Every day | ORAL | 2 refills | Status: DC

## 2019-07-02 MED ORDER — ATORVASTATIN CALCIUM 40 MG PO TABS: 40.0000 mg | ORAL_TABLET | Freq: Every day | ORAL | 5 refills | Status: DC

## 2019-07-02 MED ORDER — METFORMIN HCL 1000 MG PO TABS: 1000.0000 mg | ORAL_TABLET | Freq: Two times a day (BID) | ORAL | 5 refills | Status: DC

## 2019-07-02 MED ORDER — CARVEDILOL 12.5 MG PO TABS: ORAL_TABLET | ORAL | 5 refills | Status: DC

## 2019-07-02 NOTE — Progress Notes (Signed)
  Patient: Hector Weber Male    DOB: 1970/07/04   49 y.o.   MRN: [id number] Visit Date: 07/02/2019  Today's Provider: [facility]   Chief Complaint  Patient presents with  . Follow-up    Blood work  . Nephropathy   Subjective:    HPI DM  - not checking FBS as he doesn't have time  - states he is taking his medication   - no recent labs   - no complaints of nephropathy  HTN  - BP at goal  - taking medications  HLD  - taking medication  - no recent labs     No Known Allergies Previous Medications   No medications on file    Review of Systems  Social History   Tobacco Use  . Smoking status: Current Some Day Smoker    Packs/day: 0.25    Types: Cigarettes  . Smokeless tobacco: Former Neurosurgeon    Types: Snuff, Chew  Substance Use Topics  . Alcohol use: Yes    Alcohol/week: 2.0 standard drinks    Types: 2 Cans of beer per week    Comment: 2 drinks/week   Objective:   BP 131/78   Pulse 81   Temp (!) 97 F (36.1 C)   Ht 5\' 4"  (1.626 m)   Wt 166 lb 12.8 oz (75.7 kg)   SpO2 98%   BMI 28.63 kg/m   Physical Exam Constitutional:  Well nourished. Alert and oriented, No acute distress. HEENT: [place] AT, mask in place.  Trachea midline, no masses. Cardiovascular: No clubbing, cyanosis, or edema. Respiratory: Normal respiratory effort, no increased work of breathing. Neurologic: Grossly intact, no focal deficits, moving all 4 extremities. Psychiatric: Normal mood and affect.      Assessment & Plan:   1. Diabetes mellitus without complication (HCC) - glipiZIDE (GLUCOTROL XL) 5 MG 24 hr tablet; Take 1 tablet (5 mg total) by mouth daily with breakfast.  Dispense: 30 tablet; Refill: 2 - metFORMIN (GLUCOPHAGE) 1000 MG tablet; Take 1 tablet (1,000 mg total) by mouth 2 (two) times daily with a meal. Reported on 08/30/2015  Dispense: 60 tablet; Refill: 5 - request eye exam due to diabetes   2. Hyperlipidemia LDL goal <70 - atorvastatin (LIPITOR) 40 MG  tablet; Take 1 tablet (40 mg total) by mouth daily.  Dispense: 30 tablet; Refill: 5  3. Essential hypertension - carvedilol (COREG) 12.5 MG tablet; TAKE 1 TABLET BY MOUTH TWICE A DAY WITH A MEAL  Dispense: 60 tablet; Refill: 5     [facility]   [facility]

## 2019-07-13 ENCOUNTER — Ambulatory Visit: Payer: Self-pay | Attending: Internal Medicine

## 2019-07-13 ENCOUNTER — Other Ambulatory Visit: Payer: Self-pay

## 2019-07-13 DIAGNOSIS — Z23 Encounter for immunization: Secondary | ICD-10-CM

## 2019-07-23 ENCOUNTER — Other Ambulatory Visit: Payer: Self-pay

## 2019-07-23 DIAGNOSIS — Z114 Encounter for screening for human immunodeficiency virus [HIV]: Secondary | ICD-10-CM

## 2019-07-23 DIAGNOSIS — E119 Type 2 diabetes mellitus without complications: Secondary | ICD-10-CM

## 2019-07-24 LAB — CBC WITH DIFFERENTIAL
Basophils Absolute: 0 10*3/uL (ref 0.0–0.2)
Basos: 1 %
EOS (ABSOLUTE): 0.1 10*3/uL (ref 0.0–0.4)
Eos: 1 %
Hematocrit: 45.6 % (ref 37.5–51.0)
Hemoglobin: 15.8 g/dL (ref 13.0–17.7)
Immature Grans (Abs): 0 10*3/uL (ref 0.0–0.1)
Immature Granulocytes: 0 %
Lymphocytes Absolute: 1.9 10*3/uL (ref 0.7–3.1)
Lymphs: 37 %
MCH: 31 pg (ref 26.6–33.0)
MCHC: 34.6 g/dL (ref 31.5–35.7)
MCV: 89 fL (ref 79–97)
Monocytes Absolute: 0.5 10*3/uL (ref 0.1–0.9)
Monocytes: 9 %
Neutrophils Absolute: 2.7 10*3/uL (ref 1.4–7.0)
Neutrophils: 52 %
RBC: 5.1 x10E6/uL (ref 4.14–5.80)
RDW: 11.8 % (ref 11.6–15.4)
WBC: 5.3 10*3/uL (ref 3.4–10.8)

## 2019-07-24 LAB — COMPREHENSIVE METABOLIC PANEL
ALT: 30 IU/L (ref 0–44)
AST: 19 IU/L (ref 0–40)
Albumin/Globulin Ratio: 1.7 (ref 1.2–2.2)
Albumin: 4.5 g/dL (ref 4.0–5.0)
Alkaline Phosphatase: 101 IU/L (ref 39–117)
BUN/Creatinine Ratio: 19 (ref 9–20)
BUN: 13 mg/dL (ref 6–24)
Bilirubin Total: 0.7 mg/dL (ref 0.0–1.2)
CO2: 24 mmol/L (ref 20–29)
Calcium: 9.5 mg/dL (ref 8.7–10.2)
Chloride: 102 mmol/L (ref 96–106)
Creatinine, Ser: 0.68 mg/dL — ABNORMAL LOW (ref 0.76–1.27)
GFR calc Af Amer: 131 mL/min/{1.73_m2} (ref >59)
GFR calc non Af Amer: 114 mL/min/{1.73_m2} (ref >59)
Globulin, Total: 2.6 g/dL (ref 1.5–4.5)
Glucose: 147 mg/dL — ABNORMAL HIGH (ref 65–99)
Potassium: 4 mmol/L (ref 3.5–5.2)
Sodium: 142 mmol/L (ref 134–144)
Total Protein: 7.1 g/dL (ref 6.0–8.5)

## 2019-07-24 LAB — LIPID PANEL WITH LDL/HDL RATIO
Cholesterol, Total: 115 mg/dL (ref 100–199)
HDL: 44 mg/dL (ref >39)
LDL Chol Calc (NIH): 54 mg/dL (ref 0–99)
LDL/HDL Ratio: 1.2 ratio (ref 0.0–3.6)
Triglycerides: 84 mg/dL (ref 0–149)
VLDL Cholesterol Cal: 17 mg/dL (ref 5–40)

## 2019-07-24 LAB — HEMOGLOBIN A1C
Est. average glucose Bld gHb Est-mCnc: 200 mg/dL
Hgb A1c MFr Bld: 8.6 % — ABNORMAL HIGH (ref 4.8–5.6)

## 2019-07-24 LAB — HIV ANTIBODY (ROUTINE TESTING W REFLEX): HIV Screen 4th Generation wRfx: NONREACTIVE

## 2019-07-30 ENCOUNTER — Ambulatory Visit: Payer: Self-pay | Admitting: Urology

## 2019-07-30 VITALS — BP 119/73 | HR 88 | Temp 98.0°F | Ht 62.99 in | Wt 165.0 lb

## 2019-07-30 DIAGNOSIS — E785 Hyperlipidemia, unspecified: Secondary | ICD-10-CM

## 2019-07-30 DIAGNOSIS — E119 Type 2 diabetes mellitus without complications: Secondary | ICD-10-CM

## 2019-07-30 DIAGNOSIS — I10 Essential (primary) hypertension: Secondary | ICD-10-CM

## 2019-07-30 MED ORDER — GLIPIZIDE ER 5 MG PO TB24: 5.0000 mg | ORAL_TABLET | Freq: Every day | ORAL | 2 refills | Status: DC

## 2019-07-30 NOTE — Progress Notes (Signed)
  Patient: Hector Weber Male    DOB: 12-19-1970   49 y.o.   MRN: [id number] Visit Date: 07/30/2019  Today's Provider: [facility]   No chief complaint on file.  Subjective:    HPI   Reviewed labs  Hgb A1c has increased from 7.4 to 8.6  [name] not taking his glipizide 5 mg daily because he states it makes him feel bad.  He has limited insight into his diabetes.    No Known Allergies Previous Medications   ATORVASTATIN (LIPITOR) 40 MG TABLET    Take 1 tablet (40 mg total) by mouth daily.   CARVEDILOL (COREG) 12.5 MG TABLET    TAKE 1 TABLET BY MOUTH TWICE A DAY WITH A MEAL   GLIPIZIDE (GLUCOTROL XL) 5 MG 24 HR TABLET    Take 1 tablet (5 mg total) by mouth daily with breakfast.   METFORMIN (GLUCOPHAGE) 1000 MG TABLET    Take 1 tablet (1,000 mg total) by mouth 2 (two) times daily with a meal. Reported on 08/30/2015    Review of Systems  Social History   Tobacco Use  . Smoking status: Current Some Day Smoker    Packs/day: 0.25    Types: Cigarettes  . Smokeless tobacco: Former Neurosurgeon    Types: Snuff, Chew  Substance Use Topics  . Alcohol use: Yes    Alcohol/week: 2.0 standard drinks    Types: 2 Cans of beer per week    Comment: 2 drinks/week   Objective:   BP 119/73   Pulse 88   Temp 98 F (36.7 C)   Ht 5' 2.99" (1.6 m)   Wt 165 lb (74.8 kg)   SpO2 99%   BMI 29.24 kg/m   Physical Exam      Assessment & Plan:    1. Diabetes mellitus without complication (HCC) - will take metformin BID with meals - will take glipizide 5 mg QD after lunch - will give glucometer, test strips and lancets  - RTC to review CBS - if not under better control consider adding Trulicity   2. Hyperlipidemia LDL goal <70 - continue Lipitor 40 mg qhs  3. Essential hypertension - continue Norvasc 5 mg   [facility]   [facility]

## 2019-07-30 NOTE — Patient Instructions (Addendum)
Metformin 1000 mg in the morning with breakfast  Glipizide 5 mg in the afternoon with lunch  Metformin 1000 mg in the evening with dinner   Atorvastatin 40 mg at night

## 2019-08-05 ENCOUNTER — Ambulatory Visit: Payer: Self-pay | Attending: Internal Medicine

## 2019-08-05 DIAGNOSIS — Z23 Encounter for immunization: Secondary | ICD-10-CM

## 2019-08-05 NOTE — Progress Notes (Signed)
   Covid-19 Vaccination Clinic  Name:  Jaron Czarnecki    MRN: [id number] DOB: May 06, 1970  08/05/2019  Mr. Caulin Begley was observed post Covid-19 immunization for 15 minutes without incident. He was provided with Vaccine Information Sheet and instruction to access the V-Safe system.   Mr. Doran Nestle was instructed to call 911 with any severe reactions post vaccine: Marland Kitchen Difficulty breathing  . Swelling of face and throat  . A fast heartbeat  . A bad rash all over body  . Dizziness and weakness   Immunizations Administered    Name Date Dose VIS Date Route   Pfizer COVID-19 Vaccine 08/05/2019 10:44 AM 0.3 mL 06/03/2018 Intramuscular   Manufacturer: [employer], [employer]   Lot: [id number]   NDC: [address]-2

## 2019-08-27 ENCOUNTER — Ambulatory Visit: Payer: Self-pay

## 2019-09-08 ENCOUNTER — Ambulatory Visit: Payer: Self-pay

## 2019-10-07 ENCOUNTER — Ambulatory Visit: Payer: Self-pay | Admitting: Gerontology

## 2019-10-15 ENCOUNTER — Other Ambulatory Visit: Payer: Self-pay

## 2019-10-15 ENCOUNTER — Encounter: Payer: Self-pay | Admitting: Gerontology

## 2019-10-15 ENCOUNTER — Ambulatory Visit: Payer: Self-pay | Admitting: Gerontology

## 2019-10-15 VITALS — BP 123/83 | HR 90 | Ht 64.0 in | Wt 168.0 lb

## 2019-10-15 DIAGNOSIS — E119 Type 2 diabetes mellitus without complications: Secondary | ICD-10-CM

## 2019-10-15 LAB — POCT GLYCOSYLATED HEMOGLOBIN (HGB A1C): Hemoglobin A1C: 8.2 % — AB (ref 4.0–5.6)

## 2019-10-15 MED ORDER — GLIPIZIDE ER 10 MG PO TB24: 10.0000 mg | ORAL_TABLET | Freq: Every day | ORAL | 3 refills | Status: DC

## 2019-10-15 NOTE — Progress Notes (Signed)
Established Patient Office Visit  Subjective:  Patient ID: Hector Weber, male    DOB: 04-Dec-1970  Age: 49 y.o. MRN: [id number]  CC:  Chief Complaint  Patient presents with  . Diabetes    HPI Hector Weber presents for follow up for diabetes. He reports he is checking his blood sugar every day and says he averages around 175 but can get up as high as 270. It was 232 in the office today, he reports he has not had anything to eat or drink today besides water. He reports he's working on improving his diet and eating more vegetables. He reports smoking one or two cigars per week, he also reports drinking a few beers throughout the week. He needs refills on metformin and glipizide.  He reports taking his medications as directed and not missing doses. [name] checking is blood sugar at home once or twice per day. [name] avoiding foods like tortillas, pasta, and bread and trying to eat more salads and vegetables. He drinks mostly water.  He reports occasional headaches and some blurred vision. He saw an eye doctor last month and had a dilated eye exam. He got a prescription for glasses which he will pick up soon. He monitors his feet regularly for wounds and ulcers. He does have some occasional muscle soreness related to his job but denies peripheral numbness or tingling. Otherwise he states he is doing well and has no other complaints.  Past Medical History:  Diagnosis Date  . Anemia   . Arthritis   . Chronic kidney disease   . Diabetes (HCC)   . Hyperlipidemia   . Hypertension   . Sleep apnea     No past surgical history on file.  Family History  Problem Relation Age of Onset  . Diabetes type II Mother   . Seizures Father     Social History   Socioeconomic History  . Marital status: Single    Spouse name: Not on file  . Number of children: 2  . Years of education: Not on file  . Highest education level: Not on file  Occupational History    Comment: restaurant   Tobacco Use  . Smoking status: Current Some Day Smoker    Packs/day: 0.25    Types: Cigarettes  . Smokeless tobacco: Former Neurosurgeon    Types: Snuff, Chew  Vaping Use  . Vaping Use: Never used  Substance and Sexual Activity  . Alcohol use: Yes    Alcohol/week: 2.0 standard drinks    Types: 2 Cans of beer per week    Comment: 2 drinks/week  . Drug use: Not Currently  . Sexual activity: Not Currently    Birth control/protection: Coitus interruptus  Other Topics Concern  . Not on file  Social History Narrative   Pays lots in child support, which takes a lot of his money. Unsure if he has applied for food stamps before.   Social Determinants of Health   Financial Resource Strain: High Risk  . Difficulty of Paying Living Expenses: Hard  Food Insecurity: Food Insecurity Present  . Worried About Programme researcher, broadcasting/film/video in the Last Year: Sometimes true  . Ran Out of Food in the Last Year: Sometimes true  Transportation Needs: Unmet Transportation Needs  . Lack of Transportation (Medical): Yes  . Lack of Transportation (Non-Medical): Yes  Physical Activity:   . Days of Exercise per Week:   . Minutes of Exercise per Session:   Stress:   .  Feeling of Stress :   Social Connections:   . Frequency of Communication with Friends and Family:   . Frequency of Social Gatherings with Friends and Family:   . Attends Religious Services:   . Active Member of Clubs or Organizations:   . Attends Banker Meetings:   Marland Kitchen Marital Status:   Intimate Partner Violence:   . Fear of Current or Ex-Partner:   . Emotionally Abused:   Marland Kitchen Physically Abused:   . Sexually Abused:     Outpatient Medications Prior to Visit  Medication Sig Dispense Refill  . atorvastatin (LIPITOR) 40 MG tablet Take 1 tablet (40 mg total) by mouth daily. 30 tablet 5  . carvedilol (COREG) 12.5 MG tablet TAKE 1 TABLET BY MOUTH TWICE A DAY WITH A MEAL 60 tablet 5  . metFORMIN (GLUCOPHAGE) 1000 MG tablet Take 1 tablet  (1,000 mg total) by mouth 2 (two) times daily with a meal. Reported on 08/30/2015 60 tablet 5  . glipiZIDE (GLUCOTROL XL) 5 MG 24 hr tablet Take 1 tablet (5 mg total) by mouth daily with breakfast. 30 tablet 2   No facility-administered medications prior to visit.    No Known Allergies  ROS Review of Systems  Constitutional: Negative for fever.  HENT: Negative.   Eyes: Negative for pain and itching.  Respiratory: Negative for chest tightness and shortness of breath.   Cardiovascular: Negative for chest pain and leg swelling.  Gastrointestinal: Negative for abdominal pain, diarrhea and nausea.  Skin: Negative for rash.  Neurological: Negative for syncope.      Objective:    Physical Exam Constitutional:      Appearance: Normal appearance.  HENT:     Head: Normocephalic and atraumatic.  Eyes:     Pupils: Pupils are equal, round, and reactive to light.  Cardiovascular:     Rate and Rhythm: Normal rate and regular rhythm.     Heart sounds: No murmur heard.   Pulmonary:     Effort: Pulmonary effort is normal.     Breath sounds: Normal breath sounds.  Abdominal:     General: Bowel sounds are normal.     Palpations: Abdomen is soft.  Musculoskeletal:        General: Normal range of motion.     Right lower leg: No edema.     Left lower leg: No edema.  Feet:     Right foot:     Skin integrity: Skin integrity normal.     Left foot:     Skin integrity: Skin integrity normal.  Skin:    General: Skin is warm and dry.  Neurological:     Mental Status: [name] alert and oriented to person, place, and time.     BP 123/83 (BP Location: Right Arm, Patient Position: Sitting)   Pulse 90   Ht 5\' 4"  (1.626 m)   Wt 168 lb (76.2 kg)   SpO2 98%   BMI 28.84 kg/m  Wt Readings from Last 3 Encounters:  10/15/19 168 lb (76.2 kg)  07/30/19 165 lb (74.8 kg)  07/23/19 164 lb 6.4 oz (74.6 kg)     Health Maintenance Due  Topic Date Due  . Hepatitis C Screening  Never done  .  PNEUMOCOCCAL POLYSACCHARIDE VACCINE AGE 34-64 HIGH RISK  Never done  . TETANUS/TDAP  Never done  . OPHTHALMOLOGY EXAM  02/20/2013  . URINE MICROALBUMIN  05/21/2019    There are no preventive care reminders to display for this patient.  Lab  Results  Component Value Date   TSH 1.200 05/20/2018   Lab Results  Component Value Date   WBC 5.3 07/23/2019   HGB 15.8 07/23/2019   HCT 45.6 07/23/2019   MCV 89 07/23/2019   PLT 237 05/20/2018   Lab Results  Component Value Date   NA 142 07/23/2019   K 4.0 07/23/2019   CO2 24 07/23/2019   GLUCOSE 147 (H) 07/23/2019   BUN 13 07/23/2019   CREATININE 0.68 (L) 07/23/2019   BILITOT 0.7 07/23/2019   ALKPHOS 101 07/23/2019   AST 19 07/23/2019   ALT 30 07/23/2019   PROT 7.1 07/23/2019   ALBUMIN 4.5 07/23/2019   CALCIUM 9.5 07/23/2019   Lab Results  Component Value Date   CHOL 115 07/23/2019   Lab Results  Component Value Date   HDL 44 07/23/2019   Lab Results  Component Value Date   LDLCALC 54 07/23/2019   Lab Results  Component Value Date   TRIG 84 07/23/2019   Lab Results  Component Value Date   CHOLHDL 2.1 11/20/2018   Lab Results  Component Value Date   HGBA1C 8.6 (H) 07/23/2019      Assessment & Plan:  1. Type 2 diabetes mellitus without complication, without long-term current use of insulin (HCC) - POCT HgB A1C 8.2 - A1C is down slightly from 8.6 in April - Will increase glipizide to 10mg  once daily  - glipiZIDE (GLUCOTROL XL) 10 MG 24 hr tablet; Take 1 tablet (10 mg total) by mouth daily with breakfast.  Dispense: 30 tablet; Refill: 3 - HgB A1c; Future - Urine Microalbumin w/creat. ratio; Future - Advised to continue low carb, low concentrated sugar diet.  Meds ordered this encounter  Medications  . glipiZIDE (GLUCOTROL XL) 10 MG 24 hr tablet    Sig: Take 1 tablet (10 mg total) by mouth daily with breakfast.    Dispense:  30 tablet    Refill:  3    Follow-up:  Return in about 14 weeks (around  01/21/2020), or if symptoms worsen or fail to improve.  01/23/2020, [place]

## 2019-10-15 NOTE — Patient Instructions (Signed)
Recuento de carbohidratos para la diabetes mellitus en los adultos Carbohydrate Counting for Diabetes Mellitus, Adult  El recuento de carbohidratos es un mtodo para llevar un registro de la cantidad de carbohidratos que se ingieren. La ingesta natural de carbohidratos aumenta la cantidad de azcar (glucosa) en la sangre. El recuento de la cantidad de carbohidratos que se ingieren sirve para que el nivel de glucosa en sangre permanezca dentro de los lmites normales, lo que ayuda a mantener la diabetes (diabetes mellitus) bajo control. Es importante saber la cantidad de carbohidratos que se pueden ingerir en cada comida sin correr ningn riesgo. Esto es diferente en cada persona. Un especialista en alimentacin y nutricin (nutricionista certificado) puede ayudarlo a crear un plan de alimentacin y a calcular la cantidad de carbohidratos que debe ingerir en cada comida y colacin. Los siguientes alimentos incluyen carbohidratos:  Granos, como panes y cereales.  Frijoles secos y productos con soja.  Verduras con almidn, como papas, guisantes y maz.  Frutas y jugos de frutas.  Leche y yogur.  Dulces y colaciones, como pasteles, galletas, caramelos, papas fritas de bolsa y refrescos. Cmo se calculan los carbohidratos? Hay dos maneras de calcular los carbohidratos de los alimentos. Puede usar cualquiera de los dos mtodos o una combinacin de ambos. Leer la etiqueta de "informacin nutricional" de los alimentos envasados La lista de "informacin nutricional" est incluida en las etiquetas de casi todas las bebidas y los alimentos envasados de los [place]. Incluye lo siguiente:  El tamao de la porcin.  Informacin sobre los nutrientes de cada porcin, incluidos los gramos (g) de carbohidratos por porcin. Para usar la "informacin nutricional":  Decida cuntas porciones va a comer.  Multiplique la cantidad de porciones por el nmero de carbohidratos por porcin.  El resultado  es la cantidad total de carbohidratos que comer. Conocer los tamaos de las porciones estndar de otros alimentos Cuando coma alimentos que contengan carbohidratos y que no estn envasados o no incluyan la "informacin nutricional" en la etiqueta, debe medir las porciones para poder calcular la cantidad de carbohidratos:  Mida los alimentos que comer con una balanza de alimentos o una taza medidora, si es necesario.  Decida cuntas porciones de tamao estndar comer.  Multiplique el nmero de porciones por15. La mayora de los alimentos con alto contenido de carbohidratos contienen unos 15g de carbohidratos por porcin. ? Por ejemplo, si come 8onzas (170g) de fresas, habr comido 2porciones y 30g de carbohidratos (2porciones x 15g=30g).  En el caso de las comidas que contienen mezclas de ms de un alimento, como las sopas y los guisos, debe calcular los carbohidratos de cada alimento que se incluye. La siguiente lista contiene los tamaos de porciones estndar de los alimentos ricos en carbohidratos ms comunes. Cada una de estas porciones tiene aproximadamente 15g de carbohidratos:  pan de hamburguesa o muffin ingls.  onza (15ml) de jarabe.   onza (14g) de mermelada.  1rebanada de pan.  1tortilla de seis pulgadas.  3onzas (85g) de arroz o pasta cocidos.  4onzas (113g) de frijoles secos cocidos.  4onzas (113g) de verduras con almidn, como guisantes, maz o papas.  4onzas (113g) de cereal caliente.  4 onzas (113g) de pur de papas o de una papa grande al horno.  4onzas (113g) de frutas en lata o congeladas.  4onzas (120ml) de jugo de frutas.  4a 6galletas.  6croquetas de pollo.  6onzas (170g) de cereales secos sin azcar.  6onzas (170g) de yogur descremado sin ningn agregado o de yogur   endulzado con edulcorante artificial.  8onzas (240ml) de leche.  8 onzas (170g) de frutas frescas o una fruta pequea.  24 onzas  (680g) de palomitas de maz. Ejemplo de recuento de carbohidratos Ejemplo de comida  3 onzas (85g) de pechugas de pollo.  6onzas (170g) de arroz integral.  4onzas (113g) de maz.  8onzas (240ml) de leche.  8onzas (170g) de fresas con crema batida sin azcar. Clculo de carbohidratos 1. Identifique los alimentos que contienen carbohidratos: ? Arroz. ? Maz. ? Leche. ? Fresas. 2. Calcule cuntas porciones come de cada alimento: ? 2 porciones de arroz. ? 1 porcin de maz. ? 1 porcin de leche. ? 1 porcin de fresas. 3. Multiplique cada nmero de porciones por 15g: ? 2 porciones de arroz x 15 g = 30 g. ? 1 porcin de maz x 15 g = 15 g. ? 1 porcin de leche x 15 g = 15 g. ? 1 porcin de fresas x 15 g = 15 g. 4. Sume todas las cantidades para conocer el total de gramos de carbohidratos consumidos: ? 30g + 15g + 15g + 15g = 75g de carbohidratos en total. Resumen  El recuento de carbohidratos es un mtodo para llevar un registro de la cantidad de carbohidratos que se ingieren.  La ingesta natural de carbohidratos aumenta la cantidad de azcar (glucosa) en la sangre.  El recuento de la cantidad de carbohidratos que se ingieren sirve para mantener el nivel de glucosa en sangre dentro de los lmites normales, lo que ayuda a mantener la diabetes bajo control.  Un especialista en alimentacin y nutricin (nutricionista certificado) puede ayudarlo a crear un plan de alimentacin y a calcular la cantidad de carbohidratos que debe ingerir en cada comida y colacin. Esta informacin no tiene como fin reemplazar el consejo del mdico. Asegrese de hacerle al mdico cualquier pregunta que tenga. Document Revised: 01/15/2017 Document Reviewed: 09/07/2015 Elsevier Patient Education  2020 Elsevier Inc.  

## 2019-10-16 ENCOUNTER — Other Ambulatory Visit: Payer: Self-pay | Admitting: Family Medicine

## 2019-10-16 DIAGNOSIS — E119 Type 2 diabetes mellitus without complications: Secondary | ICD-10-CM

## 2019-10-16 DIAGNOSIS — I10 Essential (primary) hypertension: Secondary | ICD-10-CM

## 2019-10-16 DIAGNOSIS — E785 Hyperlipidemia, unspecified: Secondary | ICD-10-CM

## 2020-01-13 ENCOUNTER — Other Ambulatory Visit: Payer: Self-pay

## 2020-01-13 DIAGNOSIS — E119 Type 2 diabetes mellitus without complications: Secondary | ICD-10-CM

## 2020-01-14 ENCOUNTER — Telehealth: Payer: Self-pay | Admitting: Gerontology

## 2020-01-14 LAB — MICROALBUMIN / CREATININE URINE RATIO
Creatinine, Urine: 48.2 mg/dL
Microalb/Creat Ratio: <6 mg/g creat (ref 0–29)
Microalbumin, Urine: <3 ug/mL

## 2020-01-14 LAB — HEMOGLOBIN A1C
Est. average glucose Bld gHb Est-mCnc: 160 mg/dL
Hgb A1c MFr Bld: 7.2 % — ABNORMAL HIGH (ref 4.8–5.6)

## 2020-01-14 NOTE — Telephone Encounter (Signed)
Called Spanish interpreter line and interpreter called patient's phone number. Patient's wife answered and will give message to call the clinic.  Purpose of call was to administer the social determinants of health screening.

## 2020-01-21 ENCOUNTER — Ambulatory Visit: Payer: Self-pay | Admitting: Gerontology

## 2020-01-21 ENCOUNTER — Other Ambulatory Visit: Payer: Self-pay

## 2020-01-21 ENCOUNTER — Encounter: Payer: Self-pay | Admitting: Gerontology

## 2020-01-21 VITALS — BP 131/78 | HR 88 | Temp 98.0°F | Resp 17 | Ht 64.0 in | Wt 167.7 lb

## 2020-01-21 DIAGNOSIS — I10 Essential (primary) hypertension: Secondary | ICD-10-CM

## 2020-01-21 DIAGNOSIS — E785 Hyperlipidemia, unspecified: Secondary | ICD-10-CM

## 2020-01-21 DIAGNOSIS — S0993XA Unspecified injury of face, initial encounter: Secondary | ICD-10-CM

## 2020-01-21 DIAGNOSIS — E119 Type 2 diabetes mellitus without complications: Secondary | ICD-10-CM

## 2020-01-21 MED ORDER — CARVEDILOL 12.5 MG PO TABS: ORAL_TABLET | ORAL | 3 refills | Status: DC

## 2020-01-21 MED ORDER — ATORVASTATIN CALCIUM 40 MG PO TABS: 40.0000 mg | ORAL_TABLET | Freq: Every day | ORAL | 3 refills | Status: DC

## 2020-01-21 MED ORDER — METFORMIN HCL 1000 MG PO TABS: 1000.0000 mg | ORAL_TABLET | Freq: Two times a day (BID) | ORAL | 3 refills | Status: DC

## 2020-01-21 MED ORDER — GLIPIZIDE ER 10 MG PO TB24: 10.0000 mg | ORAL_TABLET | Freq: Every day | ORAL | 3 refills | Status: DC
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
## 2020-04-13 ENCOUNTER — Other Ambulatory Visit: Payer: Self-pay

## 2020-04-20 ENCOUNTER — Ambulatory Visit: Payer: Self-pay | Admitting: Gerontology

## 2020-05-05 ENCOUNTER — Ambulatory Visit: Payer: Self-pay | Admitting: Gerontology

## 2020-05-12 ENCOUNTER — Encounter: Payer: Self-pay | Admitting: Gerontology

## 2020-05-12 ENCOUNTER — Ambulatory Visit: Payer: Self-pay | Admitting: Gerontology

## 2020-05-12 ENCOUNTER — Other Ambulatory Visit: Payer: Self-pay

## 2020-05-12 VITALS — BP 118/74 | HR 80 | Temp 98.0°F | Wt 163.5 lb

## 2020-05-12 DIAGNOSIS — I10 Essential (primary) hypertension: Secondary | ICD-10-CM

## 2020-05-12 DIAGNOSIS — E785 Hyperlipidemia, unspecified: Secondary | ICD-10-CM

## 2020-05-12 DIAGNOSIS — E119 Type 2 diabetes mellitus without complications: Secondary | ICD-10-CM

## 2020-05-12 MED ORDER — CARVEDILOL 12.5 MG PO TABS
ORAL_TABLET | ORAL | 2 refills | Status: DC
Start: 2020-05-12 — End: 2020-09-08

## 2020-05-12 MED ORDER — SITAGLIPTIN PHOSPHATE 25 MG PO TABS: 25.0000 mg | ORAL_TABLET | Freq: Every day | ORAL | 0 refills | Status: DC

## 2020-05-12 MED ORDER — METFORMIN HCL 1000 MG PO TABS: 1000.0000 mg | ORAL_TABLET | Freq: Two times a day (BID) | ORAL | 2 refills | Status: DC

## 2020-05-12 MED ORDER — ATORVASTATIN CALCIUM 40 MG PO TABS: 40.0000 mg | ORAL_TABLET | Freq: Every day | ORAL | 2 refills | Status: DC

## 2020-05-12 MED ORDER — GLIPIZIDE ER 10 MG PO TB24: 10.0000 mg | ORAL_TABLET | Freq: Every day | ORAL | 2 refills | Status: DC

## 2020-05-12 NOTE — Patient Instructions (Signed)
https://www.diabeteseducator.org/docs/default-source/living-with-diabetes/conquering-the-grocery-store-v1.pdf?sfvrsn=4">  Carbohydrate Counting for Diabetes Mellitus, Adult Carbohydrate counting is a method of keeping track of how many carbohydrates you eat. Eating carbohydrates naturally increases the amount of sugar (glucose) in the blood. Counting how many carbohydrates you eat improves your blood glucose control, which helps you manage your diabetes. It is important to know how many carbohydrates you can safely have in each meal. This is different for every person. A dietitian can help you make a meal plan and calculate how many carbohydrates you should have at each meal and snack. What foods contain carbohydrates? Carbohydrates are found in the following foods:  Grains, such as breads and cereals.  Dried beans and soy products.  Starchy vegetables, such as potatoes, peas, and corn.  Fruit and fruit juices.  Milk and yogurt.  Sweets and snack foods, such as cake, cookies, candy, chips, and soft drinks.   How do I count carbohydrates in foods? There are two ways to count carbohydrates in food. You can read food labels or learn standard serving sizes of foods. You can use either of the methods or a combination of both. Using the Nutrition Facts label The Nutrition Facts list is included on the labels of almost all packaged foods and beverages in the [place] It includes:  The serving size.  Information about nutrients in each serving, including the grams (g) of carbohydrate per serving. To use the Nutrition Facts:  Decide how many servings you will have.  Multiply the number of servings by the number of carbohydrates per serving.  The resulting number is the total amount of carbohydrates that you will be having. Learning the standard serving sizes of foods When you eat carbohydrate foods that are not packaged or do not include Nutrition Facts on the label, you need to measure the  servings in order to count the amount of carbohydrates.  Measure the foods that you will eat with a food scale or measuring cup, if needed.  Decide how many standard-size servings you will eat.  Multiply the number of servings by 15. For foods that contain carbohydrates, one serving equals 15 g of carbohydrates. ? For example, if you eat 2 cups or 10 oz (300 g) of strawberries, you will have eaten 2 servings and 30 g of carbohydrates (2 servings x 15 g = 30 g).  For foods that have more than one food mixed, such as soups and casseroles, you must count the carbohydrates in each food that is included. The following list contains standard serving sizes of common carbohydrate-rich foods. Each of these servings has about 15 g of carbohydrates:  1 slice of bread.  1 six-inch (15 cm) tortilla.  ? cup or 2 oz (53 g) cooked rice or pasta.   cup or 3 oz (85 g) cooked or canned, drained and rinsed beans or lentils.   cup or 3 oz (85 g) starchy vegetable, such as peas, corn, or squash.   cup or 4 oz (120 g) hot cereal.   cup or 3 oz (85 g) boiled or mashed potatoes, or  or 3 oz (85 g) of a large baked potato.   cup or 4 fl oz (118 mL) fruit juice.  1 cup or 8 fl oz (237 mL) milk.  1 small or 4 oz (106 g) apple.   or 2 oz (63 g) of a medium banana.  1 cup or 5 oz (150 g) strawberries.  3 cups or 1 oz (24 g) popped popcorn. What is an example of   carbohydrate counting? To calculate the number of carbohydrates in this sample meal, follow the steps shown below. Sample meal  3 oz (85 g) chicken breast.  ? cup or 4 oz (106 g) brown rice.   cup or 3 oz (85 g) corn.  1 cup or 8 fl oz (237 mL) milk.  1 cup or 5 oz (150 g) strawberries with sugar-free whipped topping. Carbohydrate calculation 1. Identify the foods that contain carbohydrates: ? Rice. ? Corn. ? Milk. ? Strawberries. 2. Calculate how many servings you have of each food: ? 2 servings rice. ? 1 serving  corn. ? 1 serving milk. ? 1 serving strawberries. 3. Multiply each number of servings by 15 g: ? 2 servings rice x 15 g = 30 g. ? 1 serving corn x 15 g = 15 g. ? 1 serving milk x 15 g = 15 g. ? 1 serving strawberries x 15 g = 15 g. 4. Add together all of the amounts to find the total grams of carbohydrates eaten: ? 30 g + 15 g + 15 g + 15 g = 75 g of carbohydrates total. What are tips for following this plan? Shopping  Develop a meal plan and then make a shopping list.  Buy fresh and frozen vegetables, fresh and frozen fruit, dairy, eggs, beans, lentils, and whole grains.  Look at food labels. Choose foods that have more fiber and less sugar.  Avoid processed foods and foods with added sugars. Meal planning  Aim to have the same amount of carbohydrates at each meal and for each snack time.  Plan to have regular, balanced meals and snacks. Where to find more information  American Diabetes Association: www.diabetes.org  Centers for Disease Control and Prevention: www.cdc.gov Summary  Carbohydrate counting is a method of keeping track of how many carbohydrates you eat.  Eating carbohydrates naturally increases the amount of sugar (glucose) in the blood.  Counting how many carbohydrates you eat improves your blood glucose control, which helps you manage your diabetes.  A dietitian can help you make a meal plan and calculate how many carbohydrates you should have at each meal and snack. This information is not intended to replace advice given to you by your health care provider. Make sure you discuss any questions you have with your health care provider. Document Revised: 03/26/2019 Document Reviewed: 03/27/2019 Elsevier Patient Education  2021 Elsevier Inc. https://www.nhlbi.nih.gov/files/docs/public/heart/dash_brief.pdf">  DASH Eating Plan DASH stands for Dietary Approaches to Stop Hypertension. The DASH eating plan is a healthy eating plan that has been shown to:  Reduce  high blood pressure (hypertension).  Reduce your risk for type 2 diabetes, heart disease, and stroke.  Help with weight loss. What are tips for following this plan? Reading food labels  Check food labels for the amount of salt (sodium) per serving. Choose foods with less than 5 percent of the Daily Value of sodium. Generally, foods with less than 300 milligrams (mg) of sodium per serving fit into this eating plan.  To find whole grains, look for the word "whole" as the first word in the ingredient list. Shopping  Buy products labeled as "low-sodium" or "no salt added."  Buy fresh foods. Avoid canned foods and pre-made or frozen meals. Cooking  Avoid adding salt when cooking. Use salt-free seasonings or herbs instead of table salt or sea salt. Check with your health care provider or pharmacist before using salt substitutes.  Do not fry foods. Cook foods using healthy methods such as baking, boiling,   grilling, roasting, and broiling instead.  Cook with heart-healthy oils, such as olive, canola, avocado, soybean, or sunflower oil. Meal planning  Eat a balanced diet that includes: ? 4 or more servings of fruits and 4 or more servings of vegetables each day. Try to fill one-half of your plate with fruits and vegetables. ? 6-8 servings of whole grains each day. ? Less than 6 oz (170 g) of lean meat, poultry, or fish each day. A 3-oz (85-g) serving of meat is about the same size as a deck of cards. One egg equals 1 oz (28 g). ? 2-3 servings of low-fat dairy each day. One serving is 1 cup (237 mL). ? 1 serving of nuts, seeds, or beans 5 times each week. ? 2-3 servings of heart-healthy fats. Healthy fats called omega-3 fatty acids are found in foods such as walnuts, flaxseeds, fortified milks, and eggs. These fats are also found in cold-water fish, such as sardines, salmon, and mackerel.  Limit how much you eat of: ? Canned or prepackaged foods. ? Food that is high in trans fat, such as  some fried foods. ? Food that is high in saturated fat, such as fatty meat. ? Desserts and other sweets, sugary drinks, and other foods with added sugar. ? Full-fat dairy products.  Do not salt foods before eating.  Do not eat more than 4 egg yolks a week.  Try to eat at least 2 vegetarian meals a week.  Eat more home-cooked food and less restaurant, buffet, and fast food.   Lifestyle  When eating at a restaurant, ask that your food be prepared with less salt or no salt, if possible.  If you drink alcohol: ? Limit how much you use to:  0-1 drink a day for women who are not pregnant.  0-2 drinks a day for men. ? Be aware of how much alcohol is in your drink. In the [place] one drink equals one 12 oz bottle of beer (355 mL), one 5 oz glass of wine (148 mL), or one 1 oz glass of hard liquor (44 mL). General information  Avoid eating more than 2,300 mg of salt a day. If you have hypertension, you may need to reduce your sodium intake to 1,500 mg a day.  Work with your health care provider to maintain a healthy body weight or to lose weight. Ask what an ideal weight is for you.  Get at least 30 minutes of exercise that causes your heart to beat faster (aerobic exercise) most days of the week. Activities may include walking, swimming, or biking.  Work with your health care provider or dietitian to adjust your eating plan to your individual calorie needs. What foods should I eat? Fruits All fresh, dried, or frozen fruit. Canned fruit in natural juice (without added sugar). Vegetables Fresh or frozen vegetables (raw, steamed, roasted, or grilled). Low-sodium or reduced-sodium tomato and vegetable juice. Low-sodium or reduced-sodium tomato sauce and tomato paste. Low-sodium or reduced-sodium canned vegetables. Grains Whole-grain or whole-wheat bread. Whole-grain or whole-wheat pasta. Brown rice. Oatmeal. Quinoa. Bulgur. Whole-grain and low-sodium cereals. Pita bread. Low-fat, low-sodium  crackers. Whole-wheat flour tortillas. Meats and other proteins Skinless chicken or turkey. Ground chicken or turkey. Pork with fat trimmed off. Fish and seafood. Egg whites. Dried beans, peas, or lentils. Unsalted nuts, nut butters, and seeds. Unsalted canned beans. Lean cuts of beef with fat trimmed off. Low-sodium, lean precooked or cured meat, such as sausages or meat loaves. Dairy Low-fat (1%) or fat-free (skim)   milk. Reduced-fat, low-fat, or fat-free cheeses. Nonfat, low-sodium ricotta or cottage cheese. Low-fat or nonfat yogurt. Low-fat, low-sodium cheese. Fats and oils Soft margarine without trans fats. Vegetable oil. Reduced-fat, low-fat, or light mayonnaise and salad dressings (reduced-sodium). Canola, safflower, olive, avocado, soybean, and sunflower oils. Avocado. Seasonings and condiments Herbs. Spices. Seasoning mixes without salt. Other foods Unsalted popcorn and pretzels. Fat-free sweets. The items listed above may not be a complete list of foods and beverages you can eat. Contact a dietitian for more information. What foods should I avoid? Fruits Canned fruit in a light or heavy syrup. Fried fruit. Fruit in cream or butter sauce. Vegetables Creamed or fried vegetables. Vegetables in a cheese sauce. Regular canned vegetables (not low-sodium or reduced-sodium). Regular canned tomato sauce and paste (not low-sodium or reduced-sodium). Regular tomato and vegetable juice (not low-sodium or reduced-sodium). Pickles. Olives. Grains Baked goods made with fat, such as croissants, muffins, or some breads. Dry pasta or rice meal packs. Meats and other proteins Fatty cuts of meat. Ribs. Fried meat. Bacon. Bologna, salami, and other precooked or cured meats, such as sausages or meat loaves. Fat from the back of a pig (fatback). Bratwurst. Salted nuts and seeds. Canned beans with added salt. Canned or smoked fish. Whole eggs or egg yolks. Chicken or turkey with skin. Dairy Whole or 2% milk,  cream, and half-and-half. Whole or full-fat cream cheese. Whole-fat or sweetened yogurt. Full-fat cheese. Nondairy creamers. Whipped toppings. Processed cheese and cheese spreads. Fats and oils Butter. Stick margarine. Lard. Shortening. Ghee. Bacon fat. Tropical oils, such as coconut, palm kernel, or palm oil. Seasonings and condiments Onion salt, garlic salt, seasoned salt, table salt, and sea salt. Worcestershire sauce. Tartar sauce. Barbecue sauce. Teriyaki sauce. Soy sauce, including reduced-sodium. Steak sauce. Canned and packaged gravies. Fish sauce. Oyster sauce. Cocktail sauce. Store-bought horseradish. Ketchup. Mustard. Meat flavorings and tenderizers. Bouillon cubes. Hot sauces. Pre-made or packaged marinades. Pre-made or packaged taco seasonings. Relishes. Regular salad dressings. Other foods Salted popcorn and pretzels. The items listed above may not be a complete list of foods and beverages you should avoid. Contact a dietitian for more information. Where to find more information  National Heart, Lung, and Blood Institute: www.nhlbi.nih.gov  American Heart Association: www.heart.org  Academy of Nutrition and Dietetics: www.eatright.org  National Kidney Foundation: www.kidney.org Summary  The DASH eating plan is a healthy eating plan that has been shown to reduce high blood pressure (hypertension). It may also reduce your risk for type 2 diabetes, heart disease, and stroke.  When on the DASH eating plan, aim to eat more fresh fruits and vegetables, whole grains, lean proteins, low-fat dairy, and heart-healthy fats.  With the DASH eating plan, you should limit salt (sodium) intake to 2,300 mg a day. If you have hypertension, you may need to reduce your sodium intake to 1,500 mg a day.  Work with your health care provider or dietitian to adjust your eating plan to your individual calorie needs. This information is not intended to replace advice given to you by your health care  provider. Make sure you discuss any questions you have with your health care provider. Document Revised: 02/27/2019 Document Reviewed: 02/27/2019 Elsevier Patient Education  2021 Elsevier Inc.  

## 2020-05-12 NOTE — Progress Notes (Signed)
Established Patient Office Visit  Subjective:  Patient ID: Hector Weber, male    DOB: 1970-08-02  Age: 50 y.o. MRN: [id number]  CC: Patient presents for follow-up  HPI Hector Weber is a 50 year old male with PMH significant for diabetes, HTN, hyperlipidemia. He presents for follow up today. [name] compliant with his current medications. His last A1C in October was 7.2%. He rarely checks his blood sugar at home. He reports the last time he checked it was a few months ago. His FSBG today is 233 and point of care A1C 8.9%. He denies any light-headedness or dizziness, clamminess, or palpitations. He denies polydipsia, polyuria, or polyphagia. He reports examining his feet daily and does not go barefoot. He denies any peripheral numbness or tingling. His blood pressure is well-controlled today. He does not check it at home. He follows the DASH diet and stopped drinking alcohol months ago. Overall, he states he feels well and has no other complaints.   Past Medical History:  Diagnosis Date  . Anemia   . Arthritis   . Chronic kidney disease   . Diabetes ([place])   . Hyperlipidemia   . Hypertension   . Sleep apnea     No past surgical history on file.  Family History  Problem Relation Age of Onset  . Diabetes type II Mother   . Seizures Father     Social History   Socioeconomic History  . Marital status: Single    Spouse name: Not on file  . Number of children: 3  . Years of education: Not on file  . Highest education level: Not on file  Occupational History    Comment: restaurant  Tobacco Use  . Smoking status: Current Some Day Smoker    Packs/day: 0.25    Types: Cigarettes  . Smokeless tobacco: Former Systems developer    Types: Snuff, Chew  Vaping Use  . Vaping Use: Never used  Substance and Sexual Activity  . Alcohol use: Not Currently    Comment: states that he hasnt drank alcohol since July 2021  . Drug use: Not Currently  . Sexual activity: Yes    Birth  control/protection: Coitus interruptus  Other Topics Concern  . Not on file  Social History Narrative   Pays lots in child support, which takes a lot of his money. Unsure if he has applied for food stamps before.   Social Determinants of Health   Financial Resource Strain: Not on file  Food Insecurity: Not on file  Transportation Needs: Not on file  Physical Activity: Not on file  Stress: Not on file  Social Connections: Not on file  Intimate Partner Violence: Not on file    Outpatient Medications Prior to Visit  Medication Sig Dispense Refill  . atorvastatin (LIPITOR) 40 MG tablet Take 1 tablet (40 mg total) by mouth daily. 30 tablet 3  . carvedilol (COREG) 12.5 MG tablet TAKE 1 TABLET BY MOUTH TWICE A DAY WITH A MEAL 60 tablet 3  . glipiZIDE (GLUCOTROL XL) 10 MG 24 hr tablet Take 1 tablet (10 mg total) by mouth daily with breakfast. 30 tablet 3  . metFORMIN (GLUCOPHAGE) 1000 MG tablet Take 1 tablet (1,000 mg total) by mouth 2 (two) times daily with a meal. Reported on 08/30/2015 60 tablet 3   No facility-administered medications prior to visit.    No Known Allergies  ROS Review of Systems  Constitutional: Negative.   HENT: Negative.   Eyes: Negative.  Respiratory: Negative.   Cardiovascular: Negative.   Gastrointestinal: Negative.   Endocrine: Negative.   Genitourinary: Negative.   Musculoskeletal: Negative.   Skin: Negative.   Neurological: Negative.   Psychiatric/Behavioral: Negative.       Objective:    Physical Exam Constitutional:      Appearance: Normal appearance.  Cardiovascular:     Rate and Rhythm: Normal rate and regular rhythm.     Pulses: Normal pulses.     Heart sounds: Normal heart sounds.  Pulmonary:     Effort: Pulmonary effort is normal.     Breath sounds: Normal breath sounds.  Abdominal:     General: Bowel sounds are normal.     Palpations: Abdomen is soft.  Musculoskeletal:        General: Normal range of motion.  Feet:     Right  foot:     Skin integrity: Dry skin present. No ulcer or skin breakdown.     Toenail Condition: Right toenails are abnormally thick.     Left foot:     Skin integrity: Dry skin present. No ulcer or skin breakdown.  Skin:    General: Skin is warm.     Capillary Refill: Capillary refill takes less than 2 seconds.  Neurological:     Mental Status: [name] alert and oriented to person, place, and time.  Psychiatric:        Mood and Affect: Mood normal.     There were no vitals taken for this visit. Wt Readings from Last 3 Encounters:  01/21/20 167 lb 11.2 oz (76.1 kg)  10/15/19 168 lb (76.2 kg)  07/30/19 165 lb (74.8 kg)     Health Maintenance Due  Topic Date Due  . Hepatitis C Screening  Never done  . PNEUMOCOCCAL POLYSACCHARIDE VACCINE AGE 45-64 HIGH RISK  Never done  . TETANUS/TDAP  Never done  . OPHTHALMOLOGY EXAM  02/20/2013  . COLONOSCOPY (Pts 45-61yr Insurance coverage will need to be confirmed)  Never done  . INFLUENZA VACCINE  11/08/2019  . COVID-19 Vaccine (3 - Booster for Pfizer series) 02/04/2020    There are no preventive care reminders to display for this patient.  Lab Results  Component Value Date   TSH 1.200 05/20/2018   Lab Results  Component Value Date   WBC 5.3 07/23/2019   HGB 15.8 07/23/2019   HCT 45.6 07/23/2019   MCV 89 07/23/2019   PLT 237 05/20/2018   Lab Results  Component Value Date   NA 142 07/23/2019   K 4.0 07/23/2019   CO2 24 07/23/2019   GLUCOSE 147 (H) 07/23/2019   BUN 13 07/23/2019   CREATININE 0.68 (L) 07/23/2019   BILITOT 0.7 07/23/2019   ALKPHOS 101 07/23/2019   AST 19 07/23/2019   ALT 30 07/23/2019   PROT 7.1 07/23/2019   ALBUMIN 4.5 07/23/2019   CALCIUM 9.5 07/23/2019   Lab Results  Component Value Date   CHOL 115 07/23/2019   Lab Results  Component Value Date   HDL 44 07/23/2019   Lab Results  Component Value Date   LDLCALC 54 07/23/2019   Lab Results  Component Value Date   TRIG 84 07/23/2019   Lab  Results  Component Value Date   CHOLHDL 2.1 11/20/2018   Lab Results  Component Value Date   HGBA1C 7.2 (H) 01/13/2020      Assessment & Plan:   1. Essential hypertension BP well-controlled today.  Daily checks at home.  Educated on DBelle [place] No  changes to current medication regimen. - Comp Met (CMET); Future - Lipid panel; Future - CBC w/Diff; Future - carvedilol (COREG) 12.5 MG tablet; TAKE 1 TABLET BY MOUTH TWICE A DAY WITH A MEAL  Dispense: 60 tablet; Refill: 2  2. Diabetes mellitus without complication (HCC) BS and A1C elevated today.  Directed pt to check BS at home BID and keep a log; bring to next visit.  Discussed carb count and avoiding greasy, fried foods. Discussed side effects of Januvia with patient.  Return in one month for follow up with A1C to follow in 3 months. Continue self foot exams - POCT HgB A1C 8.9% - Ambulatory referral to Ophthalmology; Future - HgB A1c; Future - glipiZIDE (GLUCOTROL XL) 10 MG 24 hr tablet; Take 1 tablet (10 mg total) by mouth daily with breakfast.  Dispense: 30 tablet; Refill: 2 - metFORMIN (GLUCOPHAGE) 1000 MG tablet; Take 1 tablet (1,000 mg total) by mouth 2 (two) times daily with a meal. Reported on 08/30/2015  Dispense: 60 tablet; Refill: 2 - sitaGLIPtin (JANUVIA) 25 MG tablet; Take 1 tablet (25 mg total) by mouth daily.  Dispense: 30 tablet; Refill: 0  3. Hyperlipidemia LDL goal <70 Continue current medication regimen.  Avoid fried, greasy foods. Discussed healthy fats and foods associated.  Will recheck lipid panel in 3 months. - atorvastatin (LIPITOR) 40 MG tablet; Take 1 tablet (40 mg total) by mouth daily.  Dispense: 30 tablet; Refill: 2    Follow-up: Return in 1 month (06/09/20), or if symptoms worsen or fail to improve.     Clayton Bibles, RN

## 2020-05-14 ENCOUNTER — Other Ambulatory Visit: Payer: Self-pay | Admitting: Gerontology

## 2020-05-14 DIAGNOSIS — E119 Type 2 diabetes mellitus without complications: Secondary | ICD-10-CM

## 2020-06-07 ENCOUNTER — Ambulatory Visit: Payer: Self-pay | Admitting: Specialist

## 2020-06-09 ENCOUNTER — Ambulatory Visit: Payer: Self-pay | Admitting: Gerontology

## 2020-06-09 ENCOUNTER — Other Ambulatory Visit: Payer: Self-pay

## 2020-06-09 VITALS — BP 115/75 | HR 77 | Temp 97.5°F | Resp 16 | Wt 161.9 lb

## 2020-06-09 DIAGNOSIS — E119 Type 2 diabetes mellitus without complications: Secondary | ICD-10-CM

## 2020-06-09 LAB — POCT GLYCOSYLATED HEMOGLOBIN (HGB A1C)
HbA1c POC (<> result, manual entry): 7.9 % (ref 4.0–5.6)
HbA1c, POC (controlled diabetic range): 0 % (ref 0.0–7.0)
HbA1c, POC (prediabetic range): 0 % — AB (ref 5.7–6.4)
Hemoglobin A1C: 7.9 % — AB (ref 4.0–5.6)

## 2020-06-09 LAB — GLUCOSE, POCT (MANUAL RESULT ENTRY): POC Glucose: 166 mg/dl — AB (ref 70–99)

## 2020-06-09 NOTE — Patient Instructions (Signed)
https://www.diabeteseducator.org/docs/default-source/living-with-diabetes/conquering-the-grocery-store-v1.pdf?[email or web address]">  Recuento de carbohidratos para la diabetes mellitus en los adultos Carbohydrate Counting for Diabetes Mellitus, Adult El recuento de carbohidratos es un mtodo para llevar un registro de la cantidad de carbohidratos que se ingieren. La ingesta natural de carbohidratos aumenta la cantidad de azcar (glucosa) en la sangre. El recuento de la cantidad de carbohidratos que se ingieren mejora el control del nivel de glucemia, lo que ayuda a manejar la diabetes. Es importante saber la cantidad de carbohidratos que se pueden ingerir en cada comida sin correr ningn riesgo. Esto es diferente en cada persona. Un nutricionista puede ayudarlo a crear un plan de alimentacin y a calcular la cantidad de carbohidratos que debe ingerir en cada comida y colacin. Qu alimentos contienen carbohidratos? Los siguientes alimentos incluyen carbohidratos:  Granos, como panes y cereales.  Frijoles secos y productos con soja.  Verduras con almidn, como papas, guisantes y maz.  Frutas y jugos de frutas.  Leche y yogur.  Dulces y colaciones, como pasteles, galletas, caramelos, papas fritas de bolsa y refrescos.   Cmo se calculan los carbohidratos de los alimentos? Hay dos maneras de calcular los carbohidratos de los alimentos. Puede leer las etiquetas de los alimentos o aprender cules son los tamaos de las porciones estndar de los alimentos. Puede usar cualquiera de los dos mtodos o una combinacin de ambos. Usar la etiqueta de informacin nutricional La lista de informacin nutricional est incluida en las etiquetas de casi todas las bebidas y los alimentos envasados de los [place]. Incluye lo siguiente:  El tamao de la porcin.  Informacin sobre los nutrientes de cada porcin, incluidos los gramos (g) de carbohidratos por porcin. Para usar la informacin  nutricional:  Decida cuntas porciones va a comer.  Multiplique la cantidad de porciones por el nmero de carbohidratos por porcin.  El resultado es la cantidad total de carbohidratos que comer. Conocer los tamaos de las porciones estndar de los alimentos Cuando coma alimentos que contengan carbohidratos y que no estn envasados o no incluyan la informacin nutricional en la etiqueta, debe medir las porciones para poder calcular la cantidad de carbohidratos.  Mida los alimentos que comer con una balanza de alimentos o una taza medidora, si es necesario.  Decida cuntas porciones de tamao estndar comer.  Multiplique el nmero de porciones por15. En los alimentos que contienen carbohidratos, una porcin equivale a 15 g de carbohidratos. ? Por ejemplo, si come 2 tazas o 10onzas (300g) de fresas, habr comido 2porciones y 30g de carbohidratos (2porciones x 15g=30g).  En el caso de las comidas que contienen mezclas de ms de un alimento, como las sopas y los guisos, debe calcular los carbohidratos de cada alimento que se incluye. La siguiente lista contiene los tamaos de porciones estndar de los alimentos ricos en carbohidratos ms comunes. Cada una de estas porciones tiene aproximadamente 15g de carbohidratos:  1rebanada de pan.  1tortilla de seis pulgadas (15cm).  ? de taza o 2onzas (53g) de arroz o pasta cocidos.   taza o 3 onzas (85 g) de lentejas o frijoles cocidos o enlatados, escurridos y enjuagados.   taza o 3onzas (85g) de verduras con almidn, como guisantes, maz o zapallo.   taza o 4 onzas (120 g) de cereal caliente.   taza o 3 onzas (85 g) de papas hervidas o en pur, o  o 3 onzas (85 g) de una papa grande al horno.   taza o 4 onzas fluidas (118ml) de jugo de frutas.  1 taza u 8   onzas fluidas (237 ml) de leche.  1 unidad pequea o 4 onzas (106 g) de manzana.   unidad o 2 onzas (63 g) de una banana mediana.  1 taza o 5 oz (150 g) de  fresas.  3 tazas o 1 oz (24g) de palomitas de maz. Cul sera un ejemplo de recuento de carbohidratos? Para calcular el nmero de carbohidratos de este ejemplo de comida, siga los pasos que se describen a continuacin. Ejemplo de comida  3 onzas (85g) de pechugas de pollo.  ? de taza o 4 onzas (106 g) de arroz integral.   taza o 3 onzas (85 g) de maz.  1 taza u 8 onzas fluidas (237 ml) de leche.  1 taza o 5onzas (150g) de fresas con crema batida sin azcar. Clculo de carbohidratos 1. Identifique los alimentos que contienen carbohidratos: ? Arroz. ? Maz. ? Leche. ? Fresas. 2. Calcule cuntas porciones come de cada alimento: ? 2 porciones de arroz. ? 1 porcin de maz. ? 1 porcin de leche. ? 1 porcin de fresas. 3. Multiplique cada nmero de porciones por 15g: ? 2 porciones de arroz x 15 g = 30 g. ? 1 porcin de maz x 15 g = 15 g. ? 1 porcin de leche x 15 g = 15 g. ? 1 porcin de fresas x 15 g = 15 g. 4. Sume todas las cantidades para conocer el total de gramos de carbohidratos consumidos: ? 30g + 15g + 15g + 15g = 75g de carbohidratos en total. Consejos para seguir este plan Al ir de compras  Elabore un plan de comidas y luego haga una lista de compras.  Compre verduras frescas y congeladas, frutas frescas y congeladas, productos lcteos, huevos, frijoles, lentejas y cereales integrales.  Fjese en las etiquetas de los alimentos. Elija los alimentos que tengan ms fibra y menos azcar.  Evite los alimentos procesados y los alimentos con azcares agregados. Planificacin de las comidas  Trate de consumir la misma cantidad de carbohidratos en cada comida y en cada colacin.  Planifique tomar comidas y colaciones regulares y equilibradas. Dnde buscar ms informacin  American Diabetes Association (Asociacin [place] de la Diabetes): www.diabetes.org  Centers for Disease Control and Prevention (Centros para el Control y la Prevencin de  Enfermedades): www.cdc.gov Resumen  El recuento de carbohidratos es un mtodo para llevar un registro de la cantidad de carbohidratos que se ingieren.  La ingesta natural de carbohidratos aumenta la cantidad de azcar (glucosa) en la sangre.  El recuento de la cantidad de carbohidratos que se ingieren mejora el control del nivel de glucemia, lo que ayuda a manejar la diabetes.  Un nutricionista puede ayudarlo a crear un plan de alimentacin y a calcular la cantidad de carbohidratos que debe ingerir en cada comida y colacin. Esta informacin no tiene como fin reemplazar el consejo del mdico. Asegrese de hacerle al mdico cualquier pregunta que tenga. Document Revised: 05/04/2019 Document Reviewed: 05/04/2019 Elsevier Patient Education  2021 Elsevier Inc.  

## 2020-06-09 NOTE — Progress Notes (Signed)
Established Patient Office Visit  Subjective:  Patient ID: Hector Weber, male    DOB: [birth year]  Age: 50 y.o. MRN: [id number]  CC: No chief complaint on file.   HPI Hector Weber presents for is a 50 year old male with PMH significant for diabetes, HTN, hyperlipidemia. He presents for routine follow up today. His HgbA1c done during visit was 7.9% and was started on Januvia 25 mg daily during his last visit. Currently, he states that he has not picked up Januvia from Pharmacy and has not been checking his blood glucose, he denies hypoglycemia/hyperglycemic symptoms. His blood glucose checked during visit was 166 mg/dl. Overall, he states that he's doing well and offers no further complaint.   Past Medical History:  Diagnosis Date  . Anemia   . Arthritis   . Chronic kidney disease   . Diabetes (HCC)   . Hyperlipidemia   . Hypertension   . Sleep apnea     No past surgical history on file.  Family History  Problem Relation Age of Onset  . Diabetes type II Mother   . Seizures Father     Social History   Socioeconomic History  . Marital status: Single    Spouse name: Not on file  . Number of children: 3  . Years of education: Not on file  . Highest education level: Not on file  Occupational History    Comment: restaurant  Tobacco Use  . Smoking status: Current Some Day Smoker    Packs/day: 0.25    Types: Cigarettes  . Smokeless tobacco: Former Neurosurgeon    Types: Snuff, Chew  Vaping Use  . Vaping Use: Never used  Substance and Sexual Activity  . Alcohol use: Not Currently    Comment: states that he hasnt drank alcohol since July 2021  . Drug use: Not Currently  . Sexual activity: Yes    Birth control/protection: Coitus interruptus  Other Topics Concern  . Not on file  Social History Narrative   Pays lots in child support, which takes a lot of his money. Unsure if he has applied for food stamps before.   Social Determinants of Health   Financial  Resource Strain: Not on file  Food Insecurity: Not on file  Transportation Needs: Not on file  Physical Activity: Not on file  Stress: Not on file  Social Connections: Not on file  Intimate Partner Violence: Not on file    Outpatient Medications Prior to Visit  Medication Sig Dispense Refill  . atorvastatin (LIPITOR) 40 MG tablet Take 1 tablet (40 mg total) by mouth daily. 30 tablet 2  . carvedilol (COREG) 12.5 MG tablet TAKE 1 TABLET BY MOUTH TWICE A DAY WITH A MEAL 60 tablet 2  . glipiZIDE (GLUCOTROL XL) 10 MG 24 hr tablet Take 1 tablet (10 mg total) by mouth daily with breakfast. 30 tablet 2  . metFORMIN (GLUCOPHAGE) 1000 MG tablet Take 1 tablet (1,000 mg total) by mouth 2 (two) times daily with a meal. Reported on 08/30/2015 60 tablet 2  . sitaGLIPtin (JANUVIA) 25 MG tablet Take 1 tablet (25 mg total) by mouth daily. 30 tablet 0   No facility-administered medications prior to visit.    No Known Allergies  ROS Review of Systems  Constitutional: Negative.   Respiratory: Negative.   Cardiovascular: Negative.   Endocrine: Negative.   Skin: Negative.   Neurological: Negative.   Psychiatric/Behavioral: Negative.       Objective:    Physical  Exam HENT:     Head: Normocephalic and atraumatic.  Eyes:     Extraocular Movements: Extraocular movements intact.     Conjunctiva/sclera: Conjunctivae normal.     Pupils: Pupils are equal, round, and reactive to light.  Cardiovascular:     Rate and Rhythm: Normal rate and regular rhythm.     Pulses: Normal pulses.     Heart sounds: Normal heart sounds.  Pulmonary:     Effort: Pulmonary effort is normal.     Breath sounds: Normal breath sounds.  Skin:    General: Skin is warm.  Neurological:     General: No focal deficit present.     Mental Status: [name] alert and oriented to person, place, and time. Mental status is at baseline.  Psychiatric:        Mood and Affect: Mood normal.        Behavior: Behavior normal.         Thought Content: Thought content normal.        Judgment: Judgment normal.     BP 115/75 (BP Location: Left Arm, Patient Position: Sitting, Cuff Size: Large)   Pulse 77   Temp (!) 97.5 F (36.4 C)   Resp 16   Wt 161 lb 14.4 oz (73.4 kg)   SpO2 98%   BMI 27.79 kg/m  Wt Readings from Last 3 Encounters:  06/09/20 161 lb 14.4 oz (73.4 kg)  05/12/20 163 lb 8 oz (74.2 kg)  01/21/20 167 lb 11.2 oz (76.1 kg)     Health Maintenance Due  Topic Date Due  . Hepatitis C Screening  Never done  . PNEUMOCOCCAL POLYSACCHARIDE VACCINE AGE 32-64 HIGH RISK  Never done  . TETANUS/TDAP  Never done  . OPHTHALMOLOGY EXAM  02/20/2013  . COLONOSCOPY (Pts 45-49yrs Insurance coverage will need to be confirmed)  Never done  . INFLUENZA VACCINE  11/08/2019  . COVID-19 Vaccine (3 - Booster for Pfizer series) 02/04/2020    There are no preventive care reminders to display for this patient.  Lab Results  Component Value Date   TSH 1.200 05/20/2018   Lab Results  Component Value Date   WBC 5.3 07/23/2019   HGB 15.8 07/23/2019   HCT 45.6 07/23/2019   MCV 89 07/23/2019   PLT 237 05/20/2018   Lab Results  Component Value Date   NA 142 07/23/2019   K 4.0 07/23/2019   CO2 24 07/23/2019   GLUCOSE 147 (H) 07/23/2019   BUN 13 07/23/2019   CREATININE 0.68 (L) 07/23/2019   BILITOT 0.7 07/23/2019   ALKPHOS 101 07/23/2019   AST 19 07/23/2019   ALT 30 07/23/2019   PROT 7.1 07/23/2019   ALBUMIN 4.5 07/23/2019   CALCIUM 9.5 07/23/2019   Lab Results  Component Value Date   CHOL 115 07/23/2019   Lab Results  Component Value Date   HDL 44 07/23/2019   Lab Results  Component Value Date   LDLCALC 54 07/23/2019   Lab Results  Component Value Date   TRIG 84 07/23/2019   Lab Results  Component Value Date   CHOLHDL 2.1 11/20/2018   Lab Results  Component Value Date   HGBA1C 7.9 (A) 06/09/2020   HGBA1C 7.9 06/09/2020   HGBA1C 0 (A) 06/09/2020   HGBA1C 0.0 06/09/2020      Assessment &  Plan:     1. Type 2 diabetes mellitus without complication, without long-term current use of insulin (HCC) - His HgbA1c was 7.9% and his goal should  be less than 7%.  He was encouraged to pick up Januvia , continue on Metformin and Glipizide, check his fasting blood glucose at least daily, record and bring log to follow up appointment. His fasting reading should be between 80-130 mg/dl. He was encouraged to continue on low carb/non concentrated sweet diet. - POCT HgB A1C; Future - POCT Glucose (CBG); Future - Will follow up with [facility] Ophthalmologist Dr Dava Najjar. Ambulatory referral to Ophthalmology - POCT HgB A1C - POCT Glucose (CBG)   Follow-up: Return in about 13 weeks (around 09/08/2020).    Eliane Hammersmith Trellis Paganini, NP

## 2020-06-14 ENCOUNTER — Telehealth: Payer: Self-pay

## 2020-06-14 NOTE — Telephone Encounter (Signed)
Unable to LVM to schedule eye appt. Have given him eye appt for 4/1 at 9:00am.

## 2020-06-14 NOTE — Telephone Encounter (Signed)
ERROR

## 2020-06-21 ENCOUNTER — Ambulatory Visit: Payer: Self-pay

## 2020-07-26 ENCOUNTER — Other Ambulatory Visit: Payer: Self-pay

## 2020-07-26 ENCOUNTER — Ambulatory Visit: Payer: Self-pay | Admitting: Endocrinology

## 2020-07-26 VITALS — Wt 162.4 lb

## 2020-07-26 DIAGNOSIS — E119 Type 2 diabetes mellitus without complications: Secondary | ICD-10-CM

## 2020-07-26 DIAGNOSIS — I10 Essential (primary) hypertension: Secondary | ICD-10-CM

## 2020-07-26 NOTE — Progress Notes (Addendum)
Follow up Diabetes/ Endocrine [facility]     Patient ID: Hector Weber, male   DOB: 13-Mar-1971, 50 y.o.   MRN: [id number] Assessment:  Hector Weber is a 50 y.o. male who is seen in follow up for Diabetes mellitus without complication ([place]) [D62.2] at the request of Hector Weber, Chioma E, NP.  Encounter Diagnoses 1. Diabetes mellitus without complication ([place])   2. Essential hypertension     Assessment  Patient is a 50 year old male with a history of T2DM, HTN, and hyperlipidemia who presents for routine DM care follow-up. Patient is currently not at goal but has been making progress (A1c of 7.9). He has a preference to maintain current regimen as opposed to introducing a new therapy- especially one that might be costly.  We discussed that with improvements in lifestyle, we might be able to accomplish our goals and thus won't introduce new [place] today but consider at next visit.   Plan:  - Maintain current DM treatment regimen (metformin 1,000 mg BID and glipizide 10 mg daily).  Patient was advised to hold off filling the sitagliptin prescription as he continue making lifestyle changes to manage his DM. - Perform routine DM labs: BMP, lipid profile panel, microalbumin, MAC, CBC. - Follow up in 3 months for routine DM care. - Refer to Ophthalmology for routine diabetic retinopathy screening.    Patient Instructions  Hector Weber to see you.  The A1C is a bit higher at 7.9% . Please continue the metformin and the glipizide . We discussed the other medication Hector Weber) and for now agree it is ok to not begin this and work on lifestyle a bit longer.  Continue to avoid concentrated sweets and stay active and hydrated and get plenty of protein in the diet.  Let's recheck after you work on these items at the next follow up in 3 months.      Orders Placed This Encounter  Procedures   Basic metabolic panel   CBC w/Diff   Lipid panel   Microalbumin / creatinine urine ratio     Subjective:   Patient is a 49 year old male with a 14-year history of T2DM, and of HTN and hyperlipidemia who presents for routine DM care follow-up. He reports that he has had ups and downs with his DM management. He irregularly checks his BG because of a busy work schedule. He reports fasting BG levels of 35-50 (60 at times); he denies any associated dizziness. He manages his DM with metformin 1,000 mg BID and glipizide 10 mg tab in the morning. He reports not having picked up the prescribed sitagliptin 25 mg tab because of concerns about its cost. He denies any side effects, missed doses, or issues affording his current medications.   Patient denies symptoms of hyperglycemia, hypoglycemia, and peripheral neuropathy. He reports examining his feet regularly and denies any bruises or concerns. He states that his last foot exam was 3 months ago, and his last eye exam over 2 years ago. He reports some discomfort with bright light (TV and phone). He reports of family history of DM in his mom, sister, aunt, and grandfather. He denies any history of thyroid issue.   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 He reports drinking beer, little water, 2 cups of coffee (with little sugar) daily, and no soda. His diet varies; he reports consuming some vegetables and home-cooked meals, and occasional "big meals" that lead to "sugar spikes." He states that he understands the  importance of eating healthy and "doing the right things" to care for his health.    Review of Systems  Constitutional: Negative for unexpected weight change.  Eyes: Positive for photophobia.    Hector Weber  has a past medical history of Anemia, Arthritis, Chronic kidney disease, Diabetes ([place]), Hyperlipidemia, Hypertension, and Sleep apnea.  Family History, Social History, current Medications and allergies reviewed and updated in Epic.  Objective:    Weight 162 lb 6.4 oz (73.7 kg). Physical Exam Constitutional:       Appearance: Normal appearance. [name] normal weight.  Cardiovascular:     Rate and Rhythm: Normal rate and regular rhythm.  Pulmonary:     Effort: Pulmonary effort is normal.     Breath sounds: Normal breath sounds.  Neurological:     Mental Status: [name] alert.         Data : I have personally reviewed pertinent labs and imaging studies, if indicated,  with the patient in clinic today.    Lab Orders     Basic metabolic panel     CBC w/Diff     Lipid panel     Microalbumin / creatinine urine ratio  HC Readings from Last 3 Encounters:  No data found for [facility]    Wt Readings from Last 3 Encounters:  07/26/20 162 lb 6.4 oz (73.7 kg)  06/09/20 161 lb 14.4 oz (73.4 kg)  05/12/20 163 lb 8 oz (74.2 kg)

## 2020-07-26 NOTE — Patient Instructions (Signed)
Great to see you.  The A1C is a bit higher at 7.9% . Please continue the metformin and the glipizide . We discussed the other medication Alma Friendly) and for now agree it is ok to not begin this and work on lifestyle a bit longer.  Continue to avoid concentrated sweets and stay active and hydrated and get plenty of protein in the diet.  Let's recheck after you work on these items at the next follow up in 3 months.

## 2020-08-31 ENCOUNTER — Other Ambulatory Visit: Payer: Self-pay

## 2020-08-31 DIAGNOSIS — E119 Type 2 diabetes mellitus without complications: Secondary | ICD-10-CM

## 2020-08-31 DIAGNOSIS — I10 Essential (primary) hypertension: Secondary | ICD-10-CM

## 2020-08-31 NOTE — Progress Notes (Signed)
microcbc

## 2020-09-01 LAB — COMPREHENSIVE METABOLIC PANEL
ALT: 33 IU/L (ref 0–44)
AST: 19 IU/L (ref 0–40)
Albumin/Globulin Ratio: 1.8 (ref 1.2–2.2)
Albumin: 4.6 g/dL (ref 4.0–5.0)
Alkaline Phosphatase: 83 IU/L (ref 44–121)
BUN/Creatinine Ratio: 24 — ABNORMAL HIGH (ref 9–20)
BUN: 16 mg/dL (ref 6–24)
Bilirubin Total: 0.5 mg/dL (ref 0.0–1.2)
CO2: 22 mmol/L (ref 20–29)
Calcium: 8.8 mg/dL (ref 8.7–10.2)
Chloride: 104 mmol/L (ref 96–106)
Creatinine, Ser: 0.66 mg/dL — ABNORMAL LOW (ref 0.76–1.27)
Globulin, Total: 2.5 g/dL (ref 1.5–4.5)
Glucose: 201 mg/dL — ABNORMAL HIGH (ref 65–99)
Potassium: 4.5 mmol/L (ref 3.5–5.2)
Sodium: 141 mmol/L (ref 134–144)
Total Protein: 7.1 g/dL (ref 6.0–8.5)
eGFR: 116 mL/min/{1.73_m2} (ref >59)

## 2020-09-01 LAB — HEMOGLOBIN A1C
Est. average glucose Bld gHb Est-mCnc: 174 mg/dL
Hgb A1c MFr Bld: 7.7 % — ABNORMAL HIGH (ref 4.8–5.6)

## 2020-09-01 LAB — CBC WITH DIFFERENTIAL/PLATELET
Basophils Absolute: 0 10*3/uL (ref 0.0–0.2)
Basos: 0 %
EOS (ABSOLUTE): 0.1 10*3/uL (ref 0.0–0.4)
Eos: 2 %
Hematocrit: 46 % (ref 37.5–51.0)
Hemoglobin: 15.2 g/dL (ref 13.0–17.7)
Immature Grans (Abs): 0 10*3/uL (ref 0.0–0.1)
Immature Granulocytes: 0 %
Lymphocytes Absolute: 1.6 10*3/uL (ref 0.7–3.1)
Lymphs: 31 %
MCH: 29.7 pg (ref 26.6–33.0)
MCHC: 33 g/dL (ref 31.5–35.7)
MCV: 90 fL (ref 79–97)
Monocytes Absolute: 0.5 10*3/uL (ref 0.1–0.9)
Monocytes: 10 %
Neutrophils Absolute: 3 10*3/uL (ref 1.4–7.0)
Neutrophils: 57 %
Platelets: 237 10*3/uL (ref 150–450)
RBC: 5.11 x10E6/uL (ref 4.14–5.80)
RDW: 11.9 % (ref 11.6–15.4)
WBC: 5.3 10*3/uL (ref 3.4–10.8)

## 2020-09-01 LAB — LIPID PANEL
Chol/HDL Ratio: 2.8 ratio (ref 0.0–5.0)
Cholesterol, Total: 130 mg/dL (ref 100–199)
HDL: 46 mg/dL (ref >39)
LDL Chol Calc (NIH): 70 mg/dL (ref 0–99)
Triglycerides: 66 mg/dL (ref 0–149)
VLDL Cholesterol Cal: 14 mg/dL (ref 5–40)

## 2020-09-01 LAB — MICROALBUMIN / CREATININE URINE RATIO
Creatinine, Urine: 63.4 mg/dL
Microalb/Creat Ratio: <5 mg/g creat (ref 0–29)
Microalbumin, Urine: <3 ug/mL

## 2020-09-08 ENCOUNTER — Other Ambulatory Visit: Payer: Self-pay

## 2020-09-08 ENCOUNTER — Encounter: Payer: Self-pay | Admitting: Gerontology

## 2020-09-08 ENCOUNTER — Ambulatory Visit: Payer: Self-pay | Admitting: Gerontology

## 2020-09-08 VITALS — BP 127/79 | HR 85 | Temp 97.9°F | Resp 16 | Ht 64.0 in | Wt 164.6 lb

## 2020-09-08 DIAGNOSIS — I10 Essential (primary) hypertension: Secondary | ICD-10-CM

## 2020-09-08 DIAGNOSIS — E785 Hyperlipidemia, unspecified: Secondary | ICD-10-CM

## 2020-09-08 DIAGNOSIS — E119 Type 2 diabetes mellitus without complications: Secondary | ICD-10-CM

## 2020-09-08 LAB — GLUCOSE, POCT (MANUAL RESULT ENTRY): POC Glucose: 197 mg/dl — AB (ref 70–99)

## 2020-09-08 MED ORDER — GLIPIZIDE ER 10 MG PO TB24: 10.0000 mg | ORAL_TABLET | Freq: Every day | ORAL | 2 refills | Status: DC

## 2020-09-08 MED ORDER — CARVEDILOL 12.5 MG PO TABS: ORAL_TABLET | ORAL | 2 refills | Status: DC

## 2020-09-08 MED ORDER — METFORMIN HCL 1000 MG PO TABS: 1000.0000 mg | ORAL_TABLET | Freq: Two times a day (BID) | ORAL | 2 refills | Status: DC

## 2020-09-08 MED ORDER — ATORVASTATIN CALCIUM 40 MG PO TABS: 40.0000 mg | ORAL_TABLET | Freq: Every day | ORAL | 2 refills | Status: DC

## 2020-09-08 NOTE — Progress Notes (Signed)
Established Patient Office Visit  Subjective:  Patient ID: Hector Weber, male    DOB: 16-Nov-1970  Age: 50 y.o. MRN: [id number]  CC:  Chief Complaint  Patient presents with  . Follow-up    Labs 08/31/20  . Diabetes  . Hypertension    HPI Hector Weber  Is a 50 y/o male who has history of Anemia, Arthritis, CKD, T2DM, Hypertension, Hyperlipidemia, presents for routine follow up visit, lab review and medication refill. His HgbA1c done on 08/31/20 decreased from 7.9% to 7.7%. He states that he's compliant with his medication and adheres to ADA diet. He checks his blood glucose 2-3 times a week, but states that he will do better. He denies hypoglycemic/hyperglycemic symptoms, peripheral neuropathy and performs daily foot checks. His blood glucose was 197 mg/dl when checked during visit. He defers Colonoscopy screening and will follow up with [facility] for eye exam. Overall, he states that he's doing well and offers no further complaint.  Past Medical History:  Diagnosis Date  . Anemia   . Arthritis   . Chronic kidney disease   . Diabetes (Hector Weber)   . Hyperlipidemia   . Hypertension   . Sleep apnea     Past Surgical History:  Procedure Laterality Date  . NO PAST SURGERIES      Family History  Problem Relation Age of Onset  . Diabetes type II Mother   . Seizures Father   . Diabetes Sister     Social History   Socioeconomic History  . Marital status: Single    Spouse name: Not on file  . Number of children: 3  . Years of education: Not on file  . Highest education level: Not on file  Occupational History    Comment: restaurant  Tobacco Use  . Smoking status: Current Some Day Smoker    Types: Cigarettes, Cigars  . Smokeless tobacco: Former Systems developer    Types: Snuff, Chew    Quit date: 09/08/2000  . Tobacco comment: 1 cigarette or cigar 2-3 times per week  Vaping Use  . Vaping Use: Never used  Substance and Sexual Activity  . Alcohol use: Yes    Comment:  occassional  . Drug use: Never  . Sexual activity: Yes    Birth control/protection: Coitus interruptus  Other Topics Concern  . Not on file  Social History Narrative   Pays lots in child support, which takes a lot of his money. Unsure if he has applied for food stamps before.   Social Determinants of Health   Financial Resource Strain: Not on file  Food Insecurity: No Food Insecurity  . Worried About Charity fundraiser in the Last Year: Never true  . Ran Out of Food in the Last Year: Never true  Transportation Needs: No Transportation Needs  . Lack of Transportation (Medical): No  . Lack of Transportation (Non-Medical): No  Physical Activity: Not on file  Stress: Not on file  Social Connections: Not on file  Intimate Partner Violence: Not on file    Outpatient Medications Prior to Visit  Medication Sig Dispense Refill  . atorvastatin (LIPITOR) 40 MG tablet Take 1 tablet (40 mg total) by mouth daily. 30 tablet 2  . carvedilol (COREG) 12.5 MG tablet TAKE 1 TABLET BY MOUTH TWICE A DAY WITH A MEAL 60 tablet 2  . glipiZIDE (GLUCOTROL XL) 10 MG 24 hr tablet Take 1 tablet (10 mg total) by mouth daily with breakfast. 30 tablet 2  .  metFORMIN (GLUCOPHAGE) 1000 MG tablet Take 1 tablet (1,000 mg total) by mouth 2 (two) times daily with a meal. Reported on 08/30/2015 60 tablet 2  . sitaGLIPtin (JANUVIA) 25 MG tablet Take 1 tablet (25 mg total) by mouth daily. (Patient not taking: No sig reported) 30 tablet 0   No facility-administered medications prior to visit.    No Known Allergies  ROS Review of Systems  Constitutional: Negative.   Eyes: Negative.   Respiratory: Negative.   Cardiovascular: Negative.   Genitourinary: Negative.   Skin: Negative.   Neurological: Negative.   Psychiatric/Behavioral: Negative.       Objective:    Physical Exam HENT:     Head: Normocephalic and atraumatic.     Mouth/Throat:     Mouth: Mucous membranes are moist.  Eyes:     Extraocular  Movements: Extraocular movements intact.     Conjunctiva/sclera: Conjunctivae normal.     Pupils: Pupils are equal, round, and reactive to light.  Cardiovascular:     Rate and Rhythm: Normal rate and regular rhythm.     Pulses: Normal pulses.     Heart sounds: Normal heart sounds.  Pulmonary:     Effort: Pulmonary effort is normal.     Breath sounds: Normal breath sounds.  Skin:    General: Skin is warm.  Neurological:     General: No focal deficit present.     Mental Status: [name] alert and oriented to person, place, and time. Mental status is at baseline.  Psychiatric:        Mood and Affect: Mood normal.        Behavior: Behavior normal.        Thought Content: Thought content normal.        Judgment: Judgment normal.     BP 127/79 (BP Location: Right Arm, Patient Position: Sitting, Cuff Size: Normal)   Pulse 85   Temp 97.9 F (36.6 C)   Resp 16   Ht '5\' 4"'  (1.626 m)   Wt 164 lb 9.6 oz (74.7 kg)   SpO2 97%   BMI 28.25 kg/m  Wt Readings from Last 3 Encounters:  09/08/20 164 lb 9.6 oz (74.7 kg)  08/31/20 165 lb 11.2 oz (75.2 kg)  07/26/20 162 lb 6.4 oz (73.7 kg)     Health Maintenance Due  Topic Date Due  . PNEUMOCOCCAL POLYSACCHARIDE VACCINE AGE 65-64 HIGH RISK  Never done  . Hepatitis C Screening  Never done  . TETANUS/TDAP  Never done  . OPHTHALMOLOGY EXAM  02/20/2013  . COVID-19 Vaccine (3 - Booster for Pfizer series) 01/05/2020    There are no preventive care reminders to display for this patient.  Lab Results  Component Value Date   TSH 1.200 05/20/2018   Lab Results  Component Value Date   WBC 5.3 08/31/2020   HGB 15.2 08/31/2020   HCT 46.0 08/31/2020   MCV 90 08/31/2020   PLT 237 08/31/2020   Lab Results  Component Value Date   NA 141 08/31/2020   K 4.5 08/31/2020   CO2 22 08/31/2020   GLUCOSE 201 (H) 08/31/2020   BUN 16 08/31/2020   CREATININE 0.66 (L) 08/31/2020   BILITOT 0.5 08/31/2020   ALKPHOS 83 08/31/2020   AST 19 08/31/2020    ALT 33 08/31/2020   PROT 7.1 08/31/2020   ALBUMIN 4.6 08/31/2020   CALCIUM 8.8 08/31/2020   EGFR 116 08/31/2020   Lab Results  Component Value Date   CHOL 130 08/31/2020  Lab Results  Component Value Date   HDL 46 08/31/2020   Lab Results  Component Value Date   LDLCALC 70 08/31/2020   Lab Results  Component Value Date   TRIG 66 08/31/2020   Lab Results  Component Value Date   CHOLHDL 2.8 08/31/2020   Lab Results  Component Value Date   HGBA1C 7.7 (H) 08/31/2020      Assessment & Plan:   1. Diabetes mellitus without complication ([place]) - His HgbA1c was 7.7%, his goal should be less than 7%. He was encouraged to check at least his fasting blood glucose daily and his goal should be between 80-130 mg/dl. He will continue on current medication, low carb/non concentrated sweet diet and exercise as tolerated. - glipiZIDE (GLUCOTROL XL) 10 MG 24 hr tablet; Take 1 tablet (10 mg total) by mouth daily with breakfast.  Dispense: 30 tablet; Refill: 2 - metFORMIN (GLUCOPHAGE) 1000 MG tablet; Take 1 tablet (1,000 mg total) by mouth 2 (two) times daily with a meal. Reported on 08/30/2015  Dispense: 60 tablet; Refill: 2 - HgB A1c; Future - POCT Glucose (CBG); Future - POCT Glucose (CBG) -He will follow up with [facility] Op[facility] Dr Waldemar Dickens  2. Hyperlipidemia LDL goal <70 -His Lipid panel are within normal limits, he will continue on current medication, low fat/cholesterol diet and exercise as tolerated. - atorvastatin (LIPITOR) 40 MG tablet; Take 1 tablet (40 mg total) by mouth daily.  Dispense: 30 tablet; Refill: 2  3. Essential hypertension -His blood pressure is under control, he will continue on current medication , DASH diet and exercise as tolerated. - carvedilol (COREG) 12.5 MG tablet; TAKE 1 TABLET BY MOUTH TWICE A DAY WITH A MEAL  Dispense: 60 tablet; Refill: 2     Follow-up: Return in about 3 months (around 12/07/2020), or if symptoms worsen or fail to improve.     Yareth Macdonnell Jerold Coombe, NP

## 2020-09-08 NOTE — Patient Instructions (Signed)
Plan de alimentacin cardiosaludable Heart-Healthy Eating Plan Muchos factores influyen en la salud del corazn (coronaria), entre ellos, los hbitos de alimentacin y de ejercicio fsico. El riesgo coronario aumenta cuando hay niveles anormales de grasa (lpidos) en la [place]. La planificacin de las comidas cardiosaludables implica limitar las grasas poco saludables, aumentar las grasas saludables y Radio producer otros cambios en la dieta y el estilo de [place]. En qu consiste el plan? El mdico podra recomendarle que haga lo siguiente:  Limitar la ingesta de grasas al _________% o menos del total de caloras por da.  Limitar la ingesta de grasas saturadas al _________% o menos del total de caloras por da.  Limitar la cantidad de colesterol en su dieta a menos de _________mg Karie Chimera. Cules son algunos consejos para seguir este plan? Al cocinar Evite frer los alimentos a la hora de la coccin. Hornear, hervir, grillar y asar a la parrilla son buenas opciones. Otras formas de reducir el consumo de grasas son las siguientes:  Quite la piel de las aves.  Quite todas las grasas visibles de las carnes.  Cocine al vapor las verduras en agua o caldo. Planificacin de las comidas  En las comidas, imagine dividir su plato en cuartos: ? Llene la mitad del plato con verduras y ensaladas de hojas verdes. ? Llene un cuarto del plato con cereales integrales. ? Llene un cuarto del plato con alimentos con protenas magras.  Coma 4 o 5porciones de verduras Air cabin crew. Una porcin equivale a una taza de verduras crudas o cocidas o a 2tazas de verduras de hojas verdes crudas.  Coma 4 o 5porciones de frutas por da. Una porcin equivale a una fruta mediana entera, taza de fruta desecada; taza de frutas frescas, congeladas o enlatadas; o taza de jugo 100% de fruta.  Consuma ms alimentos con fibra soluble. Entre ellos, se incluyen las [place], el [place], las [place], los frijoles, los  guisantes y Aeronautical engineer. Trate de consumir de 25a 30g de [employer].  Aumente el consumo de legumbres, frutos secos y semillas a 4 o 5porciones por semana. Una porcin de frijoles o legumbres secos equivale a taza despus de su coccin, una porcin de frutos secos equivale a  de taza y [place] porcin de semillas equivale a 1cucharada.   Grasas  Elija grasas saludables con mayor frecuencia. Elija las grasas monoinsaturadas y [address], como el aceite de oliva y el de canola, las semillas de [place], las nueces, las almendras y las semillas.  Consuma ms grasas omega-3. Elija salmn, caballa, sardinas, atn, aceite de lino y semillas de lino molidas. Propngase comer pescado al [employer] veces por semana.  Lea las etiquetas de los alimentos detenidamente para identificar los que contienen grasas trans o altas cantidades de grasas saturadas.  Limite el consumo de grasas saturadas. Estas se encuentran en productos de origen animal, como carnes, mantequilla y crema. Las grasas saturadas de origen vegetal incluyen aceite de palma, de palmiste y de coco.  Evite los alimentos con aceites parcialmente hidrogenados. Estos contienen grasas trans. [address] [place], se incluyen margarinas en barra, algunas margarinas untables, galletas dulces y [place] y otros productos horneados.  Evite las comidas fritas. Informacin general  Consuma ms comida casera y menos de restaurante, de bares y comida rpida.  Limite o evite el alcohol.  Limite los alimentos con alto contenido de almidn y International aid/development worker.  Baje de peso si es necesario. Perder solo del 5 al 10% de su peso corporal puede ayudarlo a Scientist, clinical (histocompatibility and immunogenetics)  su estado de salud general y a Education officer, museum, como la diabetes y las enfermedades cardacas.  Controle la ingesta de sal (sodio), especialmente si tiene presin arterial alta. Hable con el mdico acerca de cambiar la ingesta de sodio.  Intente incorporar ms comidas vegetarianas cada semana. Qu  alimentos puedo comer? Nils Pyle Nils Pyle frescas, en conserva (en su jugo natural) o frutas congeladas. Verduras Verduras frescas o congeladas (crudas, al vapor, asadas o grilladas). Ensaladas de hojas verdes. Cereales La mayora de los cereales. Elija casi siempre trigo integral y Radiation protection practitioner. Arroz y pastas, incluido el arroz integral y las pastas elaboradas con trigo integral. Armed forces operational officer y otras protenas Carnes magras de res, ternera, cerdo y cordero a las que se les haya quitado la grasa visible. Pollo y pavo sin piel. Todos los pescados y [employer]. Pato salvaje, conejo, faisn y venado. Claras de huevo o sustitutos del huevo bajos en colesterol. Porotos, guisantes y lentejas secos y tofu. Semillas y la mayora de los frutos secos. Lcteos Quesos descremados y semidescremados, entre ellos, ricota y Garment/textile technologist. Leche descremada o al 1% (lquida, en polvo o evaporada). Suero de [employer] elaborado con [employer]. Yogur descremado o semidescremado. Grasas y Writer no hidrogenadas (sin grasas trans). Aceites vegetales, incluido el de soja, ssamo, girasol, [place], man, crtamo, maz, canola y semillas de algodn. Alios para ensalada o mayonesa elaborados con aceite vegetal. Halina Andreas (mineral o con gas). T y caf. Gaseosas dietticas. Dulces y [employer], gelatina y helado de frutas. Pequeas cantidades de chocolate amargo. Limite todos los dulces y postres. Alios y [employer] y condimentos. Es posible que los productos que se enumeran ms [place] no constituyan una lista completa de los alimentos y las bebidas que puede tomar. Consulte a un nutricionista para conocer ms opciones. Qu alimentos no se recomiendan? Nils Pyle Fruta enlatada en almbar espeso. Frutas con salsa de crema o mantequilla. Frutas cocidas en aceite. Limite el consumo de coco. Verduras Verduras cocinadas con salsas de queso, crema o mantequilla. Verduras  fritas. Cereales Panes elaborados con grasas saturadas o trans, aceites o [employer]ak. Croissants. Panecillos dulces. Rosquillas. Galletas con alto contenido de grasas, como las que contienen [place]. Carnes y [address] protenas [address] grasas, como perros calientes, [employer] de res, salchichas, tocino, asado de Producer, television/film/video o [place]. Fiambres con alto contenido de [place], como salame y Loss adjuster, chartered. Caviar. Pato y ganso domsticos. Vsceras, como hgado. Lcteos Crema, crema agria, queso crema y [place] cottage con crema. Quesos enteros. Leche entera o al 2% (lquida, evaporada o condensada). Suero de [employer]. Salsa de crema o queso con alto contenido de [place]. Yogur de East[employer] Kodak. Grasas y [place] de carne o materia grasa. Manteca de cacao, aceites hidrogenados, aceite de palma, aceite de coco, aceite de palmiste. Grasas y [address] grasas slidas, incluida la grasa del tocino, el cerdo [place], la [place] de cerdo y Civil engineer, contracting. Sustitutos no lcteos de la crema. Aderezos para ensalada con queso o crema agria. Bebidas Refrescos regulares y cualquier bebida con agregado de azcar. Dulces y [employer]. Pudin. Galletas dulces. Bizcochuelos. Pasteles. Chocolate con leche o chocolate blanco. Almbares con mantequilla. Helados o bebidas elaboradas con helado con alto contenido de grasas. Es posible que los productos que se enumeran ms arriba no sean una lista completa de los alimentos y las bebidas que se Theatre stage manager. Consulte a un nutricionista para obtener ms informacin. Resumen  La planificacin de las comidas cardiosaludables implica limitar las grasas poco saludables, aumentar las grasas saludables y  hacer otros cambios en la dieta y el estilo de vida.  Baje de peso si es necesario. Perder solo del 5 al 10% de su peso corporal puede ayudarlo a mejorar su estado de salud general y a Education officer, museum, como la diabetes y las enfermedades cardacas.  Propngase seguir una  dieta equilibrada, que incluya frutas y verduras, productos lcteos descremados o semidescremados, protenas magras, frutos secos y legumbres, cereales integrales y aceites y grasas cardiosaludables. Esta informacin no tiene Theme park manager el consejo del mdico. Asegrese de hacerle al mdico cualquier pregunta que tenga. Document Revised: 07/06/2017 Document Reviewed: 07/06/2017 Elsevier Patient Education  2021 Elsevier Inc. [email or web address]://[email or web address]">  Plan de alimentacin DASH DASH Eating Plan DASH es la sigla en ingls de "Enfoques Alimentarios para Detener la Hipertensin". El plan de alimentacin DASH ha demostrado:  Bajar la presin arterial elevada (hipertensin).  Reducir el riesgo de diabetes tipo 2, enfermedad cardaca y accidente cerebrovascular.  Ayudar a perder peso. Consejos para seguir Consulting civil engineer las etiquetas de los alimentos  Verifique la cantidad de sal (sodio) por porcin en las etiquetas de los alimentos. Elija alimentos con menos del 5 por ciento del valor diario de sodio. Generalmente, los alimentos con menos de 300 miligramos (mg) de sodio por porcin se encuadran dentro de este plan alimentario.  Para encontrar cereales integrales, busque la palabra "integral" como primera palabra en la lista de ingredientes. Al ir de compras  Compre productos en los que en su etiqueta diga: "bajo contenido de sodio" o "sin agregado de sal".  Compre alimentos frescos. Evite los alimentos enlatados y comidas precocidas o congeladas. Al cocinar  Evite agregar sal cuando cocine. Use hierbas o aderezos sin sal, en lugar de sal de mesa o sal marina. Consulte al mdico o farmacutico antes de usar sustitutos de la sal.  No fra los alimentos. A la hora de cocinar los alimentos opte por hornearlos, hervirlos, grillarlos, asarlos al horno y asarlos a Patent attorney.  Cocine con aceites cardiosaludables, como oliva, canola, aguacate, soja o  girasol. Planificacin de las comidas  Consuma una dieta equilibrada, que incluya lo siguiente: ? 4o ms porciones de frutas y 4 o ms porciones de Warehouse manager. Trate de que medio plato de cada comida sea de frutas y verduras. ? De 6 a 8porciones de cereales [employer]. ? Menos de 6 onzas (170g) de carne, aves o pescado Copy. Una porcin de 3 onzas (85g) de carne tiene casi el mismo tamao que un mazo de cartas. Un huevo equivale a 1 onza (28g). ? De 2 a 3 porciones de productos lcteos descremados por da. Una porcin es 1taza ( ). ? 1 porcin de frutos secos, semillas o frijoles 5 veces por semana. ? De 2 a 3 porciones de grasas cardiosaludables. Las grasas saludables llamadas cidos grasos omega-3 se encuentran en alimentos como las nueces, las semillas de [place], las leches fortificadas y [place]. Estas grasas tambin se encuentran en los pescados de agua fra, como la sardina, el salmn y la caballa.  Limite la cantidad que consume de: ? Alimentos enlatados o envasados. ? Alimentos con alto contenido de grasa trans, como algunos alimentos fritos. ? Alimentos con alto contenido de grasa saturada, como carne con grasa. ? Postres y otros dulces, bebidas azucaradas y otros alimentos con azcar agregada. ? Productos lcteos enteros.  No le agregue sal a los alimentos antes de probarlos.  No coma ms de 4 yemas de huevo por semana.  Trate  de comer al menos 2 comidas vegetarianas por semana.  Consuma ms comida casera y menos de restaurante, de bares y comida rpida.   Estilo de vida  Cuando coma en un restaurante, pida que preparen su comida con menos sal o, en lo posible, sin nada de sal.  Si bebe alcohol: ? Limite la cantidad que bebe:  De 0 a 1 medida por da para las mujeres que no estn embarazadas.  De 0 a 2 medidas por da para los hombres. ? Est atento a la cantidad de alcohol que hay en las bebidas que toma. En los [place],  una medida equivale a una botella de cerveza de 12oz ( ), un vaso de vino de 5oz ( ) o un vaso de una bebida alcohlica de alta graduacin de 1oz (32ml). Informacin general  Evite ingerir ms de 2300 mg de sal por da. Si tiene hipertensin, es posible que necesite reducir la ingesta de sodio a 1,500 mg por da.  Trabaje con su mdico para mantener un peso saludable o perder [employer]. Pregntele cul es el peso recomendado para usted.  Realice al menos 30 minutos de ejercicio que haga que se acelere su corazn (ejercicio Magazine features editor) la [employer] de la [place]. Estas actividades pueden incluir caminar, nadar o andar en bicicleta.  Trabaje con su mdico o nutricionista para ajustar su plan alimentario a sus necesidades calricas personales. Qu alimentos debo comer? Frutas Todas las frutas frescas, congeladas o disecadas. Frutas enlatadas en jugo natural (sin agregado de azcar). Verduras Verduras frescas o congeladas (crudas, al vapor, asadas o grilladas). Jugos de tomate y verduras con bajo contenido de sodio o reducidos en sodio. Salsa y pasta de tomate con bajo contenido de sodio o reducidas en sodio. Verduras enlatadas con bajo contenido de sodio o reducidas en sodio. Granos Pan de salvado o integral. Pasta de salvado o integral. Arroz integral. Avena. Quinua. Trigo burgol. Cereales integrales y con bajo contenido de [place]. Pan pita. Galletitas de [place] con bajo contenido de [place] y [place]. Tortillas de [place] integral. Carnes y otras protenas Pollo o pavo sin piel. Carne de pollo o de [place]. Cerdo desgrasado. Pescado y [employer]. Claras de huevo. Porotos, guisantes o lentejas secos. Frutos secos, mantequilla de frutos secos y semillas sin sal. Frijoles enlatados sin sal. Cortes de carne vacuna magra, desgrasada. Carne precocida o curada magra y baja en sodio, como embutidos o panes de carne. Lcteos Leche descremada (1%) o descremada. Quesos reducidos en grasa, con  bajo contenido de grasa o descremados. Queso blanco o ricota sin grasa, con bajo contenido de [place]. Yogur semidescremado o descremado. Queso con bajo contenido de [place] y [place]. Grasas y [employer] untables que no contengan grasas trans. Aceite vegetal. Jerolyn Shin y aderezos para ensaladas livianos, reducidos en grasa o con bajo contenido de grasas (reducidos en sodio). Aceite de canola, crtamo, oliva, aguacate, soja y [place]. Aguacate. Alios y condimentos Hierbas. Especias. Mezclas de condimentos sin sal. Otros alimentos Palomitas de maz y pretzels sin sal. Dulces con bajo contenido de grasas. Es posible que los productos que se enumeran ms [place] no constituyan una lista completa de los alimentos y las bebidas que puede tomar. Consulte a un nutricionista para obtener ms informacin. Qu alimentos debo evitar? Nils Pyle Fruta enlatada en almbar liviano o espeso. Frutas cocidas en aceite. Frutas con salsa de crema o mantequilla. Verduras Verduras con crema o fritas. Verduras en salsa de [place]. Verduras enlatadas regulares (que no sean con bajo contenido de [place] o  reducidas en sodio). Pasta y salsa de tomates enlatadas regulares (que no sean con bajo contenido de sodio o reducidas en sodio). Jugos de tomate y verduras regulares (que no sean con bajo contenido de sodio o reducidos en sodio). Pepinillos. Aceitunas. Granos Productos de panificacin hechos con grasa, como medialunas, magdalenas y algunos panes. Comidas con arroz o pasta seca listas para usar. Carnes y [address]ras protenas Cortes de carne con alto contenido de Holiday representativegrasa. Costillas. Carne frita. [place]. [place], salame y otras carnes precocidas o curadas, como embutidos o panes de carne. Grasa de la espalda del cerdo (panceta). Salchicha de cerdo. Frutos secos y semillas con sal. Frijoles enlatados con agregado de sal. Pescado enlatado o ahumado. Huevos enteros o yemas. Pollo o pavo con piel. Lcteos Leche entera o al 2%, crema y  [address] leche y mitad crema. Queso crema entero o con toda su grasa. Yogur entero o endulzado. Quesos con toda su grasa. Sustitutos de cremas no lcteas. Coberturas batidas. Quesos para untar y quesos procesados. Grasas y Barnes & Nobleaceites Mantequilla. Margarina en barra. Manteca de cerdo. Lardo. Mantequilla clarificada. Grasa de panceta. Aceites tropicales como aceite de coco, palmiste o palma. Alios y condimentos Sal de cebolla, sal de ajo, sal condimentada, sal de mesa y sal marina. Salsa [place]. Salsa trtara. Salsa barbacoa. Salsa teriyaki. Salsa de soja, incluso la que tiene contenido reducido de [place]sodio. Salsa de carne. Salsas en lata y envasadas. Salsa de pescado. Salsa de Logan Creekostras. Salsa rosada. Rbanos picantes comprados en tiendas. [place]. Most[place]. Saborizantes y tiernizantes para carne. Caldo en cubitos. Salsas picantes. Adobos preelaborados o envasados. Aderezos para tacos preelaborados o envasados. Salsas de pepinillos. Aderezos comunes para ensalada. Otros alimentos Palomitas de maz y pretzels con sal. Es posible que los productos que se enumeran ms arriba no constituyan una lista completa de los alimentos y las bebidas que Personnel officerdebe evitar. Consulte a un nutricionista para obtener ms informacin. Dnde buscar ms informacin  National Heart, Lung, and Blood Institute (Instituto Nacional del Woosterorazn, los Pulmones y Risk managerla Sangre): PopSteam.iswww.nhlbi.nih.gov  American Heart Association (Asociacin Estadounidense del Corazn): www.heart.org  Academy of Nutrition and Dietetics (Academia de Nutricin y Pension scheme managerDiettica): www.eatright.org  National Kidney Foundation (Fundacin Nacional del Rin): www.kidney.org Resumen  El plan de alimentacin DASH ha demostrado bajar la presin arterial elevada (hipertensin). Tambin puede reducir Lexmark Internationalel riesgo de diabetes tipo 2, enfermedad cardaca y accidente cerebrovascular.  Cuando siga el plan de alimentacin DASH, trate de comer ms frutas frescas y verduras, cereales  integrales, carnes magras, lcteos descremados y grasas cardiosaludables.  Con el plan de alimentacin DASH, deber limitar el consumo de sal (sodio) a 2,300 mg por da. Si tiene hipertensin, es posible que necesite reducir la ingesta de sodio a 1,500 mg por da.  Trabaje con su mdico o nutricionista para ajustar su plan alimentario a sus necesidades calricas personales. Esta informacin no tiene Theme park managercomo fin reemplazar el consejo del mdico. Asegrese de hacerle al mdico cualquier pregunta que tenga. Document Revised: 04/30/2019 Document Reviewed: 04/30/2019 Elsevier Patient Education  2021 Elsevier Inc. https://www.diabeteseducator.org/docs/default-source/living-with-diabetes/conquering-the-grocery-store-v1.pdf?sfvrsn=4">  Recuento de carbohidratos para la diabetes mellitus en los adultos Carbohydrate Counting for Diabetes Mellitus, Adult El recuento de carbohidratos es un mtodo para llevar un registro de la cantidad de carbohidratos que se ingieren. La ingesta natural de carbohidratos aumenta la cantidad de azcar (glucosa) en la sangre. El recuento de la cantidad de carbohidratos que se ingieren mejora el control del nivel de Walterboroglucemia, lo que ayuda a Company secretarymanejar la diabetes. Es importante saber la cantidad de carbohidratos que  se pueden ingerir en cada comida sin correr ningn riesgo. Esto es Government social research officer. Un nutricionista puede ayudarlo a crear un plan de alimentacin y a calcular la cantidad de carbohidratos que debe ingerir en cada comida y colacin. Qu alimentos contienen carbohidratos? Los siguientes alimentos incluyen carbohidratos:  Granos, como panes y cereales.  Frijoles secos y productos con soja.  Verduras con almidn, como papas, guisantes y maz.  Nils Pyle y jugos de frutas.  Leche y Dentist.  Dulces y colaciones, como pasteles, galletas, caramelos, papas fritas de bolsa y refrescos.   Cmo se calculan los carbohidratos de los alimentos? Hay dos maneras de  calcular los carbohidratos de los alimentos. Puede leer las etiquetas de los alimentos o aprender cules son los tamaos de las porciones estndar de los alimentos. Puede usar cualquiera de [address] o [place] combinacin de [place]. Usar la Air cabin crew de informacin nutricional La lista de informacin nutricional est incluida en las etiquetas de casi todas las bebidas y los alimentos envasados de los [place]. Incluye lo siguiente:  El tamao de la porcin.  Informacin sobre los nutrientes de cada porcin, incluidos los gramos (g) de carbohidratos por porcin. Para usar la informacin nutricional:  Decida cuntas porciones va a comer.  Multiplique la cantidad de porciones por el nmero de carbohidratos por porcin.  El resultado es la cantidad total de carbohidratos que comer. Conocer los tamaos de las porciones estndar de los alimentos Cuando coma alimentos que contengan carbohidratos y que no estn envasados o no incluyan la informacin nutricional en la etiqueta, debe medir las porciones para poder calcular la cantidad de carbohidratos.  Mida los alimentos que comer con una balanza de alimentos o una taza medidora, si es necesario.  Decida cuntas porciones de Programmer, systems.  Multiplique el nmero de porciones por15. En los alimentos que contienen carbohidratos, una porcin [place] a 15 g de carbohidratos. ? Por ejemplo, si come 2 tazas o 10onzas (300g) de fresas, habr comido 2porciones y 30g de carbohidratos (2porciones x 15g=30g).  En el caso de las comidas que contienen mezclas de ms de un alimento, como las sopas y los guisos, debe calcular los carbohidratos de cada alimento que se incluye. La siguiente lista contiene los tamaos de porciones estndar de los alimentos ricos en carbohidratos ms comunes. Cada una de estas porciones tiene aproximadamente 15g de carbohidratos:  1rebanada de pan.  1tortilla de seis pulgadas (15cm).  ? de taza o  2onzas (53g) de arroz o pasta cocidos.   taza o 3 onzas (85 g) de lentejas o frijoles cocidos o enlatados, escurridos y enjuagados.   taza o 3onzas (85g) de verduras con almidn, como guisantes, maz o zapallo.   taza o 4 onzas (120 g) de cereal caliente.   taza o 3 onzas (85 g) de papas hervidas o en pur, o  o 3 onzas (85 g) de una papa grande al horno.   taza o 4 onzas fluidas ( ) de jugo de frutas.  1 taza u 8 onzas fluidas (237 ml) de leche.  1 unidad pequea o 4 onzas (106 g) de manzana.   unidad o 2 onzas (63 g) de una banana mediana.  1 taza o 5 oz (150 g) de fresas.  3 tazas o 1 oz (24g) de palomitas de maz. Cul sera un ejemplo de recuento de carbohidratos? Para calcular el nmero de carbohidratos de este ejemplo de comida, siga los pasos que se describen a continuacin. Ejemplo de comida  3  onzas (85g) de pechugas de pollo.  ? de taza o 4 onzas (106 g) de arroz integral.   taza o 3 onzas (85 g) de maz.  1 taza u 8 onzas fluidas (237 ml) de leche.  1 taza o 5onzas (150g) de fresas con crema batida sin azcar. Clculo de carbohidratos 1. Identifique los alimentos que contienen carbohidratos: ? Arroz. ? Maz. ? Leche. ? Jinny Sanders. 2. Calcule cuntas porciones come de cada alimento: ? 2 porciones de arroz. ? 1 porcin de maz. ? 1 porcin de leche. ? 1 porcin de fresas. 3. Multiplique cada nmero de porciones por 15g: ? 2 porciones de arroz x 15 g = 30 g. ? 1 porcin de maz x 15 g = 15 g. ? 1 porcin de leche x 15 g = 15 g. ? 1 porcin de fresas x 15 g = 15 g. 4. Sume todas las cantidades para conocer el total de gramos de carbohidratos consumidos: ? 30g + 15g + 15g + 15g = 75g de carbohidratos en total. Consejos para seguir este plan Al ir de compras  Elabore un plan de comidas y luego haga una lista de compras.  Compre verduras frescas y congeladas, frutas frescas y congeladas, productos lcteos, huevos, frijoles,  lentejas y cereales integrales.  Fjese en las etiquetas de los alimentos. Elija los alimentos que tengan ms fibra y Neurosurgeon.  Evite los alimentos procesados y los alimentos con Biochemist, clinical. Planificacin de las comidas  Trate de consumir la misma cantidad de carbohidratos en cada comida y en cada colacin.  Planifique tomar comidas y colaciones regulares y equilibradas. Dnde buscar ms informacin  American Diabetes Association (Asociacin Estadounidense de la Diabetes): www.diabetes.org  Centers for Disease Control and Prevention (Centros para el Control y la Prevencin de Event organiser): [email or web address] Resumen  El recuento de carbohidratos es un mtodo para llevar un registro de la cantidad de carbohidratos que se ingieren.  La ingesta natural de carbohidratos aumenta la cantidad de azcar (glucosa) en la sangre.  El recuento de la cantidad de carbohidratos que se ingieren mejora el control del nivel de [place], lo que ayuda a Company secretary la diabetes.  Un nutricionista puede ayudarlo a crear un plan de alimentacin y a calcular la cantidad de carbohidratos que debe ingerir en cada comida y colacin. Esta informacin no tiene Theme park manager el consejo del mdico. Asegrese de hacerle al mdico cualquier pregunta que tenga. Document Revised: 05/04/2019 Document Reviewed: 05/04/2019 Elsevier Patient Education  2021 [employer].

## 2020-11-30 ENCOUNTER — Other Ambulatory Visit: Payer: Self-pay

## 2020-11-30 DIAGNOSIS — E119 Type 2 diabetes mellitus without complications: Secondary | ICD-10-CM

## 2020-12-01 LAB — HEMOGLOBIN A1C
Est. average glucose Bld gHb Est-mCnc: 209 mg/dL
Hgb A1c MFr Bld: 8.9 % — ABNORMAL HIGH (ref 4.8–5.6)

## 2020-12-07 ENCOUNTER — Ambulatory Visit: Payer: Self-pay | Admitting: Gerontology

## 2020-12-07 ENCOUNTER — Other Ambulatory Visit: Payer: Self-pay

## 2020-12-07 ENCOUNTER — Encounter: Payer: Self-pay | Admitting: Gerontology

## 2020-12-07 VITALS — BP 130/82 | HR 80 | Temp 97.0°F | Resp 16 | Ht 63.0 in | Wt 165.1 lb

## 2020-12-07 DIAGNOSIS — E119 Type 2 diabetes mellitus without complications: Secondary | ICD-10-CM

## 2020-12-07 LAB — GLUCOSE, POCT (MANUAL RESULT ENTRY): POC Glucose: 171 mg/dl — AB (ref 70–99)

## 2020-12-07 NOTE — Progress Notes (Signed)
Established Patient Office Visit  Subjective:  Patient ID: Hector Weber, male    DOB: [birth year]  Age: 50 y.o. MRN: [id number]  CC:  Chief Complaint  Patient presents with   Follow-up    Labs drawn 11/30/20   Diabetes    Patient states he sometimes checks his blood sugar, but not consistently. Patient states he is taking DM meds as prescribed.    HPI [name] Is a 50 y/o male who has history of Anemia, Arthritis, CKD, T2DM, Hypertension, Hyperlipidemia,presents for routine follow up visit, lab review and medication refill. His HgbA1c done on 11/30/20 increased from 7.7% to 8.9%. He states that he checked his blood glucose many weeks ago. He states that he has missed taking his medications as ordered. His blood glucose was 171 mg/dl when checked during visit. He denies hypo/hyperglycemic symptoms, peripheral neuropathy and performs daily foot checks. Overall , he states that he's doing well and offers no further complaints.     Past Medical History:  Diagnosis Date   Anemia    Arthritis    Chronic kidney disease    Diabetes ([place])    Hyperlipidemia    Hypertension    Sleep apnea     Past Surgical History:  Procedure Laterality Date   NO PAST SURGERIES      Family History  Problem Relation Age of Onset   Diabetes type II Mother    Seizures Father    Diabetes Sister     Social History   Socioeconomic History   Marital status: Single    Spouse name: Not on file   Number of children: 3   Years of education: Not on file   Highest education level: Not on file  Occupational History    Comment: restaurant  Tobacco Use   Smoking status: Some Days    Types: Cigarettes, Cigars   Smokeless tobacco: Former    Types: Snuff, Chew    Quit date: 09/08/2000   Tobacco comments:    1 cigarette or cigar 2-3 times per week  Vaping Use   Vaping Use: Never used  Substance and Sexual Activity   Alcohol use: Yes    Comment: occassional   Drug use: Never   Sexual  activity: Yes    Birth control/protection: Coitus interruptus  Other Topics Concern   Not on file  Social History Narrative   Pays lots in child support, which takes a lot of his money. Unsure if he has applied for food stamps before.   Social Determinants of Health   Financial Resource Strain: Not on file  Food Insecurity: No Food Insecurity   Worried About Charity fundraiser in the Last Year: Never true   Ran Out of Food in the Last Year: Never true  Transportation Needs: No Transportation Needs   Lack of Transportation (Medical): No   Lack of Transportation (Non-Medical): No  Physical Activity: Not on file  Stress: Not on file  Social Connections: Not on file  Intimate Partner Violence: Not on file    Outpatient Medications Prior to Visit  Medication Sig Dispense Refill   atorvastatin (LIPITOR) 40 MG tablet Take 1 tablet (40 mg total) by mouth daily. 30 tablet 2   carvedilol (COREG) 12.5 MG tablet TAKE 1 TABLET BY MOUTH TWICE A DAY WITH A MEAL 60 tablet 2   glipiZIDE (GLUCOTROL XL) 10 MG 24 hr tablet Take 1 tablet (10 mg total) by mouth daily with breakfast. 30 tablet 2  metFORMIN (GLUCOPHAGE) 1000 MG tablet Take 1 tablet (1,000 mg total) by mouth 2 (two) times daily with a meal. Reported on 08/30/2015 60 tablet 2   No facility-administered medications prior to visit.    No Known Allergies  ROS Review of Systems  Constitutional: Negative.   Eyes: Negative.   Respiratory: Negative.    Cardiovascular: Negative.   Skin: Negative.   Neurological: Negative.   Psychiatric/Behavioral: Negative.       Objective:    Physical Exam HENT:     Head: Normocephalic and atraumatic.     Mouth/Throat:     Mouth: Mucous membranes are moist.  Eyes:     Extraocular Movements: Extraocular movements intact.     Conjunctiva/sclera: Conjunctivae normal.     Pupils: Pupils are equal, round, and reactive to light.  Cardiovascular:     Rate and Rhythm: Normal rate and regular rhythm.      Pulses: Normal pulses.     Heart sounds: Normal heart sounds.  Pulmonary:     Effort: Pulmonary effort is normal.     Breath sounds: Normal breath sounds.  Skin:    General: Skin is warm.  Neurological:     General: No focal deficit present.     Mental Status: [name] alert and oriented to person, place, and time. Mental status is at baseline.  Psychiatric:        Mood and Affect: Mood normal.        Behavior: Behavior normal.        Thought Content: Thought content normal.        Judgment: Judgment normal.    BP 130/82 (BP Location: Right Arm, Patient Position: Sitting, Cuff Size: Large)   Pulse 80   Temp (!) 97 F (36.1 C)   Resp 16   Ht '5\' 3"'  (1.6 m)   Wt 165 lb 1.6 oz (74.9 kg)   SpO2 97%   BMI 29.25 kg/m  Wt Readings from Last 3 Encounters:  12/07/20 165 lb 1.6 oz (74.9 kg)  11/30/20 164 lb 4.8 oz (74.5 kg)  09/08/20 164 lb 9.6 oz (74.7 kg)     Health Maintenance Due  Topic Date Due   PNEUMOCOCCAL POLYSACCHARIDE VACCINE AGE 72-64 HIGH RISK  Never done   Pneumococcal Vaccine 52-81 Years old (1 - PCV) Never done   Hepatitis C Screening  Never done   TETANUS/TDAP  Never done   OPHTHALMOLOGY EXAM  02/20/2013   COVID-19 Vaccine (3 - Booster for Pfizer series) 01/05/2020   INFLUENZA VACCINE  11/07/2020    There are no preventive care reminders to display for this patient.  Lab Results  Component Value Date   TSH 1.200 05/20/2018   Lab Results  Component Value Date   WBC 5.3 08/31/2020   HGB 15.2 08/31/2020   HCT 46.0 08/31/2020   MCV 90 08/31/2020   PLT 237 08/31/2020   Lab Results  Component Value Date   NA 141 08/31/2020   K 4.5 08/31/2020   CO2 22 08/31/2020   GLUCOSE 201 (H) 08/31/2020   BUN 16 08/31/2020   CREATININE 0.66 (L) 08/31/2020   BILITOT 0.5 08/31/2020   ALKPHOS 83 08/31/2020   AST 19 08/31/2020   ALT 33 08/31/2020   PROT 7.1 08/31/2020   ALBUMIN 4.6 08/31/2020   CALCIUM 8.8 08/31/2020   EGFR 116 08/31/2020   Lab Results   Component Value Date   CHOL 130 08/31/2020   Lab Results  Component Value Date   HDL  46 08/31/2020   Lab Results  Component Value Date   LDLCALC 70 08/31/2020   Lab Results  Component Value Date   TRIG 66 08/31/2020   Lab Results  Component Value Date   CHOLHDL 2.8 08/31/2020   Lab Results  Component Value Date   HGBA1C 8.9 (H) 11/30/2020      Assessment & Plan:   1. Diabetes mellitus without complication ([place]) - His HgbA1c was 8.9%, and his goal should be less than 7%. His Diabetes is not controlled due to non compliance. He has not picked up his medication refills for 2 months. He was educated on the complications of uncontrolled Diabetes. He was strongly encouraged to check his blood glucose, record and bring glucometer to follow up appointment, pick up his medication, low carb/non concentrated sweet diet. - POCT Glucose (CBG); Future - POCT Glucose (CBG)     Follow-up: Return in about 5 weeks (around 01/11/2021), or if symptoms worsen or fail to improve.    Asha Grumbine Jerold Coombe, NP

## 2020-12-07 NOTE — Patient Instructions (Signed)
Recuento de carbohidratos para la diabetes mellitus en los adultos Carbohydrate Counting for Diabetes Mellitus, Adult El recuento de carbohidratos es un mtodo para llevar un registro de la cantidad de carbohidratos que se ingieren. La ingesta natural de carbohidratos aumenta la cantidad de azcar (glucosa) en la sangre. El recuento de la cantidad de carbohidratos que se ingieren mejora el control del nivel de glucemia, lo que ayuda a manejar la diabetes. Es importante saber la cantidad de carbohidratos que se pueden ingerir en cada comida sin correr ningn riesgo. Esto es diferente en cada persona. Un nutricionista puede ayudarlo a crear un plan de alimentacin y a calcular la cantidad de carbohidratos que debe ingerir en cada comida y colacin. Qu alimentos contienen carbohidratos? Los siguientes alimentos incluyen carbohidratos: Granos, como panes y cereales. Frijoles secos y productos con soja. Verduras con almidn, como papas, guisantes y maz. Frutas y jugos de frutas. Leche y yogur. Dulces y colaciones, como pasteles, galletas, caramelos, papas fritas de bolsa y refrescos. Cmo se calculan los carbohidratos de los alimentos? Hay dos maneras de calcular los carbohidratos de los alimentos. Puede leer las etiquetas de los alimentos o aprender cules son los tamaos de las porciones estndar de los alimentos. Puede usar cualquiera de los dos mtodos o una combinacin de ambos. Usar la etiqueta de informacin nutricional La lista de informacin nutricional est incluida en las etiquetas de casi todas las bebidas y los alimentos envasados de los [place]. Incluye lo siguiente: El tamao de la porcin. Informacin sobre los nutrientes de cada porcin, incluidos los gramos (g) de carbohidratos por porcin. Para usar la informacin nutricional: Decida cuntas porciones va a comer. Multiplique la cantidad de porciones por el nmero de carbohidratos por porcin. El resultado es la cantidad  total de carbohidratos que comer. Conocer los tamaos de las porciones estndar de los alimentos Cuando coma alimentos que contengan carbohidratos y que no estn envasados o no incluyan la informacin nutricional en la etiqueta, debe medir las porciones para poder calcular la cantidad de carbohidratos. Mida los alimentos que comer con una balanza de alimentos o una taza medidora, si es necesario. Decida cuntas porciones de tamao estndar comer. Multiplique el nmero de porciones por 15. En los alimentos que contienen carbohidratos, una porcin equivale a 15 g de carbohidratos. Por ejemplo, si come 2 tazas o 10 onzas (300 g) de fresas, habr comido 2 porciones y 30 g de carbohidratos (2 porciones x 15 g = 30 g). En el caso de las comidas que contienen mezclas de ms de un alimento, como las sopas y los guisos, debe calcular los carbohidratos de cada alimento que se incluye. La siguiente lista contiene los tamaos de porciones estndar de los alimentos ricos en carbohidratos ms comunes. Cada una de estas porciones tiene aproximadamente 15 g de carbohidratos: 1 rebanada de pan. 1 tortilla de seis pulgadas (15 cm). ? de taza o 2 onzas (53 g) de arroz o pasta cocidos.  taza o 3 onzas (85 g) de lentejas o frijoles cocidos o enlatados, escurridos y enjuagados.  taza o 3 onzas (85 g) de verduras con almidn, como guisantes, maz o zapallo.  taza o 4 onzas (120 g) de cereal caliente.  taza o 3 onzas (85 g) de papas hervidas o en pur, o  o 3 onzas (85 g) de una papa grande al horno.  taza o 4 onzas fluidas (118 ml) de jugo de frutas. 1 taza u 8 onzas fluidas (237 ml) de leche. 1 unidad pequea o   4 onzas (106 g) de manzana.  unidad o 2 onzas (63 g) de una banana mediana. 1 taza o 5 oz (150 g) de fresas. 3 tazas o 1 oz (24 g) de palomitas de maz. Cul sera un ejemplo de recuento de carbohidratos? Para calcular el nmero de carbohidratos de este ejemplo de comida, siga los pasos que se  describen a continuacin. Ejemplo de comida 3 onzas (85 g) de pechugas de pollo. ? de taza o 4 onzas (106 g) de arroz integral.  taza o 3 onzas (85 g) de maz. 1 taza u 8 onzas fluidas (237 ml) de leche. 1 taza o 5 onzas (150 g) de fresas con crema batida sin azcar. Clculo de carbohidratos Identifique los alimentos que contienen carbohidratos: Arroz. Maz. Leche. Fresas. Calcule cuntas porciones come de cada alimento: 2 porciones de arroz. 1 porcin de maz. 1 porcin de leche. 1 porcin de fresas. Multiplique cada nmero de porciones por 15 g: 2 porciones de arroz x 15 g = 30 g. 1 porcin de maz x 15 g = 15 g. 1 porcin de leche x 15 g = 15 g. 1 porcin de fresas x 15 g = 15 g. Sume todas las cantidades para conocer el total de gramos de carbohidratos consumidos: 30 g + 15 g + 15 g + 15 g = 75 g de carbohidratos en total. Consejos para seguir este plan Al ir de compras [place] un plan de comidas y luego haga una lista de compras. Compre verduras frescas y congeladas, frutas frescas y congeladas, productos lcteos, huevos, frijoles, lentejas y cereales integrales. Fjese en las etiquetas de los alimentos. Elija los alimentos que tengan ms fibra y menos azcar. Evite los alimentos procesados y los alimentos con azcares agregados. Planificacin de las comidas Trate de consumir la misma cantidad de carbohidratos en cada comida y en cada colacin. Planifique tomar comidas y colaciones regulares y equilibradas. Dnde buscar ms informacin American Diabetes Association (Asociacin Estadounidense de la Diabetes): www.diabetes.org Centers for Disease Control and Prevention (Centros para el Control y la Prevencin de Enfermedades): www.cdc.gov Resumen El recuento de carbohidratos es un mtodo para llevar un registro de la cantidad de carbohidratos que se ingieren. La ingesta natural de carbohidratos aumenta la cantidad de azcar (glucosa) en la sangre. El recuento de la  cantidad de carbohidratos que se ingieren mejora el control del nivel de glucemia, lo que ayuda a manejar la diabetes. Un nutricionista puede ayudarlo a crear un plan de alimentacin y a calcular la cantidad de carbohidratos que debe ingerir en cada comida y colacin. Esta informacin no tiene como fin reemplazar el consejo del mdico. Asegrese de hacerle al mdico cualquier pregunta que tenga. Document Revised: 05/04/2019 Document Reviewed: 05/04/2019 Elsevier Patient Education  2021 Elsevier Inc.  

## 2021-01-11 ENCOUNTER — Other Ambulatory Visit: Payer: Self-pay

## 2021-01-11 ENCOUNTER — Ambulatory Visit: Payer: Self-pay | Admitting: Gerontology

## 2021-01-11 ENCOUNTER — Encounter: Payer: Self-pay | Admitting: Gerontology

## 2021-01-11 VITALS — BP 128/81 | HR 76 | Temp 98.6°F | Resp 16 | Ht 64.0 in | Wt 162.0 lb

## 2021-01-11 DIAGNOSIS — E119 Type 2 diabetes mellitus without complications: Secondary | ICD-10-CM

## 2021-01-11 LAB — GLUCOSE, POCT (MANUAL RESULT ENTRY): POC Glucose: 166 mg/dl — AB (ref 70–99)

## 2021-01-11 NOTE — Progress Notes (Signed)
Established Patient Office Visit  Subjective:  Patient ID: Hector Weber, male    DOB: 1970/06/08  Age: 50 y.o. MRN: [id number]  CC:  Chief Complaint  Patient presents with   Follow-up   Diabetes    Patient states he is checking his blood sugars at home. He states his FBS was 152 this morning. Patient states he is taking his DM as prescribed.   Hypertension    Patient states he is not checking his blood pressure at home. Patient states he is taking HTN meds as prescribed.    HPI Hector Weber  is a 50 y/o male who has history of Anemia, Arthritis, CKD, T2DM, Hypertension, Hyperlipidemia presents for follow up of uncontrolled type 2 diabetes. His HgbA1c done on 11/30/20 increased from 7.7% to 8.9%. He  states that he checks his fasting blood glucose at least 4 - 5 times a week, and his blood glucose reading today was 152 mg/dl.  His blood glucose was checked during visit and it was 166 mg per DL.  He states that he is compliant with his medications, denies side effect and continues to work on his diet.  He denies hypo/hyperglycemia, peripheral neuropathy and performs daily foot checks.  Overall, he states that he is doing well and offers no further complaint.  Past Medical History:  Diagnosis Date   Anemia    Arthritis    Chronic kidney disease    Diabetes ([place])    Hyperlipidemia    Hypertension    Sleep apnea     Past Surgical History:  Procedure Laterality Date   NO PAST SURGERIES      Family History  Problem Relation Age of Onset   Diabetes type II Mother    Seizures Father    Diabetes Sister     Social History   Socioeconomic History   Marital status: Single    Spouse name: Not on file   Number of children: 3   Years of education: Not on file   Highest education level: Not on file  Occupational History    Comment: restaurant  Tobacco Use   Smoking status: Some Days    Types: Cigarettes, Cigars   Smokeless tobacco: Former    Types: Snuff, Chew     Quit date: 09/08/2000   Tobacco comments:    1 cigarette or cigar 2-3 times per week  Vaping Use   Vaping Use: Never used  Substance and Sexual Activity   Alcohol use: Yes    Comment: occassional   Drug use: Never   Sexual activity: Yes    Birth control/protection: Coitus interruptus  Other Topics Concern   Not on file  Social History Narrative   Pays lots in child support, which takes a lot of his money. Unsure if he has applied for food stamps before.   Social Determinants of Health   Financial Resource Strain: Not on file  Food Insecurity: No Food Insecurity   Worried About Charity fundraiser in the Last Year: Never true   Ran Out of Food in the Last Year: Never true  Transportation Needs: No Transportation Needs   Lack of Transportation (Medical): No   Lack of Transportation (Non-Medical): No  Physical Activity: Not on file  Stress: Not on file  Social Connections: Not on file  Intimate Partner Violence: Not on file    Outpatient Medications Prior to Visit  Medication Sig Dispense Refill   atorvastatin (LIPITOR) 40 MG tablet Take 1  tablet (40 mg total) by mouth daily. 30 tablet 2   carvedilol (COREG) 12.5 MG tablet TAKE 1 TABLET BY MOUTH TWICE A DAY WITH A MEAL 60 tablet 2   glipiZIDE (GLUCOTROL XL) 10 MG 24 hr tablet Take 1 tablet (10 mg total) by mouth daily with breakfast. 30 tablet 2   metFORMIN (GLUCOPHAGE) 1000 MG tablet Take 1 tablet (1,000 mg total) by mouth 2 (two) times daily with a meal. Reported on 08/30/2015 60 tablet 2   No facility-administered medications prior to visit.    No Known Allergies  ROS Review of Systems  Constitutional: Negative.   Eyes: Negative.   Respiratory: Negative.    Cardiovascular: Negative.   Endocrine: Negative.   Neurological: Negative.   Psychiatric/Behavioral: Negative.       Objective:    Physical Exam HENT:     Head: Normocephalic and atraumatic.     Mouth/Throat:     Mouth: Mucous membranes are moist.  Eyes:      Extraocular Movements: Extraocular movements intact.     Conjunctiva/sclera: Conjunctivae normal.     Pupils: Pupils are equal, round, and reactive to light.  Cardiovascular:     Rate and Rhythm: Normal rate and regular rhythm.     Pulses: Normal pulses.     Heart sounds: Normal heart sounds.  Pulmonary:     Effort: Pulmonary effort is normal.     Breath sounds: Normal breath sounds.  Skin:    General: Skin is warm.  Neurological:     General: No focal deficit present.     Mental Status: [name] alert and oriented to person, place, and time. Mental status is at baseline.  Psychiatric:        Mood and Affect: Mood normal.        Behavior: Behavior normal.        Thought Content: Thought content normal.        Judgment: Judgment normal.    BP 128/81 (BP Location: Right Arm, Patient Position: Sitting, Cuff Size: Large)   Pulse 76   Temp 98.6 F (37 C)   Resp 16   Ht _0  (1.626 m)   Wt 162 lb (73.5 kg)   SpO2 97%   BMI 27.81 kg/m  Wt Readings from Last 3 Encounters:  01/11/21 162 lb (73.5 kg)  12/07/20 165 lb 1.6 oz (74.9 kg)  11/30/20 164 lb 4.8 oz (74.5 kg)     Health Maintenance Due  Topic Date Due   Hepatitis C Screening  Never done   TETANUS/TDAP  Never done   OPHTHALMOLOGY EXAM  02/20/2013   COVID-19 Vaccine (3 - Booster for Pfizer series) 01/05/2020   INFLUENZA VACCINE  11/07/2020    There are no preventive care reminders to display for this patient.  Lab Results  Component Value Date   TSH 1.200 05/20/2018   Lab Results  Component Value Date   WBC 5.3 08/31/2020   HGB 15.2 08/31/2020   HCT 46.0 08/31/2020   MCV 90 08/31/2020   PLT 237 08/31/2020   Lab Results  Component Value Date   NA 141 08/31/2020   K 4.5 08/31/2020   CO2 22 08/31/2020   GLUCOSE 201 (H) 08/31/2020   BUN 16 08/31/2020   CREATININE 0.66 (L) 08/31/2020   BILITOT 0.5 08/31/2020   ALKPHOS 83 08/31/2020   AST 19 08/31/2020   ALT 33 08/31/2020   PROT 7.1 08/31/2020    ALBUMIN 4.6 08/31/2020   CALCIUM 8.8 08/31/2020  EGFR 116 08/31/2020   Lab Results  Component Value Date   CHOL 130 08/31/2020   Lab Results  Component Value Date   HDL 46 08/31/2020   Lab Results  Component Value Date   LDLCALC 70 08/31/2020   Lab Results  Component Value Date   TRIG 66 08/31/2020   Lab Results  Component Value Date   CHOLHDL 2.8 08/31/2020   Lab Results  Component Value Date   HGBA1C 8.9 (H) 11/30/2020      Assessment & Plan:     1. Diabetes mellitus without complication ([place]) -His hemoglobin A1c was 8.9%, his goal should be less than 7%.  He was encouraged to check his blood glucose at least twice daily, record and bring the log to follow-up appointment.  He was encouraged to continue on low carbohydrate/non concentrated sweet diet and exercise as tolerated. - POCT Glucose (CBG); Future - HgB A1c; Future - POCT Glucose (CBG)   Follow-up: Return in about 2 months (around 03/14/2021), or if symptoms worsen or fail to improve.    Jadian Karman Jerold Coombe, NP

## 2021-01-11 NOTE — Patient Instructions (Signed)
Recuento de carbohidratos para la diabetes mellitus en los adultos Carbohydrate Counting for Diabetes Mellitus, Adult El recuento de carbohidratos es un mtodo para llevar un registro de la cantidad de carbohidratos que se ingieren. La ingesta natural de carbohidratos aumenta la cantidad de azcar (glucosa) en la sangre. El recuento de la cantidad de carbohidratos que se ingieren mejora el control del nivel de glucemia, lo que ayuda a manejar la diabetes. Es importante saber la cantidad de carbohidratos que se pueden ingerir en cada comida sin correr ningn riesgo. Esto es diferente en cada persona. Un nutricionista puede ayudarlo a crear un plan de alimentacin y a calcular la cantidad de carbohidratos que debe ingerir en cada comida y colacin. Qu alimentos contienen carbohidratos? Los siguientes alimentos incluyen carbohidratos: Granos, como panes y cereales. Frijoles secos y productos con soja. Verduras con almidn, como papas, guisantes y maz. Frutas y jugos de frutas. Leche y yogur. Dulces y colaciones, como pasteles, galletas, caramelos, papas fritas de bolsa y refrescos. Cmo se calculan los carbohidratos de los alimentos? Hay dos maneras de calcular los carbohidratos de los alimentos. Puede leer las etiquetas de los alimentos o aprender cules son los tamaos de las porciones estndar de los alimentos. Puede usar cualquiera de los dos mtodos o una combinacin de ambos. Usar la etiqueta de informacin nutricional La lista de informacin nutricional est incluida en las etiquetas de casi todas las bebidas y los alimentos envasados de los [place]. Incluye lo siguiente: El tamao de la porcin. Informacin sobre los nutrientes de cada porcin, incluidos los gramos (g) de carbohidratos por porcin. Para usar la informacin nutricional: Decida cuntas porciones va a comer. Multiplique la cantidad de porciones por el nmero de carbohidratos por porcin. El resultado es la cantidad  total de carbohidratos que comer. Conocer los tamaos de las porciones estndar de los alimentos Cuando coma alimentos que contengan carbohidratos y que no estn envasados o no incluyan la informacin nutricional en la etiqueta, debe medir las porciones para poder calcular la cantidad de carbohidratos. Mida los alimentos que comer con una balanza de alimentos o una taza medidora, si es necesario. Decida cuntas porciones de tamao estndar comer. Multiplique el nmero de porciones por 15. En los alimentos que contienen carbohidratos, una porcin equivale a 15 g de carbohidratos. Por ejemplo, si come 2 tazas o 10 onzas (300 g) de fresas, habr comido 2 porciones y 30 g de carbohidratos (2 porciones x 15 g = 30 g). En el caso de las comidas que contienen mezclas de ms de un alimento, como las sopas y los guisos, debe calcular los carbohidratos de cada alimento que se incluye. La siguiente lista contiene los tamaos de porciones estndar de los alimentos ricos en carbohidratos ms comunes. Cada una de estas porciones tiene aproximadamente 15 g de carbohidratos: 1 rebanada de pan. 1 tortilla de seis pulgadas (15 cm). ? de taza o 2 onzas (53 g) de arroz o pasta cocidos.  taza o 3 onzas (85 g) de lentejas o frijoles cocidos o enlatados, escurridos y enjuagados.  taza o 3 onzas (85 g) de verduras con almidn, como guisantes, maz o zapallo.  taza o 4 onzas (120 g) de cereal caliente.  taza o 3 onzas (85 g) de papas hervidas o en pur, o  o 3 onzas (85 g) de una papa grande al horno.  taza o 4 onzas fluidas (118 ml) de jugo de frutas. 1 taza u 8 onzas fluidas (237 ml) de leche. 1 unidad pequea o   4 onzas (106 g) de manzana.  unidad o 2 onzas (63 g) de una banana mediana. 1 taza o 5 oz (150 g) de fresas. 3 tazas o 1 oz (24 g) de palomitas de maz. Cul sera un ejemplo de recuento de carbohidratos? Para calcular el nmero de carbohidratos de este ejemplo de comida, siga los pasos que se  describen a continuacin. Ejemplo de comida 3 onzas (85 g) de pechugas de pollo. ? de taza o 4 onzas (106 g) de arroz integral.  taza o 3 onzas (85 g) de maz. 1 taza u 8 onzas fluidas (237 ml) de leche. 1 taza o 5 onzas (150 g) de fresas con crema batida sin azcar. Clculo de carbohidratos Identifique los alimentos que contienen carbohidratos: Arroz. Maz. Leche. Fresas. Calcule cuntas porciones come de cada alimento: 2 porciones de arroz. 1 porcin de maz. 1 porcin de leche. 1 porcin de fresas. Multiplique cada nmero de porciones por 15 g: 2 porciones de arroz x 15 g = 30 g. 1 porcin de maz x 15 g = 15 g. 1 porcin de leche x 15 g = 15 g. 1 porcin de fresas x 15 g = 15 g. Sume todas las cantidades para conocer el total de gramos de carbohidratos consumidos: 30 g + 15 g + 15 g + 15 g = 75 g de carbohidratos en total. Consejos para seguir este plan Al ir de compras [place] un plan de comidas y luego haga una lista de compras. Compre verduras frescas y congeladas, frutas frescas y congeladas, productos lcteos, huevos, frijoles, lentejas y cereales integrales. Fjese en las etiquetas de los alimentos. Elija los alimentos que tengan ms fibra y menos azcar. Evite los alimentos procesados y los alimentos con azcares agregados. Planificacin de las comidas Trate de consumir la misma cantidad de carbohidratos en cada comida y en cada colacin. Planifique tomar comidas y colaciones regulares y equilibradas. Dnde buscar ms informacin American Diabetes Association (Asociacin Estadounidense de la Diabetes): www.diabetes.org Centers for Disease Control and Prevention (Centros para el Control y la Prevencin de Enfermedades): www.cdc.gov Resumen El recuento de carbohidratos es un mtodo para llevar un registro de la cantidad de carbohidratos que se ingieren. La ingesta natural de carbohidratos aumenta la cantidad de azcar (glucosa) en la sangre. El recuento de la  cantidad de carbohidratos que se ingieren mejora el control del nivel de glucemia, lo que ayuda a manejar la diabetes. Un nutricionista puede ayudarlo a crear un plan de alimentacin y a calcular la cantidad de carbohidratos que debe ingerir en cada comida y colacin. Esta informacin no tiene como fin reemplazar el consejo del mdico. Asegrese de hacerle al mdico cualquier pregunta que tenga. Document Revised: 05/04/2019 Document Reviewed: 05/04/2019 Elsevier Patient Education  2021 Elsevier Inc.  

## 2021-03-08 ENCOUNTER — Other Ambulatory Visit: Payer: Self-pay

## 2021-03-08 ENCOUNTER — Telehealth: Payer: Self-pay

## 2021-03-08 ENCOUNTER — Other Ambulatory Visit: Payer: Self-pay | Admitting: Gerontology

## 2021-03-08 VITALS — BP 131/81 | HR 77 | Temp 98.6°F | Ht 64.0 in | Wt 164.9 lb

## 2021-03-08 DIAGNOSIS — E785 Hyperlipidemia, unspecified: Secondary | ICD-10-CM

## 2021-03-08 DIAGNOSIS — Z23 Encounter for immunization: Secondary | ICD-10-CM

## 2021-03-08 DIAGNOSIS — I10 Essential (primary) hypertension: Secondary | ICD-10-CM

## 2021-03-08 DIAGNOSIS — E119 Type 2 diabetes mellitus without complications: Secondary | ICD-10-CM

## 2021-03-08 NOTE — Telephone Encounter (Signed)
[employer] [facility]. Prescriptions are there and ready for patient to pick up. Wife notified of above.

## 2021-03-08 NOTE — Telephone Encounter (Signed)
Need to verify medications have been sent to [facility].

## 2021-03-09 LAB — HEMOGLOBIN A1C
Est. average glucose Bld gHb Est-mCnc: 146 mg/dL
Hgb A1c MFr Bld: 6.7 % — ABNORMAL HIGH (ref 4.8–5.6)

## 2021-03-15 ENCOUNTER — Encounter: Payer: Self-pay | Admitting: Gerontology

## 2021-03-15 ENCOUNTER — Other Ambulatory Visit: Payer: Self-pay

## 2021-03-15 ENCOUNTER — Ambulatory Visit: Payer: Self-pay | Admitting: Gerontology

## 2021-03-15 VITALS — BP 118/71 | HR 76 | Temp 97.8°F | Resp 16 | Ht 63.0 in | Wt 165.0 lb

## 2021-03-15 DIAGNOSIS — E119 Type 2 diabetes mellitus without complications: Secondary | ICD-10-CM

## 2021-03-15 DIAGNOSIS — I10 Essential (primary) hypertension: Secondary | ICD-10-CM

## 2021-03-15 LAB — GLUCOSE, POCT (MANUAL RESULT ENTRY): POC Glucose: 325 mg/dl — AB (ref 70–99)

## 2021-03-15 NOTE — Patient Instructions (Signed)
Recuento de carbohidratos para la diabetes mellitus en los adultos ?Carbohydrate Counting for Diabetes Mellitus, Adult ?El recuento de carbohidratos es un m?todo para llevar un registro de la cantidad de carbohidratos que se ingieren. La ingesta de carbohidratos aumenta la cantidad de az?car (glucosa) en la sangre. El recuento de la cantidad de carbohidratos que ingiere mejora el control de su glucemia. Esto, a su vez, le ayuda a controlar su diabetes. ?Los carbohidratos se miden en gramos (g) por porci?n. Es importante saber la cantidad de carbohidratos (en gramos o por tama?o de porci?n) que se puede ingerir en cada comida. Esto es diferente en cada persona. Un nutricionista puede ayudarlo a crear un plan de alimentaci?n y a calcular la cantidad de carbohidratos que debe ingerir en cada comida y colaci?n. ??Qu? alimentos contienen carbohidratos? ?Los siguientes alimentos incluyen carbohidratos: ?Granos, como panes y cereales. ?Frijoles secos y productos con soja. ?Verduras con almid?n, como papas, guisantes y ma?z. ?Frutas y jugos de frutas. ?Leche y yogur. ?Dulces y colaciones, como pasteles, galletas, caramelos, papas fritas de bolsa y refrescos. ??C?mo se calculan los carbohidratos de los alimentos? ?Hay dos maneras de calcular los carbohidratos de los alimentos. Puede leer las etiquetas de los alimentos o aprender cu?les son los tama?os de las porciones est?ndar de los alimentos. Puede usar cualquiera de estos m?todos o una combinaci?n de ambos. ?Usar la etiqueta de informaci?n nutricional ?La lista de Informaci?n nutricional est? incluida en las etiquetas de casi todas las bebidas y los alimentos envasados de los [place]. Esto incluye lo siguiente: ?El tama?o de la porci?n. ?Informaci?n sobre los nutrientes de cada porci?n, incluidos los gramos de carbohidratos por porci?n. ?Para usar la Informaci?n nutricional, decida cu?ntas porciones tomar?. Luego, multiplique el n?mero de porciones por la cantidad  de carbohidratos por porci?n. El n?mero resultante es la cantidad total de carbohidratos que comer?. ?Conocer los tama?os de las porciones est?ndar de los alimentos ?Cuando coma alimentos que contengan carbohidratos y que no est?n envasados o no incluyan la informaci?n nutricional en la etiqueta, debe medir las porciones para poder calcular los gramos de carbohidratos. ?Mida los alimentos que comer? con una balanza de alimentos o una taza medidora, si es necesario. ?Decida cu?ntas porciones de tama?o est?ndar comer?. ?Multiplique el n?mero de porciones por 15. En los alimentos que contienen carbohidratos, una porci?n equivale a 15 g de carbohidratos. ?Por ejemplo, si come 2 tazas o 10 onzas (300 g) de fresas, habr? comido 2 porciones y 30 g de carbohidratos (2 porciones x 15 g = 30 g). ?En el caso de las comidas que contienen mezclas de m?s de un alimento, como las sopas y los guisos, debe calcular los carbohidratos de cada alimento que se incluye. ?La siguiente lista contiene los tama?os de porciones est?ndar de los alimentos ricos en carbohidratos m?s comunes. Cada una de estas porciones tiene aproximadamente 15 g de carbohidratos: ?1 rebanada de pan. ?1 tortilla de seis pulgadas (15 cm). ?? de taza o 2 onzas (53 g) de arroz o pasta cocidos. ?? taza o 3 onzas (85 g) de lentejas o frijoles cocidos o enlatados, escurridos y enjuagados. ?? taza o 3 onzas (85 g) de verduras con almid?n, como guisantes, ma?z o zapallo. ?? taza o 4 onzas (120 g) de cereal caliente. ?? taza o 3 onzas (85 g) de papas hervidas o en pur?, o ? o 3 onzas (85 g) de una papa grande al horno. ?? taza o 4 onzas fluidas (118 ml) de jugo de frutas. ?1 taza u   8 onzas fluidas (237 ml) de leche. ?1 unidad peque?a o 4 onzas (106 g) de manzana. ?? unidad o 2 onzas (63 g) de una banana mediana. ?1 taza o 5 oz (150 g) de fresas. ?3 tazas o 1 oz (28.3 g) de palomitas de ma?z. ??Cu?l ser?a un ejemplo de recuento de carbohidratos? ?Para calcular los gramos  de carbohidratos de este ejemplo de comida, siga los pasos que se describen a continuaci?n. ?Ejemplo de comida ?3 onzas (85 g) de pechugas de pollo. ?? de taza o 4 onzas (106 g) de arroz integral. ?? taza o 3 onzas (85 g) de ma?z. ?1 taza u 8 onzas fluidas (237 ml) de leche. ?1 taza o 5 onzas (150 g) de fresas con crema batida sin az?car. ?C?lculo de carbohidratos ?Identifique los alimentos que contienen carbohidratos: ?Arroz. ?Ma?z. ?Leche. ?Fresas. ?Calcule cu?ntas porciones come de cada alimento: ?2 porciones de arroz. ?1 porci?n de ma?z. ?1 porci?n de leche. ?1 porci?n de fresas. ?Multiplique cada n?mero de porciones por 15 g: ?2 porciones de arroz x 15 g = 30 g. ?1 porci?n de ma?z x 15 g = 15 g. ?1 porci?n de leche x 15 g = 15 g. ?1 porci?n de fresas x 15 g = 15 g. ?Sume todas las cantidades para conocer el total de gramos de carbohidratos consumidos: ?30 g + 15 g + 15 g + 15 g = 75 g de carbohidratos en total. ?Consejos para seguir este plan ?Al ir de compras ?Elabore un plan de comidas y luego haga una lista de compras. ?Compre verduras frescas y congeladas, frutas frescas y congeladas, productos l?cteos, huevos, frijoles, lentejas y cereales integrales. ?F?jese en las etiquetas de los alimentos. Elija los alimentos que tengan m?s fibra y menos az?car. ?Evite los alimentos procesados y los alimentos con az?cares agregados. ?Planificaci?n de las comidas ?Trate de consumir la misma cantidad de gramos de carbohidratos en cada comida y en cada colaci?n. ?Planifique tomar comidas y colaciones regulares y equilibradas. ?D?nde buscar m?s informaci?n ?American Diabetes Association (Asociaci?n Estadounidense de la Diabetes): diabetes.org ?Centers for Disease Control and Prevention (Centros para el Control y la Prevenci?n de Enfermedades): cdc.gov ?Academy of Nutrition and Dietetics (Academia de Nutrici?n y Diet?tica): eatright.org ?Association of Diabetes Care & Education Specialists (Asociaci?n de Especialistas en  Atenci?n y Educaci?n sobre la Diabetes): diabeteseducator.org ?Resumen ?El recuento de carbohidratos es un m?todo para llevar un registro de la cantidad de carbohidratos que se ingieren. ?La ingesta de carbohidratos aumenta la cantidad de az?car (glucosa) en la sangre. ?El recuento de la cantidad de carbohidratos que ingiere mejora el control de su glucemia. Esto le ayuda a controlar su diabetes. ?Un nutricionista puede ayudarlo a crear un plan de alimentaci?n y a calcular la cantidad de carbohidratos que debe ingerir en cada comida y colaci?n. ?Esta informaci?n no tiene como fin reemplazar el consejo del m?dico. Aseg?rese de hacerle al m?dico cualquier pregunta que tenga. ?Document Revised: 12/15/2019 Document Reviewed: 12/15/2019 ?Elsevier Patient Education ? 2022 Elsevier Inc. ? ?

## 2021-03-15 NOTE — Progress Notes (Signed)
Established Patient Office Visit  Subjective:  Patient ID: Hector Weber, male    DOB: [birth year]  Age: 50 y.o. MRN: [id number]  CC:  Chief Complaint  Patient presents with   Follow-up   Diabetes    Last A1C drawn 03/08/21    HPI Hector Weber Kraynak  is a 50 y/o male who has history of Anemia, Arthritis, CKD, T2DM, Hypertension, Hyperlipidemia presents for lab review and medication refill. His HgbA1c done on 03/08/21 decreased from 8.9% to 6.7%. He states that he's compliant with his medications,continues to make healthy lifestyle changes. He checks his blood glucose 5 times weekly, but has adhered to ADA diet. His blood glucose checked during visit was 325 mg/dl, he said that he ate lunch prior to clinic visit. He denies hypo/hyperglycemic symptoms, peripheral neuropathy and performs daily foot checks. Overall, he states that he's doing well and offers no further compliant.  Past Medical History:  Diagnosis Date   Anemia    Arthritis    Chronic kidney disease    Diabetes ([place])    Hyperlipidemia    Hypertension    Sleep apnea     Past Surgical History:  Procedure Laterality Date   NO PAST SURGERIES      Family History  Problem Relation Age of Onset   Diabetes type II Mother    Seizures Father    Diabetes Sister     Social History   Socioeconomic History   Marital status: Single    Spouse name: Not on file   Number of children: 3   Years of education: Not on file   Highest education level: Not on file  Occupational History    Comment: restaurant  Tobacco Use   Smoking status: Some Days    Types: Cigarettes, Cigars   Smokeless tobacco: Former    Types: Snuff, Chew    Quit date: 09/08/2000   Tobacco comments:    1 cigarette or cigar 2-3 times per week  Vaping Use   Vaping Use: Never used  Substance and Sexual Activity   Alcohol use: Yes    Comment: occassional   Drug use: Never   Sexual activity: Yes    Birth control/protection: Coitus interruptus   Other Topics Concern   Not on file  Social History Narrative   Pays lots in child support, which takes a lot of his money. Unsure if he has applied for food stamps before.   Social Determinants of Health   Financial Resource Strain: Not on file  Food Insecurity: No Food Insecurity   Worried About Charity fundraiser in the Last Year: Never true   Ran Out of Food in the Last Year: Never true  Transportation Needs: No Transportation Needs   Lack of Transportation (Medical): No   Lack of Transportation (Non-Medical): No  Physical Activity: Not on file  Stress: Not on file  Social Connections: Not on file  Intimate Partner Violence: Not on file    Outpatient Medications Prior to Visit  Medication Sig Dispense Refill   atorvastatin (LIPITOR) 40 MG tablet TAKE 1 TABLET BY MOUTH DAILY 30 tablet 2   carvedilol (COREG) 12.5 MG tablet TAKE 1 TABLET BY MOUTH TWICE A DAY WITH A MEAL 60 tablet 2   glipiZIDE (GLUCOTROL XL) 10 MG 24 hr tablet TAKE 1 TABLET BY MOUTH DAILY WITH BREAKFAST 30 tablet 2   metFORMIN (GLUCOPHAGE) 1000 MG tablet TAKE 1 TABLET BY MOUTH TWICE A DAY WITH A MEAL 60 tablet  2   No facility-administered medications prior to visit.    No Known Allergies  ROS Review of Systems  Constitutional: Negative.   Eyes: Negative.   Respiratory: Negative.    Cardiovascular: Negative.   Endocrine: Negative.   Skin: Negative.   Neurological: Negative.      Objective:    Physical Exam HENT:     Head: Normocephalic and atraumatic.     Mouth/Throat:     Mouth: Mucous membranes are moist.  Eyes:     Extraocular Movements: Extraocular movements intact.     Conjunctiva/sclera: Conjunctivae normal.     Pupils: Pupils are equal, round, and reactive to light.  Cardiovascular:     Rate and Rhythm: Normal rate and regular rhythm.     Pulses: Normal pulses.     Heart sounds: Normal heart sounds.  Pulmonary:     Effort: Pulmonary effort is normal.     Breath sounds: Normal  breath sounds.  Skin:    General: Skin is warm.  Neurological:     General: No focal deficit present.     Mental Status: [name] alert and oriented to person, place, and time. Mental status is at baseline.  Psychiatric:        Mood and Affect: Mood normal.        Behavior: Behavior normal.        Thought Content: Thought content normal.        Judgment: Judgment normal.    BP 118/71 (BP Location: Right Arm, Patient Position: Sitting, Cuff Size: Large)   Pulse 76   Temp 97.8 F (36.6 C) (Oral)   Resp 16   Ht _0  (1.6 m)   Wt 165 lb (74.8 kg)   SpO2 98%   BMI 29.23 kg/m  Wt Readings from Last 3 Encounters:  03/15/21 165 lb (74.8 kg)  03/08/21 164 lb 14.4 oz (74.8 kg)  01/11/21 162 lb (73.5 kg)     Health Maintenance Due  Topic Date Due   Pneumococcal Vaccine 49-62 Years old (1 - PCV) Never done   Hepatitis C Screening  Never done   TETANUS/TDAP  Never done   OPHTHALMOLOGY EXAM  02/20/2013   COVID-19 Vaccine (3 - Booster for Pfizer series) 09/30/2019    There are no preventive care reminders to display for this patient.  Lab Results  Component Value Date   TSH 1.200 05/20/2018   Lab Results  Component Value Date   WBC 5.3 08/31/2020   HGB 15.2 08/31/2020   HCT 46.0 08/31/2020   MCV 90 08/31/2020   PLT 237 08/31/2020   Lab Results  Component Value Date   NA 141 08/31/2020   K 4.5 08/31/2020   CO2 22 08/31/2020   GLUCOSE 201 (H) 08/31/2020   BUN 16 08/31/2020   CREATININE 0.66 (L) 08/31/2020   BILITOT 0.5 08/31/2020   ALKPHOS 83 08/31/2020   AST 19 08/31/2020   ALT 33 08/31/2020   PROT 7.1 08/31/2020   ALBUMIN 4.6 08/31/2020   CALCIUM 8.8 08/31/2020   EGFR 116 08/31/2020   Lab Results  Component Value Date   CHOL 130 08/31/2020   Lab Results  Component Value Date   HDL 46 08/31/2020   Lab Results  Component Value Date   LDLCALC 70 08/31/2020   Lab Results  Component Value Date   TRIG 66 08/31/2020   Lab Results  Component Value Date    CHOLHDL 2.8 08/31/2020   Lab Results  Component Value Date  HGBA1C 6.7 (H) 03/08/2021      Assessment & Plan:     1. Diabetes mellitus without complication ([place]) - His diabetes is improving, his A1c at 6.7%. He was encouraged to continue on current medication, low carb/non concentrated sweet diet. He should check blood glucose daily, record and bring log to follow up appointment. - POCT Glucose (CBG); Future - POCT Glucose (CBG)  2. Essential hypertension - His blood pressure is under control, he will continue current medication, DASH diet and exercise as tolerated.     Follow-up: Return in about 3 months (around 06/13/2021), or if symptoms worsen or fail to improve.    Roby Spalla Jerold Coombe, NP

## 2021-06-13 ENCOUNTER — Ambulatory Visit: Payer: Self-pay | Admitting: Gerontology

## 2021-08-07 ENCOUNTER — Other Ambulatory Visit: Payer: Self-pay | Admitting: Gerontology

## 2021-08-07 DIAGNOSIS — E785 Hyperlipidemia, unspecified: Secondary | ICD-10-CM

## 2021-08-07 DIAGNOSIS — E119 Type 2 diabetes mellitus without complications: Secondary | ICD-10-CM

## 2021-08-07 DIAGNOSIS — I10 Essential (primary) hypertension: Secondary | ICD-10-CM

## 2021-08-30 ENCOUNTER — Ambulatory Visit: Payer: Self-pay | Admitting: Gerontology

## 2021-09-07 ENCOUNTER — Encounter: Payer: Self-pay | Admitting: Gerontology

## 2021-09-07 ENCOUNTER — Other Ambulatory Visit: Payer: Self-pay

## 2021-09-07 ENCOUNTER — Ambulatory Visit: Payer: Self-pay | Admitting: Gerontology

## 2021-09-07 VITALS — BP 118/73 | HR 88 | Temp 98.0°F | Resp 16 | Ht 64.0 in | Wt 167.8 lb

## 2021-09-07 DIAGNOSIS — I10 Essential (primary) hypertension: Secondary | ICD-10-CM

## 2021-09-07 DIAGNOSIS — E119 Type 2 diabetes mellitus without complications: Secondary | ICD-10-CM

## 2021-09-07 DIAGNOSIS — E785 Hyperlipidemia, unspecified: Secondary | ICD-10-CM

## 2021-09-07 LAB — POCT GLYCOSYLATED HEMOGLOBIN (HGB A1C): Hemoglobin A1C: 9.2 % — AB (ref 4.0–5.6)

## 2021-09-07 LAB — GLUCOSE, POCT (MANUAL RESULT ENTRY): POC Glucose: 184 mg/dl — AB (ref 70–99)

## 2021-09-07 MED ORDER — GLIPIZIDE ER 10 MG PO TB24
10.0000 mg | ORAL_TABLET | Freq: Every day | ORAL | 3 refills | Status: DC
Start: 2021-09-07 — End: 2021-12-14

## 2021-09-07 MED ORDER — METFORMIN HCL 1000 MG PO TABS: ORAL_TABLET | ORAL | 3 refills | Status: DC

## 2021-09-07 MED ORDER — ATORVASTATIN CALCIUM 40 MG PO TABS: 40.0000 mg | ORAL_TABLET | Freq: Every day | ORAL | 3 refills | Status: DC

## 2021-09-07 MED ORDER — CARVEDILOL 12.5 MG PO TABS: ORAL_TABLET | ORAL | 3 refills | Status: DC

## 2021-09-07 NOTE — Progress Notes (Signed)
Established Patient Office Visit  Subjective   Patient ID: Hector Weber, male    DOB: 01-17-1971  Age: 51 y.o. MRN: [id number]  Chief Complaint  Patient presents with   Follow-up   Diabetes    HPI  Hector Weber  is a 51 y/o male who has history of Anemia, Arthritis, CKD, T2DM, Hypertension, Hyperlipidemia presents for routine follow up and lab review. His HgbA1c checked during visit increased from 6.7% to 9.2% and blood glucose was 184 mg/dl.  He reports that he checks his blood glucose 3 times weekly,denies hypo/hyperglycemia, peripheral neuropathy and performs daily foot checks.  [name] noncompliant with his medications and follow-up appointment.  Overall, he states that he is doing well and offers no further complaint.  Review of Systems  Constitutional: Negative.   Respiratory: Negative.    Cardiovascular: Negative.   Skin: Negative.   Neurological: Negative.   Endo/Heme/Allergies: Negative.   Psychiatric/Behavioral: Negative.       Objective:     BP 118/73 (BP Location: Left Arm, Patient Position: Sitting, Cuff Size: Large)   Pulse 88   Temp 98 F (36.7 C) (Oral)   Resp 16   Ht '5\' 4"'  (1.626 m)   Wt 167 lb 12.8 oz (76.1 kg)   SpO2 95%   BMI 28.80 kg/m  BP Readings from Last 3 Encounters:  09/07/21 118/73  03/15/21 118/71  03/08/21 131/81   Wt Readings from Last 3 Encounters:  09/07/21 167 lb 12.8 oz (76.1 kg)  03/15/21 165 lb (74.8 kg)  03/08/21 164 lb 14.4 oz (74.8 kg)      Physical Exam HENT:     Head: Normocephalic and atraumatic.     Mouth/Throat:     Mouth: Mucous membranes are moist.  Eyes:     Extraocular Movements: Extraocular movements intact.     Conjunctiva/sclera: Conjunctivae normal.     Pupils: Pupils are equal, round, and reactive to light.  Cardiovascular:     Rate and Rhythm: Normal rate and regular rhythm.     Pulses: Normal pulses.     Heart sounds: Normal heart sounds.  Pulmonary:     Effort: Pulmonary effort is  normal.     Breath sounds: Normal breath sounds.  Skin:    General: Skin is warm.  Neurological:     General: No focal deficit present.     Mental Status: [name] alert and oriented to person, place, and time. Mental status is at baseline.  Psychiatric:        Mood and Affect: Mood normal.        Behavior: Behavior normal.        Thought Content: Thought content normal.        Judgment: Judgment normal.     Results for orders placed or performed in visit on 09/07/21  POCT Glucose (CBG)  Result Value Ref Range   POC Glucose 184 (A) 70 - 99 mg/dl  POCT HgB A1C  Result Value Ref Range   Hemoglobin A1C 9.2 (A) 4.0 - 5.6 %   HbA1c POC (<> result, manual entry)     HbA1c, POC (prediabetic range)     HbA1c, POC (controlled diabetic range)      Last CBC Lab Results  Component Value Date   WBC 5.3 08/31/2020   HGB 15.2 08/31/2020   HCT 46.0 08/31/2020   MCV 90 08/31/2020   MCH 29.7 08/31/2020   RDW 11.9 08/31/2020   PLT 237 08/31/2020  Last metabolic panel Lab Results  Component Value Date   GLUCOSE 201 (H) 08/31/2020   NA 141 08/31/2020   K 4.5 08/31/2020   CL 104 08/31/2020   CO2 22 08/31/2020   BUN 16 08/31/2020   CREATININE 0.66 (L) 08/31/2020   EGFR 116 08/31/2020   CALCIUM 8.8 08/31/2020   PROT 7.1 08/31/2020   ALBUMIN 4.6 08/31/2020   LABGLOB 2.5 08/31/2020   AGRATIO 1.8 08/31/2020   BILITOT 0.5 08/31/2020   ALKPHOS 83 08/31/2020   AST 19 08/31/2020   ALT 33 08/31/2020   Last lipids Lab Results  Component Value Date   CHOL 130 08/31/2020   HDL 46 08/31/2020   LDLCALC 70 08/31/2020   TRIG 66 08/31/2020   CHOLHDL 2.8 08/31/2020   Last hemoglobin A1c Lab Results  Component Value Date   HGBA1C 9.2 (A) 09/07/2021      The 10-year ASCVD risk score (Arnett DK, et al., 2019) is: 8.1%    Assessment & Plan:   1. Diabetes mellitus without complication ([place]) - His diabetes is not under control, his HgbA1c was 9.2% and his goal should be less than 7%.  He was strongly encouraged to check at least his fasting blood glucose daily, record and bring log to follow up appointment. His goal should be between 80-130 mg/dl. He was advised to continue on low carb/non concentrated sweet diet and exercise as tolerated. - POCT HgB A1C; Future - POCT Glucose (CBG); Future - Urine Microalbumin w/creat. ratio; Future - glipiZIDE (GLUCOTROL XL) 10 MG 24 hr tablet; Take 1 tablet (10 mg total) by mouth daily with breakfast.  Dispense: 30 tablet; Refill: 3 - metFORMIN (GLUCOPHAGE) 1000 MG tablet; TAKE 1 TABLET BY MOUTH TWICE A DAY WITH A MEAL  Dispense: 60 tablet; Refill: 3 - POCT Glucose (CBG) - POCT HgB A1C - Urine Microalbumin w/creat. ratio  2. Essential hypertension - His blood pressure is under control, he will continue on current medication, DASH diet and exercise as tolerated. - CBC w/Diff; Future - Comp Met (CMET); Future - carvedilol (COREG) 12.5 MG tablet; Take 12.5 mg twice daily  Dispense: 60 tablet; Refill: 3 - Comp Met (CMET) - CBC w/Diff  3. Hyperlipidemia LDL goal <70 - He will continue on current medication, low fat/ cholesterol diet. - Lipid panel; Future - atorvastatin (LIPITOR) 40 MG tablet; Take 1 tablet (40 mg total) by mouth daily.  Dispense: 30 tablet; Refill: 3 - Lipid panel    Return in about 5 weeks (around 10/11/2021), or if symptoms worsen or fail to improve.    Hector Kham Jerold Coombe, NP

## 2021-09-07 NOTE — Patient Instructions (Signed)
Carbohydrate Counting for Diabetes Mellitus, Adult Carbohydrate counting is a method of keeping track of how many carbohydrates you eat. Eating carbohydrates increases the amount of sugar (glucose) in the blood. Counting how many carbohydrates you eat improves how well you manage your blood glucose. This, in turn, helps you manage your diabetes. Carbohydrates are measured in grams (g) per serving. It is important to know how many carbohydrates (in grams or by serving size) you can have in each meal. This is different for every person. A dietitian can help you make a meal plan and calculate how many carbohydrates you should have at each meal and snack. What foods contain carbohydrates? Carbohydrates are found in the following foods: Grains, such as breads and cereals. Dried beans and soy products. Starchy vegetables, such as potatoes, peas, and corn. Fruit and fruit juices. Milk and yogurt. Sweets and snack foods, such as cake, cookies, candy, chips, and soft drinks. How do I count carbohydrates in foods? There are two ways to count carbohydrates in food. You can read food labels or learn standard serving sizes of foods. You can use either of these methods or a combination of both. Using the Nutrition Facts label The Nutrition Facts list is included on the labels of almost all packaged foods and beverages in the [place]. It includes: The serving size. Information about nutrients in each serving, including the grams of carbohydrate per serving. To use the Nutrition Facts, decide how many servings you will have. Then, multiply the number of servings by the number of carbohydrates per serving. The resulting number is the total grams of carbohydrates that you will be having. Learning the standard serving sizes of foods When you eat carbohydrate foods that are not packaged or do not include Nutrition Facts on the label, you need to measure the servings in order to count the grams of  carbohydrates. Measure the foods that you will eat with a food scale or measuring cup, if needed. Decide how many standard-size servings you will eat. Multiply the number of servings by 15. For foods that contain carbohydrates, one serving equals 15 g of carbohydrates. For example, if you eat 2 cups or 10 oz (300 g) of strawberries, you will have eaten 2 servings and 30 g of carbohydrates (2 servings x 15 g = 30 g). For foods that have more than one food mixed, such as soups and casseroles, you must count the carbohydrates in each food that is included. The following list contains standard serving sizes of common carbohydrate-rich foods. Each of these servings has about 15 g of carbohydrates: 1 slice of bread. 1 six-inch (15 cm) tortilla. ? cup or 2 oz (53 g) cooked rice or pasta.  cup or 3 oz (85 g) cooked or canned, drained and rinsed beans or lentils.  cup or 3 oz (85 g) starchy vegetable, such as peas, corn, or squash.  cup or 4 oz (120 g) hot cereal.  cup or 3 oz (85 g) boiled or mashed potatoes, or  or 3 oz (85 g) of a large baked potato.  cup or 4 fl oz (118 mL) fruit juice. 1 cup or 8 fl oz (237 mL) milk. 1 small or 4 oz (106 g) apple.  or 2 oz (63 g) of a medium banana. 1 cup or 5 oz (150 g) strawberries. 3 cups or 1 oz (28.3 g) popped popcorn. What is an example of carbohydrate counting? To calculate the grams of carbohydrates in this sample meal, follow the steps  shown below. Sample meal 3 oz (85 g) chicken breast. ? cup or 4 oz (106 g) brown rice.  cup or 3 oz (85 g) corn. 1 cup or 8 fl oz (237 mL) milk. 1 cup or 5 oz (150 g) strawberries with sugar-free whipped topping. Carbohydrate calculation Identify the foods that contain carbohydrates: Rice. Corn. Milk. Strawberries. Calculate how many servings you have of each food: 2 servings rice. 1 serving corn. 1 serving milk. 1 serving strawberries. Multiply each number of servings by 15 g: 2 servings rice x 15  g = 30 g. 1 serving corn x 15 g = 15 g. 1 serving milk x 15 g = 15 g. 1 serving strawberries x 15 g = 15 g. Add together all of the amounts to find the total grams of carbohydrates eaten: 30 g + 15 g + 15 g + 15 g = 75 g of carbohydrates total. What are tips for following this plan? Shopping Develop a meal plan and then make a shopping list. Buy fresh and frozen vegetables, fresh and frozen fruit, dairy, eggs, beans, lentils, and whole grains. Look at food labels. Choose foods that have more fiber and less sugar. Avoid processed foods and foods with added sugars. Meal planning Aim to have the same number of grams of carbohydrates at each meal and for each snack time. Plan to have regular, balanced meals and snacks. Where to find more information American Diabetes Association: diabetes.org Centers for Disease Control and Prevention: TonerPromos.no Academy of Nutrition and Dietetics: eatright.org Association of Diabetes Care & Education Specialists: diabeteseducator.org Summary Carbohydrate counting is a method of keeping track of how many carbohydrates you eat. Eating carbohydrates increases the amount of sugar (glucose) in your blood. Counting how many carbohydrates you eat improves how well you manage your blood glucose. This helps you manage your diabetes. A dietitian can help you make a meal plan and calculate how many carbohydrates you should have at each meal and snack. This information is not intended to replace advice given to you by your health care provider. Make sure you discuss any questions you have with your health care provider. Document Revised: 10/28/2019 Document Reviewed: 10/28/2019 Elsevier Patient Education  2023 Elsevier Inc. Recuento de carbohidratos para la diabetes mellitus en los adultos Carbohydrate Counting for Diabetes Mellitus, Adult El recuento de carbohidratos es un mtodo para llevar un registro de la cantidad de carbohidratos que se ingieren. La ingesta de  carbohidratos aumenta la cantidad de azcar (glucosa) en la sangre. El recuento de la cantidad de carbohidratos que ingiere mejora el control de su glucemia. Esto, a su vez, le ayuda a controlar su diabetes. Los carbohidratos se miden en gramos (g) por porcin. Es importante saber la cantidad de carbohidratos (en gramos o por tamao de porcin) que se puede ingerir en cada comida. Esto es Government social research officer. Un nutricionista puede ayudarlo a crear un plan de alimentacin y a calcular la cantidad de carbohidratos que debe ingerir en cada comida y colacin. Qu alimentos contienen carbohidratos? Los siguientes alimentos incluyen carbohidratos: Granos, como panes y cereales. Frijoles secos y productos con soja. Verduras con almidn, como papas, guisantes y maz. Nils Pyle y jugos de frutas. Leche y Dentist. Dulces y colaciones, como pasteles, galletas, caramelos, papas fritas de bolsa y refrescos. Cmo se calculan los carbohidratos de los alimentos? Hay dos maneras de calcular los carbohidratos de los alimentos. Puede leer las etiquetas de los alimentos o aprender cules son los tamaos de las porciones estndar de  los alimentos. Puede usar cualquiera de estos mtodos o una combinacin de [place]. Usar la Air cabin crewetiqueta de informacin nutricional La lista de Informacin nutricional est incluida en las etiquetas de casi todas las bebidas y los alimentos envasados de los [place]. Esto incluye lo siguiente: El tamao de la porcin. Informacin sobre los nutrientes de cada porcin, incluidos los gramos de carbohidratos por porcin. Para usar la Informacin nutricional, decida cuntas porciones tomar. Luego, multiplique el nmero de porciones por la cantidad de carbohidratos por porcin. El nmero resultante es la cantidad total de carbohidratos que comer. Conocer los tamaos de las porciones estndar de los alimentos Cuando coma alimentos que contengan carbohidratos y que no estn envasados o no  incluyan la informacin nutricional en la etiqueta, debe medir las porciones para poder calcular los gramos de carbohidratos. Mida los alimentos que comer con una balanza de alimentos o una taza medidora, si es necesario. Decida cuntas porciones de Programmer, systemstamao estndar comer. Multiplique el nmero de porciones por 15. En los alimentos que contienen carbohidratos, una porcin [place] a 15 g de carbohidratos. Por ejemplo, si come 2 tazas o 10 onzas (300 g) de fresas, habr comido 2 porciones y 30 g de carbohidratos (2 porciones x 15 g = 30 g). En el caso de las comidas que contienen mezclas de ms de un alimento, como las sopas y los guisos, debe calcular los carbohidratos de cada alimento que se incluye. La siguiente lista contiene los tamaos de porciones estndar de los alimentos ricos en carbohidratos ms comunes. Cada una de estas porciones tiene aproximadamente 15 g de carbohidratos: 1 rebanada de pan. 1 tortilla de seis pulgadas (15 cm). ? de taza o 2 onzas (53 g) de arroz o pasta cocidos.  taza o 3 onzas (85 g) de lentejas o frijoles cocidos o enlatados, escurridos y enjuagados.  taza o 3 onzas (85 g) de verduras con almidn, como guisantes, maz o zapallo.  taza o 4 onzas (120 g) de cereal caliente.  taza o 3 onzas (85 g) de papas hervidas o en pur, o  o 3 onzas (85 g) de una papa grande al horno.  taza o 4 onzas fluidas (118 ml) de jugo de frutas. 1 taza u 8 onzas fluidas (237 ml) de leche. 1 unidad pequea o 4 onzas (106 g) de manzana.  unidad o 2 onzas (63 g) de una banana mediana. 1 taza o 5 oz (150 g) de fresas. 3 tazas o 1 oz (28.3 g) de palomitas de maz. Cul sera un ejemplo de recuento de carbohidratos? Para calcular los gramos de carbohidratos de este ejemplo de comida, siga los pasos que se describen a continuacin. Ejemplo de comida 3 onzas (85 g) de pechugas de pollo. ? de taza o 4 onzas (106 g) de arroz integral.  taza o 3 onzas (85 g) de maz. 1 taza u 8  onzas fluidas (237 ml) de leche. 1 taza o 5 onzas (150 g) de fresas con crema batida sin azcar. Clculo de carbohidratos Identifique los alimentos que contienen carbohidratos: Arroz. Maz. Leche. Jinny SandersFresas. Calcule cuntas porciones come de cada alimento: 2 porciones de arroz. 1 porcin de maz. 1 porcin de leche. 1 porcin de fresas. Multiplique cada nmero de porciones por 15 g: 2 porciones de arroz x 15 g = 30 g. 1 porcin de maz x 15 g = 15 g. 1 porcin de leche x 15 g = 15 g. 1 porcin de fresas x 15 g = 15 g. Sume todas las cantidades  para conocer el total de gramos de carbohidratos consumidos: 30 g + 15 g + 15 g + 15 g = 75 g de carbohidratos en total. Consejos para seguir este plan Al ir de compras [place] un plan de comidas y luego haga una lista de compras. Compre verduras frescas y congeladas, frutas frescas y congeladas, productos lcteos, huevos, frijoles, lentejas y cereales integrales. Fjese en las etiquetas de los alimentos. Elija los alimentos que tengan ms fibra y Neurosurgeon. Evite los alimentos procesados y los alimentos con Biochemist, clinical. Planificacin de las comidas Trate de consumir la misma cantidad de gramos de carbohidratos en cada comida y en cada colacin. Planifique tomar comidas y colaciones regulares y equilibradas. Dnde buscar ms informacin American Diabetes Association (Asociacin Estadounidense de la Diabetes): diabetes.org Centers for Disease Control and Prevention (Centros para el Control y la Prevencin de [place]): TonerPromos.no Academy of Nutrition and Dietetics (Academia de Nutricin y Pension scheme manager): Theme [email or web address] Association of Diabetes Care & Education Specialists (Asociacin de Especialistas en Atencin y Educacin sobre la Diabetes): diabeteseducator.org Resumen El recuento de carbohidratos es un mtodo para llevar un registro de la cantidad de carbohidratos que se ingieren. La ingesta de carbohidratos aumenta la cantidad de azcar  (glucosa) en la sangre. El recuento de la cantidad de carbohidratos que ingiere mejora el control de su glucemia. Esto le ayuda a Chief Operating Officer su diabetes. Un nutricionista puede ayudarlo a crear un plan de alimentacin y a calcular la cantidad de carbohidratos que debe ingerir en cada comida y colacin. Esta informacin no tiene Theme park manager el consejo del mdico. Asegrese de hacerle al mdico cualquier pregunta que tenga. Document Revised: 12/15/2019 Document Reviewed: 12/15/2019 Elsevier Patient Education  2023 [employer].

## 2021-09-08 LAB — MICROALBUMIN / CREATININE URINE RATIO
Creatinine, Urine: 134.3 mg/dL
Microalb/Creat Ratio: 3 mg/g creat (ref 0–29)
Microalbumin, Urine: 4.3 ug/mL

## 2021-09-08 LAB — CBC WITH DIFFERENTIAL/PLATELET
Basophils Absolute: 0 10*3/uL (ref 0.0–0.2)
Basos: 1 %
EOS (ABSOLUTE): 0.1 10*3/uL (ref 0.0–0.4)
Eos: 3 %
Hematocrit: 47.8 % (ref 37.5–51.0)
Hemoglobin: 16.2 g/dL (ref 13.0–17.7)
Immature Grans (Abs): 0 10*3/uL (ref 0.0–0.1)
Immature Granulocytes: 0 %
Lymphocytes Absolute: 1.5 10*3/uL (ref 0.7–3.1)
Lymphs: 28 %
MCH: 31.1 pg (ref 26.6–33.0)
MCHC: 33.9 g/dL (ref 31.5–35.7)
MCV: 92 fL (ref 79–97)
Monocytes Absolute: 0.5 10*3/uL (ref 0.1–0.9)
Monocytes: 9 %
Neutrophils Absolute: 3 10*3/uL (ref 1.4–7.0)
Neutrophils: 59 %
Platelets: 220 10*3/uL (ref 150–450)
RBC: 5.21 x10E6/uL (ref 4.14–5.80)
RDW: 12.4 % (ref 11.6–15.4)
WBC: 5.1 10*3/uL (ref 3.4–10.8)

## 2021-09-08 LAB — COMPREHENSIVE METABOLIC PANEL
ALT: 30 IU/L (ref 0–44)
AST: 15 IU/L (ref 0–40)
Albumin/Globulin Ratio: 1.8 (ref 1.2–2.2)
Albumin: 4.8 g/dL (ref 4.0–5.0)
Alkaline Phosphatase: 112 IU/L (ref 44–121)
BUN/Creatinine Ratio: 27 — ABNORMAL HIGH (ref 9–20)
BUN: 17 mg/dL (ref 6–24)
Bilirubin Total: 0.4 mg/dL (ref 0.0–1.2)
CO2: 24 mmol/L (ref 20–29)
Calcium: 9.5 mg/dL (ref 8.7–10.2)
Chloride: 100 mmol/L (ref 96–106)
Creatinine, Ser: 0.62 mg/dL — ABNORMAL LOW (ref 0.76–1.27)
Globulin, Total: 2.6 g/dL (ref 1.5–4.5)
Glucose: 238 mg/dL — ABNORMAL HIGH (ref 70–99)
Potassium: 4.1 mmol/L (ref 3.5–5.2)
Sodium: 139 mmol/L (ref 134–144)
Total Protein: 7.4 g/dL (ref 6.0–8.5)
eGFR: 117 mL/min/{1.73_m2} (ref >59)

## 2021-09-08 LAB — LIPID PANEL
Chol/HDL Ratio: 4.5 ratio (ref 0.0–5.0)
Cholesterol, Total: 152 mg/dL (ref 100–199)
HDL: 34 mg/dL — ABNORMAL LOW (ref >39)
LDL Chol Calc (NIH): 64 mg/dL (ref 0–99)
Triglycerides: 343 mg/dL — ABNORMAL HIGH (ref 0–149)
VLDL Cholesterol Cal: 54 mg/dL — ABNORMAL HIGH (ref 5–40)

## 2021-10-11 ENCOUNTER — Encounter: Payer: Self-pay | Admitting: Gerontology

## 2021-10-11 ENCOUNTER — Ambulatory Visit: Payer: Self-pay | Admitting: Gerontology

## 2021-10-11 VITALS — BP 138/84 | HR 82 | Temp 98.4°F | Resp 16 | Wt 164.5 lb

## 2021-10-11 DIAGNOSIS — E119 Type 2 diabetes mellitus without complications: Secondary | ICD-10-CM

## 2021-10-11 DIAGNOSIS — K0889 Other specified disorders of teeth and supporting structures: Secondary | ICD-10-CM

## 2021-10-11 DIAGNOSIS — H6123 Impacted cerumen, bilateral: Secondary | ICD-10-CM

## 2021-10-11 MED ORDER — IBUPROFEN 600 MG PO TABS: 600.0000 mg | ORAL_TABLET | Freq: Two times a day (BID) | ORAL | 0 refills | Status: DC

## 2021-10-11 MED ORDER — DEBROX 6.5 % OT SOLN: 5.0000 [drp] | Freq: Two times a day (BID) | OTIC | 0 refills | Status: DC

## 2021-10-11 NOTE — Progress Notes (Signed)
Established Patient Office Visit  Subjective   Patient ID: Hector Weber, male    DOB: 1971/01/08  Age: 52 y.o. MRN: [id number]  No chief complaint on file.   HPI Hector Weber  is a 51 y/o male who has history of Anemia, Arthritis, CKD, T2DM, Hypertension, Hyperlipidemia presents for routine follow up and lab review. His HgbA1c checked during visit increased from 6.7% to 9.2%. He reports that he checks his blood glucose bid, he brought his glucometer and his readings ranges between 146-190 mg/dl. He denies hypo/hyperglycemic symptoms, peripheral neuropathy and performs daily foot checks. He also c/o ear pain, to bilateral ear, left mastoid pain that started a week ago. He denies discharge , hearing loss , fever and chills. He also c/o intermittent pain to right upper  wisdom teeth that has being going on for 2 months. He denies swelling, erythema, fever and chills. Overall, he states that he's doing well and offers no further complaint.   Review of Systems  Constitutional: Negative.   HENT:  Positive for ear pain.   Eyes: Negative.   Respiratory: Negative.    Cardiovascular: Negative.   Neurological: Negative.   Endo/Heme/Allergies: Negative.       Objective:     BP 138/84 (BP Location: Right Arm, Patient Position: Sitting, Cuff Size: Large)   Pulse 82   Temp 98.4 F (36.9 C)   Resp 16   Wt 164 lb 8 oz (74.6 kg)   SpO2 95%   BMI 28.24 kg/m  BP Readings from Last 3 Encounters:  10/11/21 138/84  09/07/21 118/73  03/15/21 118/71   Wt Readings from Last 3 Encounters:  10/11/21 164 lb 8 oz (74.6 kg)  09/07/21 167 lb 12.8 oz (76.1 kg)  03/15/21 165 lb (74.8 kg)      Physical Exam HENT:     Head: Normocephalic.     Right Ear: Hearing normal. There is impacted cerumen.     Left Ear: Hearing normal. There is impacted cerumen. There is mastoid tenderness.     Mouth/Throat:     Mouth: Mucous membranes are moist.     Dentition: Gingival swelling and dental  caries present. No dental abscesses.  Cardiovascular:     Rate and Rhythm: Normal rate and regular rhythm.     Pulses: Normal pulses.     Heart sounds: Normal heart sounds.  Pulmonary:     Effort: Pulmonary effort is normal.     Breath sounds: Normal breath sounds.  Skin:    General: Skin is warm.  Neurological:     General: No focal deficit present.     Mental Status: [name] alert and oriented to person, place, and time. Mental status is at baseline.  Psychiatric:        Mood and Affect: Mood normal.        Behavior: Behavior normal.        Thought Content: Thought content normal.        Judgment: Judgment normal.      No results found for any visits on 10/11/21.  Last CBC Lab Results  Component Value Date   WBC 5.1 09/07/2021   HGB 16.2 09/07/2021   HCT 47.8 09/07/2021   MCV 92 09/07/2021   MCH 31.1 09/07/2021   RDW 12.4 09/07/2021   PLT 220 17/51/0258   Last metabolic panel Lab Results  Component Value Date   GLUCOSE 238 (H) 09/07/2021   NA 139 09/07/2021   K 4.1 09/07/2021  CL 100 09/07/2021   CO2 24 09/07/2021   BUN 17 09/07/2021   CREATININE 0.62 (L) 09/07/2021   EGFR 117 09/07/2021   CALCIUM 9.5 09/07/2021   PROT 7.4 09/07/2021   ALBUMIN 4.8 09/07/2021   LABGLOB 2.6 09/07/2021   AGRATIO 1.8 09/07/2021   BILITOT 0.4 09/07/2021   ALKPHOS 112 09/07/2021   AST 15 09/07/2021   ALT 30 09/07/2021   Last lipids Lab Results  Component Value Date   CHOL 152 09/07/2021   HDL 34 (L) 09/07/2021   LDLCALC 64 09/07/2021   TRIG 343 (H) 09/07/2021   CHOLHDL 4.5 09/07/2021   Last hemoglobin A1c Lab Results  Component Value Date   HGBA1C 9.2 (A) 09/07/2021      The 10-year ASCVD risk score (Arnett DK, et al., 2019) is: 17.9%    Assessment & Plan:   1. Diabetes mellitus without complication ([place]) - His HgbA1c was 9.2%, he will continue on current medication, check blood glucose bid, record and bring log during appointment. He was advised to continue on  low carb/no concentrated sweet diet. - Urine Microalbumin w/creat. ratio  2. Bilateral impacted cerumen - Cerumen impaction to bilateral ear, was started on Debrox, educated side effects and advised to notify clinic. He will follow up next week, if no improvement will likely perform ear lavage. - carbamide peroxide (DEBROX) 6.5 % OTIC solution; Place 5 drops into both ears 2 (two) times daily.  Dispense: 15 mL; Refill: 0  3. Tooth ache - Observed some decay to right upper wisdom tooth, swollen around tooth, no erythema nor abscess. He was started on ibuprofen 600 mg bid, educated on medication side effects and advised to notify clinic. He was provided with dental application and referral. He was advised to go to the ED for worsening symptoms. - ibuprofen (ADVIL) 600 MG tablet; Take 1 tablet (600 mg total) by mouth 2 (two) times daily.  Dispense: 20 tablet; Refill: 0    Return in about 6 days (around 10/17/2021), or if symptoms worsen or fail to improve.    Reiko Vinje Jerold Coombe, NP

## 2021-10-11 NOTE — Patient Instructions (Signed)
Recuento de carbohidratos para la diabetes mellitus en los adultos Carbohydrate Counting for Diabetes Mellitus, Adult El recuento de carbohidratos es un mtodo para llevar un registro de la cantidad de carbohidratos que se ingieren. La ingesta de carbohidratos aumenta la cantidad de azcar (glucosa) en la sangre. El recuento de la cantidad de carbohidratos que ingiere mejora el control de su glucemia. Esto, a su vez, le ayuda a controlar su diabetes. Los carbohidratos se miden en gramos (g) por porcin. Es importante saber la cantidad de carbohidratos (en gramos o por tamao de porcin) que se puede ingerir en cada comida. Esto es diferente en cada persona. Un nutricionista puede ayudarlo a crear un plan de alimentacin y a calcular la cantidad de carbohidratos que debe ingerir en cada comida y colacin. Qu alimentos contienen carbohidratos? Los siguientes alimentos incluyen carbohidratos: Granos, como panes y cereales. Frijoles secos y productos con soja. Verduras con almidn, como papas, guisantes y maz. Frutas y jugos de frutas. Leche y yogur. Dulces y colaciones, como pasteles, galletas, caramelos, papas fritas de bolsa y refrescos. Cmo se calculan los carbohidratos de los alimentos? Hay dos maneras de calcular los carbohidratos de los alimentos. Puede leer las etiquetas de los alimentos o aprender cules son los tamaos de las porciones estndar de los alimentos. Puede usar cualquiera de estos mtodos o una combinacin de ambos. Usar la etiqueta de informacin nutricional La lista de Informacin nutricional est incluida en las etiquetas de casi todas las bebidas y los alimentos envasados de los [place]. Esto incluye lo siguiente: El tamao de la porcin. Informacin sobre los nutrientes de cada porcin, incluidos los gramos de carbohidratos por porcin. Para usar la Informacin nutricional, decida cuntas porciones tomar. Luego, multiplique el nmero de porciones por la cantidad  de carbohidratos por porcin. El nmero resultante es la cantidad total de carbohidratos que comer. Conocer los tamaos de las porciones estndar de los alimentos Cuando coma alimentos que contengan carbohidratos y que no estn envasados o no incluyan la informacin nutricional en la etiqueta, debe medir las porciones para poder calcular los gramos de carbohidratos. Mida los alimentos que comer con una balanza de alimentos o una taza medidora, si es necesario. Decida cuntas porciones de tamao estndar comer. Multiplique el nmero de porciones por 15. En los alimentos que contienen carbohidratos, una porcin equivale a 15 g de carbohidratos. Por ejemplo, si come 2 tazas o 10 onzas (300 g) de fresas, habr comido 2 porciones y 30 g de carbohidratos (2 porciones x 15 g = 30 g). En el caso de las comidas que contienen mezclas de ms de un alimento, como las sopas y los guisos, debe calcular los carbohidratos de cada alimento que se incluye. La siguiente lista contiene los tamaos de porciones estndar de los alimentos ricos en carbohidratos ms comunes. Cada una de estas porciones tiene aproximadamente 15 g de carbohidratos: 1 rebanada de pan. 1 tortilla de seis pulgadas (15 cm). ? de taza o 2 onzas (53 g) de arroz o pasta cocidos.  taza o 3 onzas (85 g) de lentejas o frijoles cocidos o enlatados, escurridos y enjuagados.  taza o 3 onzas (85 g) de verduras con almidn, como guisantes, maz o zapallo.  taza o 4 onzas (120 g) de cereal caliente.  taza o 3 onzas (85 g) de papas hervidas o en pur, o  o 3 onzas (85 g) de una papa grande al horno.  taza o 4 onzas fluidas (118 ml) de jugo de frutas. 1 taza u   8 onzas fluidas (237 ml) de leche. 1 unidad pequea o 4 onzas (106 g) de manzana.  unidad o 2 onzas (63 g) de una banana mediana. 1 taza o 5 oz (150 g) de fresas. 3 tazas o 1 oz (28.3 g) de palomitas de maz. Cul sera un ejemplo de recuento de carbohidratos? Para calcular los gramos  de carbohidratos de este ejemplo de comida, siga los pasos que se describen a continuacin. Ejemplo de comida 3 onzas (85 g) de pechugas de pollo. ? de taza o 4 onzas (106 g) de arroz integral.  taza o 3 onzas (85 g) de maz. 1 taza u 8 onzas fluidas (237 ml) de leche. 1 taza o 5 onzas (150 g) de fresas con crema batida sin azcar. Clculo de carbohidratos Identifique los alimentos que contienen carbohidratos: Arroz. Maz. Leche. Fresas. Calcule cuntas porciones come de cada alimento: 2 porciones de arroz. 1 porcin de maz. 1 porcin de leche. 1 porcin de fresas. Multiplique cada nmero de porciones por 15 g: 2 porciones de arroz x 15 g = 30 g. 1 porcin de maz x 15 g = 15 g. 1 porcin de leche x 15 g = 15 g. 1 porcin de fresas x 15 g = 15 g. Sume todas las cantidades para conocer el total de gramos de carbohidratos consumidos: 30 g + 15 g + 15 g + 15 g = 75 g de carbohidratos en total. Consejos para seguir este plan Al ir de compras El[employer]ore un plan de comidas y luego haga una lista de compras. Compre verduras frescas y congeladas, frutas frescas y congeladas, productos lcteos, huevos, frijoles, lentejas y cereales integrales. Fjese en las etiquetas de los alimentos. Elija los alimentos que tengan ms fibra y menos azcar. Evite los alimentos procesados y los alimentos con azcares agregados. Planificacin de las comidas Trate de consumir la misma cantidad de gramos de carbohidratos en cada comida y en cada colacin. Planifique tomar comidas y colaciones regulares y equilibradas. Dnde buscar ms informacin American Diabetes Association (Asociacin Estadounidense de la Diabetes): diabetes.org Centers for Disease Control and Prevention (Centros para el Control y la Prevencin de [place]): cdc.gov Academy of Nutrition and Dietetics (Academia de Nutricin y Diettica): eatright.org Association of Diabetes Care & Education Specialists (Asociacin de Especialistas en  Atencin y Educacin sobre la Diabetes): diabeteseducator.org Resumen El recuento de carbohidratos es un mtodo para llevar un registro de la cantidad de carbohidratos que se ingieren. La ingesta de carbohidratos aumenta la cantidad de azcar (glucosa) en la sangre. El recuento de la cantidad de carbohidratos que ingiere mejora el control de su glucemia. Esto le ayuda a controlar su diabetes. Un nutricionista puede ayudarlo a crear un plan de alimentacin y a calcular la cantidad de carbohidratos que debe ingerir en cada comida y colacin. Esta informacin no tiene como fin reemplazar el consejo del mdico. Asegrese de hacerle al mdico cualquier pregunta que tenga. Document Revised: 12/15/2019 Document Reviewed: 12/15/2019 Elsevier Patient Education  2023 Elsevier Inc.  

## 2021-10-12 LAB — MICROALBUMIN / CREATININE URINE RATIO
Creatinine, Urine: 83.1 mg/dL
Microalb/Creat Ratio: 4 mg/g creat (ref 0–29)
Microalbumin, Urine: 3 ug/mL

## 2021-10-17 ENCOUNTER — Ambulatory Visit: Payer: Self-pay | Admitting: Gerontology

## 2021-10-19 ENCOUNTER — Other Ambulatory Visit: Payer: Self-pay

## 2021-10-19 ENCOUNTER — Encounter: Payer: Self-pay | Admitting: Gerontology

## 2021-10-19 ENCOUNTER — Ambulatory Visit: Payer: Self-pay | Admitting: Gerontology

## 2021-10-19 VITALS — BP 127/84 | HR 75 | Temp 98.1°F | Ht 62.0 in | Wt 165.5 lb

## 2021-10-19 DIAGNOSIS — H6123 Impacted cerumen, bilateral: Secondary | ICD-10-CM

## 2021-10-19 DIAGNOSIS — H6690 Otitis media, unspecified, unspecified ear: Secondary | ICD-10-CM | POA: Insufficient documentation

## 2021-10-19 DIAGNOSIS — H6591 Unspecified nonsuppurative otitis media, right ear: Secondary | ICD-10-CM

## 2021-10-19 MED ORDER — NEOMYCIN-POLYMYXIN-HC 3.5-10000-1 OT SOLN
3.0000 [drp] | Freq: Four times a day (QID) | OTIC | 0 refills | Status: AC
  Filled 2021-10-19: qty 10, 17d supply, fill #0

## 2021-10-19 MED ORDER — NEOMYCIN-POLYMYXIN-HC 3.5-10000-1 OT SOLN: 3.0000 [drp] | Freq: Four times a day (QID) | OTIC | 0 refills | Status: DC

## 2021-10-19 NOTE — Patient Instructions (Signed)
Add Antibiotics ear drop to RIGHT EAR ONLY  CONTINUE USING DEBROX TO LEFT EAR TO REMOVE WAX

## 2021-10-19 NOTE — Progress Notes (Signed)
Established Patient Office Visit  Subjective   Patient ID: Hector Weber, male    DOB: May 31, 1970  Age: 51 y.o. MRN: [id number]  Chief Complaint  Patient presents with   Follow-up    HPI  Hector Weber  is a 51 y/o male who has history of Anemia, Arthritis, CKD, T2DM, Hypertension, Hyperlipidemia presents for follow up for impacted cerum. He was started on Debrox during last visit, and presents for ear lavage. Overall, he states that he's doing well and offers no further complaint.  Review of Systems  Constitutional: Negative.   HENT:  Negative for ear discharge and ear pain.   Respiratory: Negative.    Cardiovascular: Negative.   Neurological: Negative.       Objective:     BP 127/84 (BP Location: Right Arm, Patient Position: Sitting, Cuff Size: Large)   Pulse 75   Temp 98.1 F (36.7 C) (Oral)   Ht _0  (1.575 m)   Wt 165 lb 8 oz (75.1 kg)   SpO2 96%   BMI 30.27 kg/m  BP Readings from Last 3 Encounters:  10/19/21 127/84  10/11/21 138/84  09/07/21 118/73   Wt Readings from Last 3 Encounters:  10/19/21 165 lb 8 oz (75.1 kg)  10/11/21 164 lb 8 oz (74.6 kg)  09/07/21 167 lb 12.8 oz (76.1 kg)      Physical Exam HENT:     Right Ear: There is no impacted cerumen. Tympanic membrane is erythematous.     Left Ear: There is impacted cerumen.  Cardiovascular:     Rate and Rhythm: Normal rate and regular rhythm.     Pulses: Normal pulses.     Heart sounds: Normal heart sounds.  Pulmonary:     Effort: Pulmonary effort is normal.     Breath sounds: Normal breath sounds.      No results found for any visits on 10/19/21.  Last CBC Lab Results  Component Value Date   WBC 5.1 09/07/2021   HGB 16.2 09/07/2021   HCT 47.8 09/07/2021   MCV 92 09/07/2021   MCH 31.1 09/07/2021   RDW 12.4 09/07/2021   PLT 220 62/22/9798   Last metabolic panel Lab Results  Component Value Date   GLUCOSE 238 (H) 09/07/2021   NA 139 09/07/2021   K 4.1 09/07/2021    CL 100 09/07/2021   CO2 24 09/07/2021   BUN 17 09/07/2021   CREATININE 0.62 (L) 09/07/2021   EGFR 117 09/07/2021   CALCIUM 9.5 09/07/2021   PROT 7.4 09/07/2021   ALBUMIN 4.8 09/07/2021   LABGLOB 2.6 09/07/2021   AGRATIO 1.8 09/07/2021   BILITOT 0.4 09/07/2021   ALKPHOS 112 09/07/2021   AST 15 09/07/2021   ALT 30 09/07/2021   Last lipids Lab Results  Component Value Date   CHOL 152 09/07/2021   HDL 34 (L) 09/07/2021   LDLCALC 64 09/07/2021   TRIG 343 (H) 09/07/2021   CHOLHDL 4.5 09/07/2021   Last hemoglobin A1c Lab Results  Component Value Date   HGBA1C 9.2 (A) 09/07/2021      The 10-year ASCVD risk score (Arnett DK, et al., 2019) is: 15.6%    Assessment & Plan:   1. Bilateral impacted cerumen -Cerum was removed from right ear, he will continue using debrox to left ear. Advised not to use Qtips. - Ear wax removal  2. Other nonsuppurative otitis media of right ear, unspecified chronicity - Cerumen removed from right ear, TM erythematous, was started on  cortisporin, educated on medication use and side effects and advised to notify clinic - neomycin-polymyxin-hydrocortisone (CORTISPORIN) OTIC solution; Place 3 drops into the right ear 4 (four) times daily for 10 days.  Dispense: 10 mL; Refill: 0   Return in about 19 days (around 11/07/2021), or if symptoms worsen or fail to improve.    Paisley Grajeda Jerold Coombe, NP

## 2021-10-20 ENCOUNTER — Other Ambulatory Visit: Payer: Self-pay | Admitting: Pharmacy Technician

## 2021-10-20 ENCOUNTER — Other Ambulatory Visit: Payer: Self-pay

## 2021-10-20 NOTE — Patient Outreach (Signed)
Patient only signed DOH Attestation.  Would need to provide current year's household income if PAP medications were needed.  Sammantha Mehlhaff J. Syona Wroblewski Patient Advocate Specialist [facility]  

## 2021-11-08 ENCOUNTER — Ambulatory Visit: Payer: Self-pay | Admitting: Gerontology

## 2021-11-08 ENCOUNTER — Telehealth: Payer: Self-pay | Admitting: Gerontology

## 2021-11-08 NOTE — Telephone Encounter (Signed)
-----   Message from Rolm Gala, NP sent at 11/08/2021  2:40 PM EDT ----- Pls schedule in person follow up appointment for patient on 12/12/21 and make telephone note. Thank you

## 2021-11-09 ENCOUNTER — Telehealth: Payer: Self-pay | Admitting: Emergency Medicine

## 2021-11-09 NOTE — Telephone Encounter (Signed)
Called patient, spoke with his wife, scheduled OV for 12/14/21 @10 :30am.

## 2021-11-09 NOTE — Telephone Encounter (Signed)
-----   Message from Rolm Gala, NP sent at 11/08/2021  2:40 PM EDT ----- Pls schedule in person follow up appointment for patient on 12/12/21 and make telephone note. Thank you

## 2021-11-15 ENCOUNTER — Other Ambulatory Visit: Payer: Self-pay

## 2021-12-14 ENCOUNTER — Encounter: Payer: Self-pay | Admitting: Gerontology

## 2021-12-14 ENCOUNTER — Other Ambulatory Visit: Payer: Self-pay

## 2021-12-14 ENCOUNTER — Ambulatory Visit: Payer: Self-pay | Admitting: Gerontology

## 2021-12-14 VITALS — BP 119/75 | HR 76 | Temp 98.1°F | Resp 16 | Ht 63.0 in | Wt 166.2 lb

## 2021-12-14 DIAGNOSIS — E785 Hyperlipidemia, unspecified: Secondary | ICD-10-CM

## 2021-12-14 DIAGNOSIS — E119 Type 2 diabetes mellitus without complications: Secondary | ICD-10-CM

## 2021-12-14 DIAGNOSIS — I10 Essential (primary) hypertension: Secondary | ICD-10-CM

## 2021-12-14 LAB — POCT GLYCOSYLATED HEMOGLOBIN (HGB A1C): Hemoglobin A1C: 8 % — AB (ref 4.0–5.6)

## 2021-12-14 LAB — GLUCOSE, POCT (MANUAL RESULT ENTRY): POC Glucose: 198 mg/dl — AB (ref 70–99)

## 2021-12-14 MED ORDER — CARVEDILOL 12.5 MG PO TABS: ORAL_TABLET | ORAL | 3 refills | Status: DC

## 2021-12-14 MED ORDER — GLIPIZIDE ER 10 MG PO TB24: 10.0000 mg | ORAL_TABLET | Freq: Every day | ORAL | 3 refills | Status: DC

## 2021-12-14 MED ORDER — METFORMIN HCL 1000 MG PO TABS: ORAL_TABLET | ORAL | 3 refills | Status: DC

## 2021-12-14 MED ORDER — ATORVASTATIN CALCIUM 40 MG PO TABS: 40.0000 mg | ORAL_TABLET | Freq: Every day | ORAL | 3 refills | Status: DC

## 2021-12-14 NOTE — Patient Instructions (Signed)
Recuento de carbohidratos para la diabetes mellitus en los adultos Carbohydrate Counting for Diabetes Mellitus, Adult El recuento de carbohidratos es un mtodo para llevar un registro de la cantidad de carbohidratos que se ingieren. La ingesta de carbohidratos aumenta la cantidad de azcar (glucosa) en la sangre. El recuento de la cantidad de carbohidratos que ingiere mejora el control de su glucemia. Esto, a su vez, le ayuda a controlar su diabetes. Los carbohidratos se miden en gramos (g) por porcin. Es importante saber la cantidad de carbohidratos (en gramos o por tamao de porcin) que se puede ingerir en cada comida. Esto es diferente en cada persona. Un nutricionista puede ayudarlo a crear un plan de alimentacin y a calcular la cantidad de carbohidratos que debe ingerir en cada comida y colacin. Qu alimentos contienen carbohidratos? Los siguientes alimentos incluyen carbohidratos: Granos, como panes y cereales. Frijoles secos y productos con soja. Verduras con almidn, como papas, guisantes y maz. Frutas y jugos de frutas. Leche y yogur. Dulces y colaciones, como pasteles, galletas, caramelos, papas fritas de bolsa y refrescos. Cmo se calculan los carbohidratos de los alimentos? Hay dos maneras de calcular los carbohidratos de los alimentos. Puede leer las etiquetas de los alimentos o aprender cules son los tamaos de las porciones estndar de los alimentos. Puede usar cualquiera de estos mtodos o una combinacin de ambos. Usar la etiqueta de informacin nutricional La lista de Informacin nutricional est incluida en las etiquetas de casi todas las bebidas y los alimentos envasados de los [place]. Esto incluye lo siguiente: El tamao de la porcin. Informacin sobre los nutrientes de cada porcin, incluidos los gramos de carbohidratos por porcin. Para usar la Informacin nutricional, decida cuntas porciones tomar. Luego, multiplique el nmero de porciones por la cantidad  de carbohidratos por porcin. El nmero resultante es la cantidad total de carbohidratos que comer. Conocer los tamaos de las porciones estndar de los alimentos Cuando coma alimentos que contengan carbohidratos y que no estn envasados o no incluyan la informacin nutricional en la etiqueta, debe medir las porciones para poder calcular los gramos de carbohidratos. Mida los alimentos que comer con una balanza de alimentos o una taza medidora, si es necesario. Decida cuntas porciones de tamao estndar comer. Multiplique el nmero de porciones por 15. En los alimentos que contienen carbohidratos, una porcin equivale a 15 g de carbohidratos. Por ejemplo, si come 2 tazas o 10 onzas (300 g) de fresas, habr comido 2 porciones y 30 g de carbohidratos (2 porciones x 15 g = 30 g). En el caso de las comidas que contienen mezclas de ms de un alimento, como las sopas y los guisos, debe calcular los carbohidratos de cada alimento que se incluye. La siguiente lista contiene los tamaos de porciones estndar de los alimentos ricos en carbohidratos ms comunes. Cada una de estas porciones tiene aproximadamente 15 g de carbohidratos: 1 rebanada de pan. 1 tortilla de seis pulgadas (15 cm). ? de taza o 2 onzas (53 g) de arroz o pasta cocidos.  taza o 3 onzas (85 g) de lentejas o frijoles cocidos o enlatados, escurridos y enjuagados.  taza o 3 onzas (85 g) de verduras con almidn, como guisantes, maz o zapallo.  taza o 4 onzas (120 g) de cereal caliente.  taza o 3 onzas (85 g) de papas hervidas o en pur, o  o 3 onzas (85 g) de una papa grande al horno.  taza o 4 onzas fluidas (118 ml) de jugo de frutas. 1 taza u   8 onzas fluidas (237 ml) de leche. 1 unidad pequea o 4 onzas (106 g) de manzana.  unidad o 2 onzas (63 g) de una banana mediana. 1 taza o 5 oz (150 g) de fresas. 3 tazas o 1 oz (28.3 g) de palomitas de maz. Cul sera un ejemplo de recuento de carbohidratos? Para calcular los gramos  de carbohidratos de este ejemplo de comida, siga los pasos que se describen a continuacin. Ejemplo de comida 3 onzas (85 g) de pechugas de pollo. ? de taza o 4 onzas (106 g) de arroz integral.  taza o 3 onzas (85 g) de maz. 1 taza u 8 onzas fluidas (237 ml) de leche. 1 taza o 5 onzas (150 g) de fresas con crema batida sin azcar. Clculo de carbohidratos Identifique los alimentos que contienen carbohidratos: Arroz. Maz. Leche. Fresas. Calcule cuntas porciones come de cada alimento: 2 porciones de arroz. 1 porcin de maz. 1 porcin de leche. 1 porcin de fresas. Multiplique cada nmero de porciones por 15 g: 2 porciones de arroz x 15 g = 30 g. 1 porcin de maz x 15 g = 15 g. 1 porcin de leche x 15 g = 15 g. 1 porcin de fresas x 15 g = 15 g. Sume todas las cantidades para conocer el total de gramos de carbohidratos consumidos: 30 g + 15 g + 15 g + 15 g = 75 g de carbohidratos en total. Consejos para seguir este plan Al ir de compras El[employer]ore un plan de comidas y luego haga una lista de compras. Compre verduras frescas y congeladas, frutas frescas y congeladas, productos lcteos, huevos, frijoles, lentejas y cereales integrales. Fjese en las etiquetas de los alimentos. Elija los alimentos que tengan ms fibra y menos azcar. Evite los alimentos procesados y los alimentos con azcares agregados. Planificacin de las comidas Trate de consumir la misma cantidad de gramos de carbohidratos en cada comida y en cada colacin. Planifique tomar comidas y colaciones regulares y equilibradas. Dnde buscar ms informacin American Diabetes Association (Asociacin Estadounidense de la Diabetes): diabetes.org Centers for Disease Control and Prevention (Centros para el Control y la Prevencin de [place]): cdc.gov Academy of Nutrition and Dietetics (Academia de Nutricin y Diettica): eatright.org Association of Diabetes Care & Education Specialists (Asociacin de Especialistas en  Atencin y Educacin sobre la Diabetes): diabeteseducator.org Resumen El recuento de carbohidratos es un mtodo para llevar un registro de la cantidad de carbohidratos que se ingieren. La ingesta de carbohidratos aumenta la cantidad de azcar (glucosa) en la sangre. El recuento de la cantidad de carbohidratos que ingiere mejora el control de su glucemia. Esto le ayuda a controlar su diabetes. Un nutricionista puede ayudarlo a crear un plan de alimentacin y a calcular la cantidad de carbohidratos que debe ingerir en cada comida y colacin. Esta informacin no tiene como fin reemplazar el consejo del mdico. Asegrese de hacerle al mdico cualquier pregunta que tenga. Document Revised: 12/15/2019 Document Reviewed: 12/15/2019 Elsevier Patient Education  2023 Elsevier Inc.  

## 2021-12-14 NOTE — Progress Notes (Signed)
Established Patient Office Visit  Subjective   Patient ID: Hector Weber, male    DOB: Aug 09, 1970  Age: 51 y.o. MRN: [id number]  Chief Complaint  Patient presents with   Follow-up   Diabetes   Hypertension    HPI  Hector Weber  is a 51 y/o male who has history of Anemia, Arthritis, CKD, T2DM, Hypertension, Hyperlipidemia presents for follow up and lab review. His HgbA1c checked during visit decreased from 9.2% to 8%. He states that he's compliant with his medications, denies side effects and continues to make healthy lifestyle changes.Marland Kitchen He reports checking his blood glucose 2-3 times weekly and it was 198 mg/dl when checked during visit. He denies hypo/hyperglycemic symptoms, peripheral neuropathy and performs daily foot checks. Overall, he states that he's doing well and offers no further complaint.  Review of Systems  Constitutional: Negative.   Eyes: Negative.   Respiratory: Negative.    Cardiovascular: Negative.   Neurological: Negative.   Endo/Heme/Allergies: Negative.   Psychiatric/Behavioral: Negative.        Objective:     BP 119/75 (BP Location: Left Arm, Patient Position: Sitting, Cuff Size: Large)   Pulse 76   Temp 98.1 F (36.7 C) (Oral)   Resp 16   Ht _0  (1.6 m)   Wt 166 lb 3.2 oz (75.4 kg)   SpO2 97%   BMI 29.44 kg/m  BP Readings from Last 3 Encounters:  12/14/21 119/75  10/19/21 127/84  10/11/21 138/84   Wt Readings from Last 3 Encounters:  12/14/21 166 lb 3.2 oz (75.4 kg)  10/19/21 165 lb 8 oz (75.1 kg)  10/11/21 164 lb 8 oz (74.6 kg)      Physical Exam HENT:     Head: Normocephalic and atraumatic.     Mouth/Throat:     Mouth: Mucous membranes are moist.  Eyes:     Extraocular Movements: Extraocular movements intact.     Conjunctiva/sclera: Conjunctivae normal.     Pupils: Pupils are equal, round, and reactive to light.  Cardiovascular:     Rate and Rhythm: Normal rate and regular rhythm.     Pulses: Normal pulses.      Heart sounds: Normal heart sounds.  Pulmonary:     Effort: Pulmonary effort is normal.     Breath sounds: Normal breath sounds.  Skin:    General: Skin is warm.  Neurological:     General: No focal deficit present.     Mental Status: [name] alert and oriented to person, place, and time. Mental status is at baseline.  Psychiatric:        Mood and Affect: Mood normal.        Behavior: Behavior normal.        Thought Content: Thought content normal.        Judgment: Judgment normal.      Results for orders placed or performed in visit on 12/14/21  POCT HgB A1C  Result Value Ref Range   Hemoglobin A1C 8.0 (A) 4.0 - 5.6 %   HbA1c POC (<> result, manual entry)     HbA1c, POC (prediabetic range)     HbA1c, POC (controlled diabetic range)    POCT Glucose (CBG)  Result Value Ref Range   POC Glucose 198 (A) 70 - 99 mg/dl    Last CBC Lab Results  Component Value Date   WBC 5.1 09/07/2021   HGB 16.2 09/07/2021   HCT 47.8 09/07/2021   MCV 92 09/07/2021  MCH 31.1 09/07/2021   RDW 12.4 09/07/2021   PLT 220 27/74/1287   Last metabolic panel Lab Results  Component Value Date   GLUCOSE 238 (H) 09/07/2021   NA 139 09/07/2021   K 4.1 09/07/2021   CL 100 09/07/2021   CO2 24 09/07/2021   BUN 17 09/07/2021   CREATININE 0.62 (L) 09/07/2021   EGFR 117 09/07/2021   CALCIUM 9.5 09/07/2021   PROT 7.4 09/07/2021   ALBUMIN 4.8 09/07/2021   LABGLOB 2.6 09/07/2021   AGRATIO 1.8 09/07/2021   BILITOT 0.4 09/07/2021   ALKPHOS 112 09/07/2021   AST 15 09/07/2021   ALT 30 09/07/2021   Last lipids Lab Results  Component Value Date   CHOL 152 09/07/2021   HDL 34 (L) 09/07/2021   LDLCALC 64 09/07/2021   TRIG 343 (H) 09/07/2021   CHOLHDL 4.5 09/07/2021   Last hemoglobin A1c Lab Results  Component Value Date   HGBA1C 8.0 (A) 12/14/2021      The 10-year ASCVD risk score (Arnett DK, et al., 2019) is: 14%    Assessment & Plan:   1. Diabetes mellitus without complication ([place]) -  His diabetes is improving,his HgbA1c was 8% and his goal should be less than 7%. He will continue on current medication, encouraged to check fasting blood glucose daily, and his fasting reading goal should be between 80-130 mg/dl. He was encouraged to continue on low carb/non concentrated sweet diet and exercise as tolerated. - POCT HgB A1C - POCT Glucose (CBG) - glipiZIDE (GLUCOTROL XL) 10 MG 24 hr tablet; Take 1 tablet (10 mg total) by mouth daily with breakfast.  Dispense: 30 tablet; Refill: 3 - metFORMIN (GLUCOPHAGE) 1000 MG tablet; TAKE 1 TABLET BY MOUTH TWICE A DAY WITH A MEAL  Dispense: 60 tablet; Refill: 3  2. Hyperlipidemia LDL goal <70 - He will continue on current medication, low fat/cholesterol diet and exercise as tolerated. - atorvastatin (LIPITOR) 40 MG tablet; Take 1 tablet (40 mg total) by mouth daily.  Dispense: 30 tablet; Refill: 3  3. Essential hypertension - His blood pressure is under control and he will continue on current medication, DASH diet and exercise as tolerated. - carvedilol (COREG) 12.5 MG tablet; Take 12.5 mg twice daily  Dispense: 60 tablet; Refill: 3    Return in about 3 months (around 03/15/2022), or if symptoms worsen or fail to improve.    Joseline Mccampbell Jerold Coombe, NP

## 2022-03-15 ENCOUNTER — Ambulatory Visit: Payer: Self-pay | Admitting: Gerontology

## 2022-03-22 ENCOUNTER — Other Ambulatory Visit: Payer: Self-pay

## 2022-03-22 ENCOUNTER — Ambulatory Visit: Payer: Self-pay | Admitting: Gerontology

## 2022-03-22 VITALS — BP 126/77 | HR 77 | Wt 165.0 lb

## 2022-03-22 DIAGNOSIS — I10 Essential (primary) hypertension: Secondary | ICD-10-CM

## 2022-03-22 DIAGNOSIS — E119 Type 2 diabetes mellitus without complications: Secondary | ICD-10-CM

## 2022-03-22 LAB — POCT GLYCOSYLATED HEMOGLOBIN (HGB A1C): Hemoglobin A1C: 10 % — AB (ref 4.0–5.6)

## 2022-03-22 LAB — GLUCOSE, POCT (MANUAL RESULT ENTRY): POC Glucose: 224 mg/dl — AB (ref 70–99)

## 2022-03-22 MED ORDER — CARVEDILOL 12.5 MG PO TABS
ORAL_TABLET | ORAL | 3 refills | Status: DC
  Filled 2022-03-22: qty 60, 30d supply, fill #0

## 2022-03-22 MED ORDER — METFORMIN HCL 1000 MG PO TABS
ORAL_TABLET | ORAL | 3 refills | Status: DC
  Filled 2022-03-22: qty 60, 30d supply, fill #0

## 2022-03-22 MED ORDER — GLIPIZIDE ER 10 MG PO TB24
10.0000 mg | ORAL_TABLET | Freq: Every day | ORAL | 3 refills | Status: DC
  Filled 2022-03-22: qty 30, 30d supply, fill #0

## 2022-03-22 NOTE — Progress Notes (Signed)
Established Patient Office Visit  Subjective   Patient ID: Hector Weber, male    DOB: Aug 19, 1970  Age: 51 y.o. MRN: [id number]  Chief Complaint  Patient presents with   Follow-up    HPI  Hector Weber  is a 51 y/o male who has history  Anemia, Arthritis, CKD, T2DM, Hypertension, Hyperlipidemia presents for a follow up. His Hgb A1C was checked during the visit today and has increased from 8% to 10%. His blood glucose is 224 mg/dL during today visit. He states that he has not checked his blood glucose due to being busy taking care of kids and working daily. He states that he has test strips and the glucometer. He expresses the importance of monitoring his blood glucose.Marland Kitchen He denies any hypo/hyperglycemic episodes.He states that he is compliant with all his medications, He states that he smokes 1 pack of cigarettes will last him for one month and at this time has no desire to quit. He states that he  drinks 3-4 beers per month. He states that he is following heathy lifestyle changes. Overall he states that he is doing well and offers no further compliant.   Review of Systems  Constitutional: Negative.   HENT: Negative.    Eyes: Negative.   Respiratory: Negative.    Cardiovascular: Negative.   Gastrointestinal: Negative.   Genitourinary: Negative.   Musculoskeletal: Negative.   Skin: Negative.   Neurological: Negative.   Endo/Heme/Allergies: Negative.   Psychiatric/Behavioral: Negative.        Objective:    BP 126/77   Pulse 77   Wt 165 lb (74.8 kg)   SpO2 98%   BMI 29.23 kg/m  BP Readings from Last 3 Encounters:  03/22/22 126/77  12/14/21 119/75  10/19/21 127/84   Wt Readings from Last 3 Encounters:  03/22/22 165 lb (74.8 kg)  12/14/21 166 lb 3.2 oz (75.4 kg)  10/19/21 165 lb 8 oz (75.1 kg)      Physical Exam Constitutional:      Appearance: [name] normal weight.  HENT:     Mouth/Throat:     Mouth: Mucous membranes are moist.     Pharynx: Oropharynx  is clear.  Eyes:     Conjunctiva/sclera: Conjunctivae normal.  Cardiovascular:     Rate and Rhythm: Normal rate and regular rhythm.     Pulses: Normal pulses.     Heart sounds: Normal heart sounds.  Pulmonary:     Effort: Pulmonary effort is normal.     Breath sounds: Normal breath sounds.  Abdominal:     Palpations: Abdomen is soft.  Skin:    General: Skin is warm and dry.  Neurological:     General: No focal deficit present.     Mental Status: [name] alert and oriented to person, place, and time. Mental status is at baseline.  Psychiatric:        Mood and Affect: Mood normal.      No results found for any visits on 03/22/22.  Last CBC Lab Results  Component Value Date   WBC 5.1 09/07/2021   HGB 16.2 09/07/2021   HCT 47.8 09/07/2021   MCV 92 09/07/2021   MCH 31.1 09/07/2021   RDW 12.4 09/07/2021   PLT 220 13/24/4010   Last metabolic panel Lab Results  Component Value Date   GLUCOSE 238 (H) 09/07/2021   NA 139 09/07/2021   K 4.1 09/07/2021   CL 100 09/07/2021   CO2 24 09/07/2021  BUN 17 09/07/2021   CREATININE 0.62 (L) 09/07/2021   EGFR 117 09/07/2021   CALCIUM 9.5 09/07/2021   PROT 7.4 09/07/2021   ALBUMIN 4.8 09/07/2021   LABGLOB 2.6 09/07/2021   AGRATIO 1.8 09/07/2021   BILITOT 0.4 09/07/2021   ALKPHOS 112 09/07/2021   AST 15 09/07/2021   ALT 30 09/07/2021   Last lipids Lab Results  Component Value Date   CHOL 152 09/07/2021   HDL 34 (L) 09/07/2021   LDLCALC 64 09/07/2021   TRIG 343 (H) 09/07/2021   CHOLHDL 4.5 09/07/2021   Last hemoglobin A1c Lab Results  Component Value Date   HGBA1C 8.0 (A) 12/14/2021   Last thyroid functions Lab Results  Component Value Date   TSH 1.200 05/20/2018   Last vitamin D No results found for: "25OHVITD2", "25OHVITD3", "VD25OH" Last vitamin B12 and Folate No results found for: "VITAMINB12", "FOLATE"    The 10-year ASCVD risk score (Arnett DK, et al., 2019) is: 15.4%    Assessment & Plan:  1.  Diabetes mellitus without complication ([place]) -His HgB A1C has increased from 8% to 10%. He has been encouraged to check blood glucose twice a day and to keep a log. [name] scheduled to see endocrinology on 04/22/2022) he will continue on current medication. His fasting goal should be between 80-130 mg/dL. He was encouraged to continue on low carb/non concentrated sweet diet and exercise as tolerated. He will follow up with [facility] team on 04/26/22. - POCT HgB A1C; Future - POCT Glucose (CBG); Future - metFORMIN (GLUCOPHAGE) 1000 MG tablet; TAKE 1 TABLET BY MOUTH TWICE A DAY WITH A MEAL  Dispense: 60 tablet; Refill: 3 - glipiZIDE (GLUCOTROL XL) 10 MG 24 hr tablet; Take 1 tablet (10 mg total) by mouth daily with breakfast.  Dispense: 30 tablet; Refill: 3  2. Essential hypertension -His blood pressure is under control and he will continue on current medication, DASH diet and exercise as tolerated.  - carvedilol (COREG) 12.5 MG tablet; Take 12.5 mg twice daily  Dispense: 60 tablet; Refill: 3    F/U on or around (05/02/2022), or if symptoms worsen.    Sharon Mt, FNP student

## 2022-03-22 NOTE — Patient Instructions (Signed)
Plan de alimentacin DASH DASH Eating Plan DASH es la sigla en ingls de "Enfoques Alimentarios para Detener la Hipertensin". El plan de alimentacin DASH ha demostrado: [place]aj[place] la presin arterial elevada (hipertensin). Reducir el riesgo de diabetes tipo 2, enfermedad cardaca y accidente cerebrovascular. Ayudar a perder peso. Consejos para seguir este plan Leer las etiquetas de los alimentos Verifique la cantidad de sal (sodio) por porcin en las etiquetas de los alimentos. Elija alimentos con menos del 5 por ciento del valor diario de sodio. Generalmente, los alimentos con menos de 300 miligramos (mg) de sodio por porcin se encuadran dentro de este plan alimentario. Para encontrar cereales integrales, busque la palabra "integral" como primera palabra en la lista de ingredientes. Al ir de compras Compre productos en los que en su etiqueta diga: "bajo contenido de sodio" o "sin agregado de sal". Compre alimentos frescos. Evite los alimentos enlatados y comidas precocidas o congeladas. Al cocinar Evite agregar sal cuando cocine. Use hierbas o aderezos sin sal, en lugar de sal de mesa o sal marina. Consulte al mdico o farmacutico antes de usar sustitutos de la sal. No fra los alimentos. A la hora de cocinar los alimentos opte por hornearlos, hervirlos, grillarlos, asarlos al horno y asarlos a la parrilla. Cocine con aceites cardiosaludables, como oliva, canola, aguacate, soja o girasol. Planificacin de las comidas  Consuma una dieta equilibrada, que incluya lo siguiente: 4 o ms porciones de frutas y 4 o ms porciones de verduras por da. Trate de que medio plato de cada comida sea de frutas y verduras. De 6 a 8 porciones de cereales integrales todos los das. Menos de 6 onzas (170 g) de carne, aves o pescado magros por da. Una porcin de 3 onzas (85 g) de carne tiene casi el mismo tamao que un mazo de cartas. Un huevo equivale a 1 onza (28 g). De 2 a 3 porciones de productos lcteos  descremados por da. Una porcin es 1 taza (237 ml). 1 porcin de frutos secos, semillas o frijoles 5 veces por semana. De 2 a 3 porciones de grasas cardiosaludables. Las grasas saludables llamadas cidos grasos omega-3 se encuentran en alimentos como las nueces, las semillas de lino, las leches fortificadas y los huevos. Estas grasas tambin se encuentran en los pescados de agua fra, como la sardina, el salmn y la caballa. Limite la cantidad que consume de: Alimentos enlatados o envasados. Alimentos con alto contenido de grasa trans, como algunos alimentos fritos. Alimentos con alto contenido de grasa saturada, como carne con grasa. Postres y otros dulces, bebidas azucaradas y otros alimentos con azcar agregada. Productos lcteos enteros. No le agregue sal a los alimentos antes de probarlos. No coma ms de 4 yemas de huevo por semana. Trate de comer al menos 2 comidas vegetarianas por semana. Consuma ms comida casera y menos de restaurante, de bares y comida rpida. Estilo de vida Cuando coma en un restaurante, pida que preparen su comida con menos sal o, en lo posible, sin nada de sal. Si bebe alcohol: Limite la cantidad que bebe: De 0 a 1 medida por da para las mujeres que no estn embarazadas. De 0 a 2 medidas por da para los hombres. Est atento a la cantidad de alcohol que hay en las bebidas que toma. En los [place], una medida equivale a una botella de cerveza de 12 oz (355 ml), un vaso de vino de 5 oz (148 ml) o un vaso de una bebida alcohlica de alta graduacin de 1 oz (  44 ml). Informacin general Evite ingerir ms de 2300 mg de sal por da. Si tiene hipertensin, es posible que necesite reducir la ingesta de sodio a 1,500 mg por da. Trabaje con su mdico para mantener un peso saludable o perder peso. Pregntele cul es el peso recomendado para usted. Realice al menos 30 minutos de ejercicio que haga que se acelere su corazn (ejercicio aerbico) la mayora de los das  de la semana. Estas actividades pueden incluir caminar, nadar o andar en bicicleta. Trabaje con su mdico o nutricionista para ajustar su plan alimentario a sus necesidades calricas personales. Qu alimentos debo comer? Frutas Todas las frutas frescas, congeladas o disecadas. Frutas enlatadas en jugo natural (sin agregado de azcar). Verduras Verduras frescas o congeladas (crudas, al vapor, asadas o grilladas). Jugos de tomate y verduras con bajo contenido de sodio o reducidos en sodio. Salsa y pasta de tomate con bajo contenido de sodio o reducidas en sodio. Verduras enlatadas con bajo contenido de sodio o reducidas en sodio. Granos Pan de salvado o integral. Pasta de salvado o integral. Arroz integral. Avena. Quinua. Trigo burgol. Cereales integrales y con bajo contenido de sodio. Pan pita. Galletitas de agua con bajo contenido de grasa y sodio. Tortillas de harina integral. Carnes y otras protenas Pollo o pavo sin piel. Carne de pollo o de pavo molida. Cerdo desgrasado. Pescado y mariscos. Claras de huevo. Porotos, guisantes o lentejas secos. Frutos secos, mantequilla de frutos secos y semillas sin sal. Frijoles enlatados sin sal. Cortes de carne vacuna magra, desgrasada. Carne precocida o curada magra y baja en sodio, como embutidos o panes de carne. Lcteos Leche descremada (1 %) o descremada. Quesos reducidos en grasa, con bajo contenido de grasa o descremados. Queso blanco o ricota sin grasa, con bajo contenido de sodio. Yogur semidescremado o descremado. Queso con bajo contenido de grasa y sodio. Grasas y aceites Margarinas untables que no contengan grasas trans. Aceite vegetal. Mayonesa y aderezos para ensaladas livianos, reducidos en grasa o con bajo contenido de grasas (reducidos en sodio). Aceite de canola, crtamo, oliva, aguacate, soja y girasol. Aguacate. Alios y condimentos Hierbas. Especias. Mezclas de condimentos sin sal. Otros alimentos Palomitas de maz y pretzels sin sal.  Dulces con bajo contenido de grasas. Es posible que los productos que se enumeran ms arriba no constituyan una lista completa de los alimentos y las bebidas que puede tomar. Consulte a un nutricionista para obtener ms informacin. Qu alimentos debo evitar? Frutas Fruta enlatada en almbar liviano o espeso. Frutas cocidas en aceite. Frutas con salsa de crema o mantequilla. Verduras Verduras con crema o fritas. Verduras en salsa de queso. Verduras enlatadas regulares (que no sean con bajo contenido de sodio o reducidas en sodio). Pasta y salsa de tomates enlatadas regulares (que no sean con bajo contenido de sodio o reducidas en sodio). Jugos de tomate y verduras regulares (que no sean con bajo contenido de sodio o reducidos en sodio). Pepinillos. Aceitunas. Granos Productos de panificacin hechos con grasa, como medialunas, magdalenas y algunos panes. Comidas con arroz o pasta seca listas para usar. Carnes y otras protenas Cortes de carne con alto contenido de grasa. Costillas. Carne frita. Tocino. Mortadela, salame y otras carnes precocidas o curadas, como embutidos o panes de carne. Grasa de la espalda del cerdo (panceta). Salchicha de cerdo. Frutos secos y semillas con sal. Frijoles enlatados con agregado de sal. Pescado enlatado o ahumado. Huevos enteros o yemas. Pollo o pavo con piel. Lcteos Leche entera o al 2 %, crema   y mitad leche y mitad crema. Queso crema entero o con toda su grasa. Yogur entero o endulzado. Quesos con toda su grasa. Sustitutos de cremas no lcteas. Coberturas batidas. Quesos para untar y quesos procesados. Grasas y aceites Mantequilla. Margarina en barra. Manteca de cerdo. Lardo. Mantequilla clarificada. Grasa de panceta. Aceites tropicales como aceite de coco, palmiste o palma. Alios y condimentos Sal de cebolla, sal de ajo, sal condimentada, sal de mesa y sal marina. Salsa [place]. Salsa trtara. Salsa barbacoa. Salsa teriyaki. Salsa de soja, incluso la que  tiene contenido reducido de sodio. Salsa de carne. Salsas en lata y envasadas. Salsa de pescado. Salsa de ostras. Salsa rosada. Rbanos picantes comprados en tiendas. [place]tchup. Mostaza. Saborizantes y tiernizantes para carne. Caldo en cubitos. Salsas picantes. Adobos preelaborados o envasados. Aderezos para tacos preelaborados o envasados. Salsas de pepinillos. Aderezos comunes para ensalada. Otros alimentos Palomitas de maz y pretzels con sal. Es posible que los productos que se enumeran ms arriba no constituyan una lista completa de los alimentos y las bebidas que debe evitar. Consulte a un nutricionista para obtener ms informacin. Dnde buscar ms informacin National Heart, Lung, and Blood Institute (Instituto Nacional del Corazn, los Pulmones y la Sangre): www.nhlbi.nih.gov American Heart Association (Asociacin Estadounidense del Corazn): www.heart.org Academy of Nutrition and Dietetics (Academia de Nutricin y Diettica): www.eatright.org National Kidney Foundation (Fundacin Nacional del Rin): www.kidney.org Resumen El plan de alimentacin DASH ha demostrado bajar la presin arterial elevada (hipertensin). Tambin puede reducir el riesgo de diabetes tipo 2, enfermedad cardaca y accidente cerebrovascular. Cuando siga el plan de alimentacin DASH, trate de comer ms frutas frescas y verduras, cereales integrales, carnes magras, lcteos descremados y grasas cardiosaludables. Con el plan de alimentacin DASH, deber limitar el consumo de sal (sodio) a 2,300 mg por da. Si tiene hipertensin, es posible que necesite reducir la ingesta de sodio a 1,500 mg por da. Trabaje con su mdico o nutricionista para ajustar su plan alimentario a sus necesidades calricas personales. Esta informacin no tiene como fin reemplazar el consejo del mdico. Asegrese de hacerle al mdico cualquier pregunta que tenga. Document Revised: 04/30/2019 Document Reviewed: 04/30/2019 Elsevier Patient Education   2023 Elsevier Inc. Recuento de carbohidratos para la diabetes mellitus en los adultos Carbohydrate Counting for Diabetes Mellitus, Adult El recuento de carbohidratos es un mtodo para llevar un registro de la cantidad de carbohidratos que se ingieren. La ingesta de carbohidratos aumenta la cantidad de azcar (glucosa) en la sangre. El recuento de la cantidad de carbohidratos que ingiere mejora el control de su glucemia. Esto, a su vez, le ayuda a controlar su diabetes. Los carbohidratos se miden en gramos (g) por porcin. Es importante saber la cantidad de carbohidratos (en gramos o por tamao de porcin) que se puede ingerir en cada comida. Esto es diferente en cada persona. Un nutricionista puede ayudarlo a crear un plan de alimentacin y a calcular la cantidad de carbohidratos que debe ingerir en cada comida y colacin. Qu alimentos contienen carbohidratos? Los siguientes alimentos incluyen carbohidratos: Granos, como panes y cereales. Frijoles secos y productos con soja. Verduras con almidn, como papas, guisantes y maz. Frutas y jugos de frutas. Leche y yogur. Dulces y colaciones, como pasteles, galletas, caramelos, papas fritas de bolsa y refrescos. Cmo se calculan los carbohidratos de los alimentos? Hay dos maneras de calcular los carbohidratos de los alimentos. Puede leer las etiquetas de los alimentos o aprender cules son los tamaos de las porciones estndar de los alimentos. Puede usar cualquiera de estos mtodos   o una combinacin de ambos. Usar la etiqueta de informacin nutricional La lista de Informacin nutricional est incluida en las etiquetas de casi todas las bebidas y los alimentos envasados de los [place]. Esto incluye lo siguiente: El tamao de la porcin. Informacin sobre los nutrientes de cada porcin, incluidos los gramos de carbohidratos por porcin. Para usar la Informacin nutricional, decida cuntas porciones tomar. Luego, multiplique el nmero de  porciones por la cantidad de carbohidratos por porcin. El nmero resultante es la cantidad total de carbohidratos que comer. Conocer los tamaos de las porciones estndar de los alimentos Cuando coma alimentos que contengan carbohidratos y que no estn envasados o no incluyan la informacin nutricional en la etiqueta, debe medir las porciones para poder calcular los gramos de carbohidratos. Mida los alimentos que comer con una balanza de alimentos o una taza medidora, si es necesario. Decida cuntas porciones de tamao estndar comer. Multiplique el nmero de porciones por 15. En los alimentos que contienen carbohidratos, una porcin equivale a 15 g de carbohidratos. Por ejemplo, si come 2 tazas o 10 onzas (300 g) de fresas, habr comido 2 porciones y 30 g de carbohidratos (2 porciones x 15 g = 30 g). En el caso de las comidas que contienen mezclas de ms de un alimento, como las sopas y los guisos, debe calcular los carbohidratos de cada alimento que se incluye. La siguiente lista contiene los tamaos de porciones estndar de los alimentos ricos en carbohidratos ms comunes. Cada una de estas porciones tiene aproximadamente 15 g de carbohidratos: 1 rebanada de pan. 1 tortilla de seis pulgadas (15 cm). ? de taza o 2 onzas (53 g) de arroz o pasta cocidos.  taza o 3 onzas (85 g) de lentejas o frijoles cocidos o enlatados, escurridos y enjuagados.  taza o 3 onzas (85 g) de verduras con almidn, como guisantes, maz o zapallo.  taza o 4 onzas (120 g) de cereal caliente.  taza o 3 onzas (85 g) de papas hervidas o en pur, o  o 3 onzas (85 g) de una papa grande al horno.  taza o 4 onzas fluidas (118 ml) de jugo de frutas. 1 taza u 8 onzas fluidas (237 ml) de leche. 1 unidad pequea o 4 onzas (106 g) de manzana.  unidad o 2 onzas (63 g) de una banana mediana. 1 taza o 5 oz (150 g) de fresas. 3 tazas o 1 oz (28.3 g) de palomitas de maz. Cul sera un ejemplo de recuento de  carbohidratos? Para calcular los gramos de carbohidratos de este ejemplo de comida, siga los pasos que se describen a continuacin. Ejemplo de comida 3 onzas (85 g) de pechugas de pollo. ? de taza o 4 onzas (106 g) de arroz integral.  taza o 3 onzas (85 g) de maz. 1 taza u 8 onzas fluidas (237 ml) de leche. 1 taza o 5 onzas (150 g) de fresas con crema batida sin azcar. Clculo de carbohidratos Identifique los alimentos que contienen carbohidratos: Arroz. Maz. Leche. Fresas. Calcule cuntas porciones come de cada alimento: 2 porciones de arroz. 1 porcin de maz. 1 porcin de leche. 1 porcin de fresas. Multiplique cada nmero de porciones por 15 g: 2 porciones de arroz x 15 g = 30 g. 1 porcin de maz x 15 g = 15 g. 1 porcin de leche x 15 g = 15 g. 1 porcin de fresas x 15 g = 15 g. Sume todas las cantidades para conocer el total de gramos de carbohidratos   consumidos: 30 g + 15 g + 15 g + 15 g = 75 g de carbohidratos en total. Consejos para seguir este plan Al ir de compras Elabore un plan de comidas y luego haga una lista de compras. Compre verduras frescas y congeladas, frutas frescas y congeladas, productos lcteos, huevos, frijoles, lentejas y cereales integrales. Fjese en las etiquetas de los alimentos. Elija los alimentos que tengan ms fibra y menos azcar. Evite los alimentos procesados y los alimentos con azcares agregados. Planificacin de las comidas Trate de consumir la misma cantidad de gramos de carbohidratos en cada comida y en cada colacin. Planifique tomar comidas y colaciones regulares y equilibradas. Dnde buscar ms informacin American Diabetes Association (Asociacin Estadounidense de la Diabetes): diabetes.org Centers for Disease Control and Prevention (Centros para el Control y la Prevencin de Enfermedades): cdc.gov Academy of Nutrition and Dietetics (Academia de Nutricin y Diettica): eatright.org Association of Diabetes Care & Education  Specialists (Asociacin de Especialistas en Atencin y Educacin sobre la Diabetes): diabeteseducator.org Resumen El recuento de carbohidratos es un mtodo para llevar un registro de la cantidad de carbohidratos que se ingieren. La ingesta de carbohidratos aumenta la cantidad de azcar (glucosa) en la sangre. El recuento de la cantidad de carbohidratos que ingiere mejora el control de su glucemia. Esto le ayuda a controlar su diabetes. Un nutricionista puede ayudarlo a crear un plan de alimentacin y a calcular la cantidad de carbohidratos que debe ingerir en cada comida y colacin. Esta informacin no tiene como fin reemplazar el consejo del mdico. Asegrese de hacerle al mdico cualquier pregunta que tenga. Document Revised: 12/15/2019 Document Reviewed: 12/15/2019 Elsevier Patient Education  2023 Elsevier Inc.  

## 2022-03-23 ENCOUNTER — Other Ambulatory Visit: Payer: Self-pay

## 2022-05-17 ENCOUNTER — Ambulatory Visit: Payer: Self-pay | Admitting: Gerontology

## 2022-05-17 ENCOUNTER — Other Ambulatory Visit: Payer: Self-pay

## 2022-05-17 VITALS — BP 124/78 | HR 86 | Temp 97.9°F | Resp 16 | Ht 65.0 in | Wt 160.9 lb

## 2022-05-17 DIAGNOSIS — E119 Type 2 diabetes mellitus without complications: Secondary | ICD-10-CM

## 2022-05-17 MED ORDER — GLIPIZIDE ER 10 MG PO TB24
10.0000 mg | ORAL_TABLET | Freq: Every day | ORAL | 3 refills | Status: DC
  Filled 2022-05-17: qty 30, 30d supply, fill #0

## 2022-05-17 MED ORDER — EMPAGLIFLOZIN 10 MG PO TABS
10.0000 mg | ORAL_TABLET | Freq: Every day | ORAL | 0 refills | Status: DC
  Filled 2022-05-17: qty 30, 30d supply, fill #0

## 2022-05-17 MED ORDER — METFORMIN HCL 1000 MG PO TABS
ORAL_TABLET | ORAL | 3 refills | Status: DC
  Filled 2022-05-17: qty 60, 30d supply, fill #0

## 2022-05-17 NOTE — Patient Instructions (Addendum)
Recuento de carbohidratos para la diabetes mellitus en los adultos Carbohydrate Counting for Diabetes Mellitus, Adult El recuento de carbohidratos es un mtodo para llevar un registro de la cantidad de carbohidratos que se ingieren. La ingesta de carbohidratos aumenta la cantidad de azcar (glucosa) en la sangre. El recuento de la cantidad de carbohidratos que ingiere mejora el control de su glucemia. Esto, a su vez, le ayuda a controlar su diabetes. Los carbohidratos se miden en gramos (g) por porcin. Es importante saber la cantidad de carbohidratos (en gramos o por tamao de porcin) que se puede ingerir en cada comida. Esto es diferente en cada persona. Un nutricionista puede ayudarlo a crear un plan de alimentacin y a calcular la cantidad de carbohidratos que debe ingerir en cada comida y colacin. Qu alimentos contienen carbohidratos? Los siguientes alimentos incluyen carbohidratos: Granos, como panes y cereales. Frijoles secos y productos con soja. Verduras con almidn, como papas, guisantes y maz. Frutas y jugos de frutas. Leche y yogur. Dulces y colaciones, como pasteles, galletas, caramelos, papas fritas de bolsa y refrescos. Cmo se calculan los carbohidratos de los alimentos? Hay dos maneras de calcular los carbohidratos de los alimentos. Puede leer las etiquetas de los alimentos o aprender cules son los tamaos de las porciones estndar de los alimentos. Puede usar cualquiera de estos mtodos o una combinacin de ambos. Usar la etiqueta de informacin nutricional La lista de Informacin nutricional est incluida en las etiquetas de casi todas las bebidas y los alimentos envasados de los [place]. Esto incluye lo siguiente: El tamao de la porcin. Informacin sobre los nutrientes de cada porcin, incluidos los gramos de carbohidratos por porcin. Para usar la Informacin nutricional, decida cuntas porciones tomar. Luego, multiplique el nmero de porciones por la cantidad  de carbohidratos por porcin. El nmero resultante es la cantidad total de carbohidratos que comer. Conocer los tamaos de las porciones estndar de los alimentos Cuando coma alimentos que contengan carbohidratos y que no estn envasados o no incluyan la informacin nutricional en la etiqueta, debe medir las porciones para poder calcular los gramos de carbohidratos. Mida los alimentos que comer con una balanza de alimentos o una taza medidora, si es necesario. Decida cuntas porciones de tamao estndar comer. Multiplique el nmero de porciones por 15. En los alimentos que contienen carbohidratos, una porcin equivale a 15 g de carbohidratos. Por ejemplo, si come 2 tazas o 10 onzas (300 g) de fresas, habr comido 2 porciones y 30 g de carbohidratos (2 porciones x 15 g = 30 g). En el caso de las comidas que contienen mezclas de ms de un alimento, como las sopas y los guisos, debe calcular los carbohidratos de cada alimento que se incluye. La siguiente lista contiene los tamaos de porciones estndar de los alimentos ricos en carbohidratos ms comunes. Cada una de estas porciones tiene aproximadamente 15 g de carbohidratos: 1 rebanada de pan. 1 tortilla de seis pulgadas (15 cm). ? de taza o 2 onzas (53 g) de arroz o pasta cocidos.  taza o 3 onzas (85 g) de lentejas o frijoles cocidos o enlatados, escurridos y enjuagados.  taza o 3 onzas (85 g) de verduras con almidn, como guisantes, maz o zapallo.  taza o 4 onzas (120 g) de cereal caliente.  taza o 3 onzas (85 g) de papas hervidas o en pur, o  o 3 onzas (85 g) de una papa grande al horno.  taza o 4 onzas fluidas (118 ml) de jugo de frutas. 1 taza u   8 onzas fluidas (237 ml) de leche. 1 unidad pequea o 4 onzas (106 g) de manzana.  unidad o 2 onzas (63 g) de una banana mediana. 1 taza o 5 oz (150 g) de fresas. 3 tazas o 1 oz (28.3 g) de palomitas de maz. Cul sera un ejemplo de recuento de carbohidratos? Para calcular los gramos  de carbohidratos de este ejemplo de comida, siga los pasos que se describen a continuacin. Ejemplo de comida 3 onzas (85 g) de pechugas de pollo. ? de taza o 4 onzas (106 g) de arroz integral.  taza o 3 onzas (85 g) de maz. 1 taza u 8 onzas fluidas (237 ml) de leche. 1 taza o 5 onzas (150 g) de fresas con crema batida sin azcar. Clculo de carbohidratos Identifique los alimentos que contienen carbohidratos: Arroz. Maz. Leche. Fresas. Calcule cuntas porciones come de cada alimento: 2 porciones de arroz. 1 porcin de maz. 1 porcin de leche. 1 porcin de fresas. Multiplique cada nmero de porciones por 15 g: 2 porciones de arroz x 15 g = 30 g. 1 porcin de maz x 15 g = 15 g. 1 porcin de leche x 15 g = 15 g. 1 porcin de fresas x 15 g = 15 g. Sume todas las cantidades para conocer el total de gramos de carbohidratos consumidos: 30 g + 15 g + 15 g + 15 g = 75 g de carbohidratos en total. Consejos para seguir este plan Al ir de compras Elabore un plan de comidas y luego haga una lista de compras. Compre verduras frescas y congeladas, frutas frescas y congeladas, productos lcteos, huevos, frijoles, lentejas y cereales integrales. Fjese en las etiquetas de los alimentos. Elija los alimentos que tengan ms fibra y menos azcar. Evite los alimentos procesados y los alimentos con azcares agregados. Planificacin de las comidas Trate de consumir la misma cantidad de gramos de carbohidratos en cada comida y en cada colacin. Planifique tomar comidas y colaciones regulares y equilibradas. Dnde buscar ms informacin American Diabetes Association (Asociacin Estadounidense de la Diabetes): diabetes.org Centers for Disease Control and Prevention (Centros para el Control y la Prevencin de Enfermedades): cdc.gov Academy of Nutrition and Dietetics (Academia de Nutricin y Diettica): eatright.org Association of Diabetes Care & Education Specialists (Asociacin de Especialistas en  Atencin y Educacin sobre la Diabetes): diabeteseducator.org Resumen El recuento de carbohidratos es un mtodo para llevar un registro de la cantidad de carbohidratos que se ingieren. La ingesta de carbohidratos aumenta la cantidad de azcar (glucosa) en la sangre. El recuento de la cantidad de carbohidratos que ingiere mejora el control de su glucemia. Esto le ayuda a controlar su diabetes. Un nutricionista puede ayudarlo a crear un plan de alimentacin y a calcular la cantidad de carbohidratos que debe ingerir en cada comida y colacin. Esta informacin no tiene como fin reemplazar el consejo del mdico. Asegrese de hacerle al mdico cualquier pregunta que tenga. Document Revised: 12/15/2019 Document Reviewed: 12/15/2019 Elsevier Patient Education  2023 Elsevier Inc.  

## 2022-05-17 NOTE — Progress Notes (Deleted)
   Established Patient Office Visit  Subjective   Patient ID: Hector Weber, male    DOB: 06-04-1970  Age: 52 y.o. MRN: [id number]  No chief complaint on file.   HPI  Hector Weber  is a 52 y/o male who has history of Anemia, Arthritis, CKD, T2DM, Hypertension, Hyperlipidemia presents for follow up   ROS    Objective:     There were no vitals taken for this visit. BP Readings from Last 3 Encounters:  03/22/22 126/77  12/14/21 119/75  10/19/21 127/84   Wt Readings from Last 3 Encounters:  03/22/22 165 lb (74.8 kg)  12/14/21 166 lb 3.2 oz (75.4 kg)  10/19/21 165 lb 8 oz (75.1 kg)      Physical Exam   No results found for any visits on 05/17/22.  Last CBC Lab Results  Component Value Date   WBC 5.1 09/07/2021   HGB 16.2 09/07/2021   HCT 47.8 09/07/2021   MCV 92 09/07/2021   MCH 31.1 09/07/2021   RDW 12.4 09/07/2021   PLT 220 15/17/6160   Last metabolic panel Lab Results  Component Value Date   GLUCOSE 238 (H) 09/07/2021   NA 139 09/07/2021   K 4.1 09/07/2021   CL 100 09/07/2021   CO2 24 09/07/2021   BUN 17 09/07/2021   CREATININE 0.62 (L) 09/07/2021   EGFR 117 09/07/2021   CALCIUM 9.5 09/07/2021   PROT 7.4 09/07/2021   ALBUMIN 4.8 09/07/2021   LABGLOB 2.6 09/07/2021   AGRATIO 1.8 09/07/2021   BILITOT 0.4 09/07/2021   ALKPHOS 112 09/07/2021   AST 15 09/07/2021   ALT 30 09/07/2021   Last lipids Lab Results  Component Value Date   CHOL 152 09/07/2021   HDL 34 (L) 09/07/2021   LDLCALC 64 09/07/2021   TRIG 343 (H) 09/07/2021   CHOLHDL 4.5 09/07/2021   Last hemoglobin A1c Lab Results  Component Value Date   HGBA1C 10.0 (A) 03/22/2022   Last thyroid functions Lab Results  Component Value Date   TSH 1.200 05/20/2018   Last vitamin D No results found for: "25OHVITD2", "25OHVITD3", "VD25OH" Last vitamin B12 and Folate No results found for: "VITAMINB12", "FOLATE"    The 10-year ASCVD risk score (Arnett DK, et al., 2019) is:  16.4%    Assessment & Plan:   Problem List Items Addressed This Visit   None  Follow up in clinic in months or if symptoms worsen.    Sharon Mt, FNP student

## 2022-05-18 ENCOUNTER — Other Ambulatory Visit: Payer: Self-pay

## 2022-05-18 ENCOUNTER — Other Ambulatory Visit (HOSPITAL_BASED_OUTPATIENT_CLINIC_OR_DEPARTMENT_OTHER): Payer: Self-pay

## 2022-05-21 NOTE — Progress Notes (Signed)
Follow up Diabetes/ Endocrine [facility]     Patient ID: Hector Weber, male   DOB: March 29, 1971, 52 y.o.   MRN: [id number] Assessment:  Hector Weber is a 52 y.o. male who is seen in follow up for Diabetes mellitus without complication ([place]) [id number] at the request of [place], Chioma E, NP.  Encounter Diagnoses 1. Diabetes mellitus without complication [facility])     Assessment  Patient is a 52 year-old male who presents for routine DM follow-up. [name] currently not at goal with treatment with A1c of 10 in December, up from 8 in September 2023.  Plan:       DM Increase metformin to 1000 mg BID (up from 1000 mg daily). Continue glipizide at 10 mg daily (since this is already the maximum dose). Initiate Jardiance 10 mg (Jardiance 10 mg was chosen over [place] 5 mg because Vania Rea is procured faster through Medication Management). Patient committed to one lifestyle modification between now and the next follow-up in 3 months (soda intake discussed). Follow up in 3 months for routine DM management (A1c) and lipid panel. Refer to an Ophthalmologist for routine diabetic retinopathy screening. Of note, insulin or a GLP2 agonist was not initiated at this visit as patient reiterated his preference for non-injectable medications. Additionally, the patient's busy lifestyle was taken into account when formulating this plan to optimize medication adherence.   High TG No change to the patient's statin was made during this visit given the isolated increase in TG, which tends to be higher with uncontrolled DM. A lipid panel will be repeated in 3 months along with routine DM labs, and a decision to change the current statin dose will be made upon review of the DM and lipid panel results.    Patient Instructions  Recuento de carbohidratos para la diabetes mellitus en los adultos Carbohydrate Counting for Diabetes Mellitus, Adult El recuento de carbohidratos es un mtodo para llevar un  registro de la cantidad de carbohidratos que se ingieren. La ingesta de carbohidratos aumenta la cantidad de azcar (glucosa) en la sangre. El recuento de la cantidad de carbohidratos que ingiere mejora el control de su glucemia. Esto, a su vez, le ayuda a controlar su diabetes. Los carbohidratos se miden en gramos (g) por porcin. Es importante saber la cantidad de carbohidratos (en gramos o por tamao de porcin) que se puede ingerir en cada comida. Esto es Psychologist, forensic. Un nutricionista puede ayudarlo a crear un plan de alimentacin y a calcular la cantidad de carbohidratos que debe ingerir en cada comida y colacin. Qu alimentos contienen carbohidratos? Los siguientes alimentos incluyen carbohidratos: Granos, como panes y cereales. Frijoles secos y productos con soja. Verduras con almidn, como papas, guisantes y maz. Lambert Mody y jugos de frutas. Leche y Estate agent. Dulces y colaciones, como pasteles, galletas, caramelos, papas fritas de bolsa y refrescos. Cmo se calculan los carbohidratos de los alimentos? Hay dos maneras de calcular los carbohidratos de los alimentos. Puede leer las etiquetas de los alimentos o aprender cules son los tamaos de las porciones estndar de los alimentos. Puede usar cualquiera de estos mtodos o una combinacin de [place]. Usar la Scientific laboratory technician de informacin nutricional La lista de Informacin nutricional est incluida en las etiquetas de casi todas las bebidas y los alimentos envasados de los [place]. Esto incluye lo siguiente: El tamao de la porcin. Informacin sobre los nutrientes de cada porcin, incluidos los gramos de carbohidratos por porcin. Para usar la Informacin nutricional, decida  cuntas porciones tomar. Luego, multiplique el nmero de porciones por la cantidad de carbohidratos por porcin. El nmero resultante es la cantidad total de carbohidratos que comer. Conocer los tamaos de las porciones estndar de los alimentos Cuando coma  alimentos que contengan carbohidratos y que no estn envasados o no incluyan la informacin nutricional en la etiqueta, debe medir las porciones para poder calcular los gramos de carbohidratos. Mida los alimentos que comer con una balanza de alimentos o una taza medidora, si es necesario. Decida cuntas porciones de International aid/development worker. Multiplique el nmero de porciones por 15. En los alimentos que contienen carbohidratos, una porcin [place] a 15 g de carbohidratos. Por ejemplo, si come 2 tazas o 10 onzas (300 g) de fresas, habr comido 2 porciones y 30 g de carbohidratos (2 porciones x 15 g = 30 g). En el caso de las comidas que contienen mezclas de ms de un alimento, como las sopas y los guisos, debe calcular los carbohidratos de cada alimento que se incluye. La siguiente lista contiene los tamaos de porciones estndar de los alimentos ricos en carbohidratos ms comunes. Cada una de estas porciones tiene aproximadamente 15 g de carbohidratos: 1 rebanada de pan. 1 tortilla de seis pulgadas (15 cm). ? de taza o 2 onzas (53 g) de arroz o pasta cocidos.  taza o 3 onzas (85 g) de lentejas o frijoles cocidos o enlatados, escurridos y enjuagados.  taza o 3 onzas (85 g) de verduras con almidn, como guisantes, maz o zapallo.  taza o 4 onzas (120 g) de cereal caliente.  taza o 3 onzas (85 g) de papas hervidas o en pur, o  o 3 onzas (85 g) de una papa grande al horno.  taza o 4 onzas fluidas (118 ml) de jugo de frutas. 1 taza u 8 onzas fluidas (237 ml) de leche. 1 unidad pequea o 4 onzas (106 g) de manzana.  unidad o 2 onzas (63 g) de una banana mediana. 1 taza o 5 oz (150 g) de fresas. 3 tazas o 1 oz (28.3 g) de palomitas de maz. Cul sera un ejemplo de recuento de carbohidratos? Para calcular los gramos de carbohidratos de este ejemplo de comida, siga los pasos que se describen a continuacin. Ejemplo de comida 3 onzas (85 g) de pechugas de pollo. ? de taza o 4 onzas (106 g)  de arroz integral.  taza o 3 onzas (85 g) de maz. 1 taza u 8 onzas fluidas (237 ml) de leche. 1 taza o 5 onzas (150 g) de fresas con crema batida sin azcar. Clculo de carbohidratos Identifique los alimentos que contienen carbohidratos: Arroz. Maz. Leche. Hughie Closs. Calcule cuntas porciones come de cada alimento: 2 porciones de arroz. 1 porcin de maz. [place]. 1 porcin de fresas. Multiplique cada nmero de porciones por 15 g: 2 porciones de arroz x 15 g = 30 g. 1 porcin de maz x 15 g = 15 g. 1 porcin de leche x 15 g = 15 g. 1 porcin de fresas x 15 g = 15 g. Sume todas las cantidades para conocer el total de gramos de carbohidratos consumidos: 30 g + 15 g + 15 g + 15 g = 75 g de carbohidratos en total. Consejos para seguir este plan Al ir de compras [place] un plan de comidas y luego haga una lista de compras. Compre verduras frescas y congeladas, frutas frescas y congeladas, productos lcteos, huevos, frijoles, lentejas y cereales integrales. Fjese en las [employer]  alimentos. Elija los alimentos que tengan ms fibra y Surveyor, minerals. Evite los alimentos procesados y los alimentos con Nurse, learning disability. Planificacin de las comidas Trate de consumir la misma cantidad de gramos de carbohidratos en cada comida y en cada colacin. Planifique tomar comidas y colaciones regulares y equilibradas. Dnde buscar ms informacin American Diabetes Association (Asociacin Estadounidense de la Diabetes): diabetes.org Centers for Disease Control and Prevention (Centros para el Control y la Prevencin de [place]): StoreMirror.com.cy Academy of Nutrition and Dietetics (Academia de Nutricin y Information systems manager): Loss adjuster, chartered.org Association of Diabetes Care & Education Specialists (Asociacin de Especialistas en Atencin y Educacin sobre la Diabetes): diabeteseducator.org Resumen El recuento de carbohidratos es un mtodo para llevar un registro de la cantidad de carbohidratos que se  ingieren. La ingesta de carbohidratos aumenta la cantidad de azcar (glucosa) en la sangre. El recuento de la cantidad de carbohidratos que ingiere mejora el control de su glucemia. Esto le ayuda a Chief Technology Officer su diabetes. Un nutricionista puede ayudarlo a crear un plan de alimentacin y a calcular la cantidad de carbohidratos que debe ingerir en cada comida y colacin. Esta informacin no tiene Marine scientist el consejo del mdico. Asegrese de hacerle al mdico cualquier pregunta que tenga. Document Revised: 12/15/2019 Document Reviewed: 12/15/2019 Elsevier Patient Education  [place].    No orders of the defined types were placed in this encounter.    Subjective:  Patient is a 52 y/o male with a 16-year history of T2DM who presents for routine DM care.  He reports that is doing well managing his DM, measuring his BG twice daily with fasting BG around 150-200 and evening BG slightly higher but in the same range. He currently manages his DM with 1000 mg and glipizide 10 mg daily. He denies any side effects with his medications. He reports being on insulin in the past but states that his skin was "messed up;" he refuses any medication administered as an injection.  Patient denies any symptoms of hypoglycemia, hyperglycemia, peripheral neuropathy, and foot health concerns. He states that he has not had an eye exam in the past year and reports that he "had glasses" but he is not sure why and describes it as "nothing serious" when asked about the diagnosis that warranted the glasses. The monofilament exam was deferred during this visit.  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 He reports being separated from his wife and having to pay child support for his three children (99, 60, and 76 years old).       Review of Systems  Endocrine: Negative for polydipsia and polyuria.  Neurological:  Negative  for numbness.    Epic A Jalien Jansky  has a past medical history of Anemia, Arthritis, Chronic kidney disease, Diabetes ([place]), Hyperlipidemia, Hypertension, Medication monitoring encounter (07/17/2016), and Sleep apnea.  Family History, Social History, current Medications and allergies reviewed and updated in Epic.  Objective:    Blood pressure 124/78, pulse 86, temperature 97.9 F (36.6 C), temperature source Oral, resp. rate 16, height 5' 5"$  (1.651 m), weight 160 lb 14.4 oz (73 kg), SpO2 98 %. Physical Exam      Data : I have personally reviewed pertinent labs and imaging studies, if indicated,  with the patient in clinic today.   Lab Orders  No laboratory test(s) ordered today    HC Readings from Last 3 Encounters:  No data found for [facility]  Wt Readings from Last 3 Encounters:  05/17/22 160 lb 14.4 oz (73 kg)  03/22/22 165 lb (74.8 kg)  12/14/21 166 lb 3.2 oz (75.4 kg)

## 2022-05-22 ENCOUNTER — Ambulatory Visit: Payer: Self-pay | Admitting: Gerontology

## 2022-05-25 ENCOUNTER — Other Ambulatory Visit: Payer: Self-pay

## 2022-05-25 MED ORDER — EMPAGLIFLOZIN 10 MG PO TABS: 10.0000 mg | ORAL_TABLET | Freq: Every day | ORAL | 3 refills | Status: DC

## 2022-05-30 ENCOUNTER — Other Ambulatory Visit: Payer: Self-pay

## 2022-05-30 ENCOUNTER — Ambulatory Visit: Payer: Self-pay | Admitting: Gerontology

## 2022-05-30 ENCOUNTER — Encounter: Payer: Self-pay | Admitting: Gerontology

## 2022-05-30 VITALS — BP 124/78 | HR 93 | Temp 98.5°F | Resp 16 | Ht 65.0 in | Wt 158.1 lb

## 2022-05-30 DIAGNOSIS — E785 Hyperlipidemia, unspecified: Secondary | ICD-10-CM

## 2022-05-30 DIAGNOSIS — Z0000 Encounter for general adult medical examination without abnormal findings: Secondary | ICD-10-CM

## 2022-05-30 DIAGNOSIS — E119 Type 2 diabetes mellitus without complications: Secondary | ICD-10-CM

## 2022-05-30 NOTE — Progress Notes (Signed)
Established Patient Office Visit  Subjective   Patient ID: Hector Weber, male    DOB: 1970/05/31  Age: 52 y.o. MRN: [id number]  Chief Complaint  Patient presents with   Follow-up   Diabetes    Saw Endo Clinic on 05/17/22. Patient has increased Metformin as advised from Endo, but has not picked up the Jardiance from the pharmacy. Plans to pick up today.    Diabetes  HPI  Sanay Reznikov Roert Culligan is a 52 y/o male who has a history of anemia, arthritis, CKD, T2DM, hypertension, and hyperlipidemia presents for a follow up. He was recently seen by endocrinology and they prescribed Jardiance for him, but he has not picked this up from the pharmacy yet. He plans to pick it up today. Endocrinology also increased his Metformin to 1000 mg BID, which he states that he has been compliant with. He states that he checks his fasting blood glucose and they are generally between 145 and 160 mg/dl. He did not provide a log at his visit today. He denies hypo/hyperglycemic symptoms. He stays very busy with working two jobs, and so his diet and exercise are dependent upon that. He requests a flu shot today. Overall, he states that he is doing well and offers no further complaints.   Review of Systems  Constitutional: Negative.   HENT: Negative.    Eyes: Negative.   Respiratory: Negative.    Cardiovascular: Negative.   Gastrointestinal: Negative.   Genitourinary: Negative.   Musculoskeletal: Negative.   Skin: Negative.   Neurological: Negative.   Endo/Heme/Allergies: Negative.   Psychiatric/Behavioral: Negative.        Objective:     BP 124/78 (BP Location: Left Arm, Patient Position: Sitting, Cuff Size: Normal)   Pulse 93   Temp 98.5 F (36.9 C) (Oral)   Resp 16   Ht '5\' 5"'$  (1.651 m)   Wt 158 lb 1.6 oz (71.7 kg)   SpO2 95%   BMI 26.31 kg/m  BP Readings from Last 3 Encounters:  05/30/22 124/78  05/17/22 124/78  03/22/22 126/77   Wt Readings from Last 3 Encounters:  05/30/22 158 lb 1.6 oz  (71.7 kg)  05/17/22 160 lb 14.4 oz (73 kg)  03/22/22 165 lb (74.8 kg)      Physical Exam Constitutional:      Appearance: Normal appearance. [name] normal weight.  HENT:     Head: Normocephalic.  Eyes:     Pupils: Pupils are equal, round, and reactive to light.  Cardiovascular:     Rate and Rhythm: Normal rate and regular rhythm.     Pulses: Normal pulses.     Heart sounds: Normal heart sounds.  Pulmonary:     Effort: Pulmonary effort is normal.     Breath sounds: Normal breath sounds.  Abdominal:     General: Abdomen is flat. Bowel sounds are normal.     Palpations: Abdomen is soft.  Musculoskeletal:        General: Normal range of motion.     Cervical back: Normal range of motion and neck supple.  Skin:    General: Skin is warm and dry.     Capillary Refill: Capillary refill takes less than 2 seconds.  Neurological:     General: No focal deficit present.     Mental Status: [name] alert and oriented to person, place, and time. Mental status is at baseline.  Psychiatric:        Mood and Affect: Mood normal.  Behavior: Behavior normal.        Thought Content: Thought content normal.        Judgment: Judgment normal.     Last CBC Lab Results  Component Value Date   WBC 5.1 09/07/2021   HGB 16.2 09/07/2021   HCT 47.8 09/07/2021   MCV 92 09/07/2021   MCH 31.1 09/07/2021   RDW 12.4 09/07/2021   PLT 220 [id number]   Last metabolic panel Lab Results  Component Value Date   GLUCOSE 238 (H) 09/07/2021   NA 139 09/07/2021   K 4.1 09/07/2021   CL 100 09/07/2021   CO2 24 09/07/2021   BUN 17 09/07/2021   CREATININE 0.62 (L) 09/07/2021   EGFR 117 09/07/2021   CALCIUM 9.5 09/07/2021   PROT 7.4 09/07/2021   ALBUMIN 4.8 09/07/2021   LABGLOB 2.6 09/07/2021   AGRATIO 1.8 09/07/2021   BILITOT 0.4 09/07/2021   ALKPHOS 112 09/07/2021   AST 15 09/07/2021   ALT 30 09/07/2021   Last lipids Lab Results  Component Value Date   CHOL 152 09/07/2021   HDL 34 (L)  09/07/2021   LDLCALC 64 09/07/2021   TRIG 343 (H) 09/07/2021   CHOLHDL 4.5 09/07/2021   Last hemoglobin A1c Lab Results  Component Value Date   HGBA1C 10.0 (A) 03/22/2022   Last thyroid functions Lab Results  Component Value Date   TSH 1.200 05/20/2018   Last vitamin D No results found for: "25OHVITD2", "25OHVITD3", "VD25OH" Last vitamin B12 and Folate No results found for: "VITAMINB12", "FOLATE"    The 10-year ASCVD risk score (Arnett DK, et al., 2019) is: 16%    Assessment & Plan:  1. Type 2 diabetes mellitus without complication, without long-term current use of insulin ([place]) - He was advised to pick up Jardiance from the pharmacy and begin taking ASAP.  - We will check A1C in one month.  - He was encouraged to follow low carbohydrate/low concentrated sugar diet and to continue checking his blood glucose levels at home. - He was supplied with more test strips.  - HgB A1c; Future  2. Hyperlipidemia LDL goal <70 - He will continue on his current medication and we will recheck labs in one month. - He was encouraged to follow a low fat diet and exercise as tolerated. - Lipid panel; Future  3. Health care maintenance - He was given a flu vaccine today.  - Flu Vaccine QUAD 6+ mos PF IM (Fluarix Quad PF)    He will follow up in clinic 07/03/22 or sooner if his symptoms worsen.   Eloise Harman, FNP Student

## 2022-05-30 NOTE — Patient Instructions (Signed)
Plan de alimentacin DASH DASH Eating Plan DASH es la sigla en ingls de "Enfoques Alimentarios para Detener la Hipertensin". El plan de alimentacin DASH ha demostrado: Bajar la presin arterial elevada (hipertensin). Reducir el riesgo de diabetes tipo 2, enfermedad cardaca y accidente cerebrovascular. Ayudar a perder peso. Consejos para seguir Photographer las etiquetas de los alimentos Verifique la cantidad de sal (sodio) por porcin en las etiquetas de los alimentos. Elija alimentos con menos del 5 por ciento del valor diario de sodio. Generalmente, los alimentos con menos de 300 miligramos (mg) de sodio por porcin se encuadran dentro de este plan alimentario. Para encontrar cereales integrales, busque la palabra "integral" como primera palabra en la lista de ingredientes. Al ir de compras Compre productos en los que en su etiqueta diga: "bajo contenido de sodio" o "sin agregado de sal". Compre alimentos frescos. Evite los alimentos enlatados y comidas precocidas o congeladas. Al cocinar Evite agregar sal cuando cocine. Use hierbas o aderezos sin sal, en lugar de sal de mesa o sal marina. Consulte al mdico o farmacutico antes de usar sustitutos de la sal. No fra los alimentos. A la hora de cocinar los alimentos opte por hornearlos, hervirlos, grillarlos, asarlos al horno y asarlos a Administrator, arts. Cocine con aceites cardiosaludables, como oliva, canola, aguacate, soja o girasol. Planificacin de las comidas  Consuma una dieta equilibrada, que incluya lo siguiente: 4 o ms porciones de frutas y 4 o ms porciones de Set designer. Trate de que medio plato de cada comida sea de frutas y verduras. De 6 a 8 porciones de [employer]. Menos de 6 onzas (170 g) de carne, aves o pescado Games developer. Una porcin de 3 onzas (85 g) de carne tiene casi el mismo tamao que un mazo de cartas. Un huevo equivale a 1 onza (28 g). De 2 a 3 porciones de productos lcteos  descremados por da. Una porcin es 1 taza (237 ml). 1 porcin de frutos secos, semillas o frijoles 5 veces por semana. De 2 a 3 porciones de grasas cardiosaludables. Las grasas saludables llamadas cidos grasos omega-3 se encuentran en alimentos como las nueces, las semillas de [place], las leches fortificadas y [place]. Estas grasas tambin se encuentran en los pescados de agua fra, como la sardina, el salmn y la caballa. Limite la cantidad que consume de: Alimentos enlatados o envasados. Alimentos con alto contenido de grasa trans, como algunos alimentos fritos. Alimentos con alto contenido de grasa saturada, como carne con grasa. Postres y otros dulces, bebidas azucaradas y otros alimentos con azcar agregada. Productos lcteos enteros. No le agregue sal a los alimentos antes de probarlos. No coma ms de 4 yemas de huevo por semana. Trate de comer al menos 2 comidas vegetarianas por semana. Consuma ms comida casera y menos de restaurante, de bares y comida rpida. Estilo de vida Cuando coma en un restaurante, pida que preparen su comida con menos sal o, en lo posible, sin nada de sal. Si bebe alcohol: Limite la cantidad que bebe: De 0 a 1 medida por da para las mujeres que no estn embarazadas. De 0 a 2 medidas por da para los hombres. Est atento a la cantidad de alcohol que hay en las bebidas que toma. En los [place], una medida equivale a una botella de cerveza de 12 oz (355 ml), un vaso de vino de 5 oz (148 ml) o un vaso de una bebida alcohlica de alta graduacin de 1 oz (  44 ml). Informacin general Evite ingerir ms de 2300 mg de sal por da. Si tiene hipertensin, es posible que necesite reducir la ingesta de sodio a 1,500 mg por da. Trabaje con su mdico para mantener un peso saludable o perder [employer]. Pregntele cul es el peso recomendado para usted. Realice al menos 30 minutos de ejercicio que haga que se acelere su corazn (ejercicio Arboriculturist) la [employer]  de la [place]. Estas actividades pueden incluir caminar, nadar o andar en bicicleta. Trabaje con su mdico o nutricionista para ajustar su plan alimentario a sus necesidades calricas personales. Qu alimentos debo comer? Frutas Todas las frutas frescas, congeladas o disecadas. Frutas enlatadas en jugo natural (sin agregado de azcar). Verduras Verduras frescas o congeladas (crudas, al vapor, asadas o grilladas). Jugos de tomate y verduras con bajo contenido de sodio o reducidos en sodio. Salsa y pasta de tomate con bajo contenido de sodio o reducidas en sodio. Verduras enlatadas con bajo contenido de sodio o reducidas en sodio. Granos Pan de salvado o integral. Pasta de salvado o integral. Arroz integral. Avena. Quinua. Trigo burgol. Cereales integrales y con bajo contenido de sodio. Pan pita. Galletitas de [place] con bajo contenido de [place] y [place]. Tortillas de [place] integral. Carnes y otras protenas Pollo o pavo sin piel. Carne de pollo o de [place]. Cerdo desgrasado. Pescado y [employer]. Claras de huevo. Porotos, guisantes o lentejas secos. Frutos secos, mantequilla de frutos secos y semillas sin sal. Frijoles enlatados sin sal. Cortes de carne vacuna magra, desgrasada. Carne precocida o curada magra y baja en sodio, como embutidos o panes de carne. Lcteos Leche descremada (1 %) o descremada. Quesos reducidos en grasa, con bajo contenido de grasa o descremados. Queso blanco o ricota sin grasa, con bajo contenido de [place]. Yogur semidescremado o descremado. Queso con bajo contenido de [place] y [place]. Grasas y [employer] untables que no contengan grasas trans. Aceite vegetal. Lubertha Basque y aderezos para ensaladas livianos, reducidos en grasa o con bajo contenido de grasas (reducidos en sodio). Aceite de canola, crtamo, oliva, aguacate, soja y [place]. [place]. Alios y condimentos Hierbas. Especias. Mezclas de condimentos sin sal. Otros alimentos Palomitas de maz y pretzels sin sal.  Dulces con bajo contenido de grasas. Es posible que los productos que se enumeran ms [place] no constituyan una lista completa de los alimentos y las bebidas que puede tomar. Consulte a un nutricionista para obtener ms informacin. Qu alimentos debo evitar? Lambert Mody Fruta enlatada en almbar liviano o espeso. Frutas cocidas en aceite. Frutas con salsa de crema o mantequilla. Verduras Verduras con crema o fritas. Verduras en [place]. Verduras enlatadas regulares (que no sean con bajo contenido de sodio o reducidas en sodio). Pasta y salsa de tomates enlatadas regulares (que no sean con bajo contenido de sodio o reducidas en sodio). Jugos de tomate y verduras regulares (que no sean con bajo contenido de sodio o reducidos en sodio). Pepinillos. Aceitunas. Granos Productos de panificacin hechos con grasa, como medialunas, magdalenas y algunos panes. Comidas con arroz o pasta seca listas para usar. Carnes y otras protenas Cortes de carne con alto contenido de Lobbyist. Costillas. Carne frita. [place]. [place], salame y otras carnes precocidas o curadas, como embutidos o panes de carne. Grasa de la espalda del cerdo (panceta). [place]. Frutos secos y semillas con sal. Frijoles enlatados con agregado de sal. Pescado enlatado o ahumado. Huevos enteros o yemas. Pollo o pavo con piel. Lcteos Leche entera o al 2 %, crema  y [place] y mitad crema. Queso crema entero o con toda su grasa. Yogur entero o endulzado. Quesos con toda su grasa. Sustitutos de cremas no lcteas. Coberturas batidas. Quesos para untar y quesos procesados. Grasas y [employer]. Margarina en barra. [place]. Lardo. Mantequilla clarificada. Grasa de panceta. Aceites tropicales como aceite de coco, palmiste o palma. Alios y condimentos Sal de cebolla, sal de ajo, sal condimentada, sal de mesa y sal marina. Salsa [place]. Salsa trtara. Salsa barbacoa. Salsa teriyaki. Salsa de soja, incluso la que  tiene contenido reducido de [place]. Salsa de carne. Salsas en lata y envasadas. Salsa de pescado. Salsa de [place]. Salsa rosada. Rbanos picantes comprados en tiendas. [place]. Mostaza. Saborizantes y tiernizantes para carne. Caldo en cubitos. Salsas picantes. Adobos preelaborados o envasados. Aderezos para tacos preelaborados o envasados. Salsas de pepinillos. Aderezos comunes para ensalada. Otros alimentos Palomitas de maz y pretzels con sal. Es posible que los productos que se enumeran ms arriba no constituyan una lista completa de los alimentos y las bebidas que Nurse, adult. Consulte a un nutricionista para obtener ms informacin. Dnde buscar ms informacin National Heart, Lung, and Blood Institute ([place], los Pulmones y Herbalist): https://wilson-eaton.com/ American Heart Association (Asociacin Estadounidense del Corazn): www.heart.org Academy of Nutrition and Dietetics (Academia de Nutricin y Information systems manager): www.eatright.[place] www.kidney.org Resumen El plan de alimentacin DASH ha demostrado bajar la presin arterial elevada (hipertensin). Tambin puede reducir [employer] de diabetes tipo 2, enfermedad cardaca y accidente cerebrovascular. Cuando siga el plan de alimentacin DASH, trate de comer ms frutas frescas y verduras, cereales integrales, carnes magras, lcteos descremados y grasas cardiosaludables. Con el plan de alimentacin DASH, deber limitar el consumo de sal (sodio) a 2,300 mg por da. Si tiene hipertensin, es posible que necesite reducir la ingesta de sodio a 1,500 mg por da. Trabaje con su mdico o nutricionista para ajustar su plan alimentario a sus necesidades calricas personales. Esta informacin no tiene Marine scientist el consejo del mdico. Asegrese de hacerle al mdico cualquier pregunta que tenga. Document Revised: 04/30/2019 Document Reviewed: 04/30/2019 Elsevier Patient Education   [place].

## 2022-06-14 ENCOUNTER — Ambulatory Visit: Payer: Self-pay | Admitting: Gerontology

## 2022-07-03 ENCOUNTER — Other Ambulatory Visit: Payer: Self-pay

## 2022-07-03 ENCOUNTER — Encounter: Payer: Self-pay | Admitting: Gerontology

## 2022-07-03 ENCOUNTER — Ambulatory Visit: Payer: Self-pay | Admitting: Gerontology

## 2022-07-03 VITALS — BP 129/81 | HR 80 | Temp 98.1°F | Resp 16 | Ht 65.0 in | Wt 157.2 lb

## 2022-07-03 DIAGNOSIS — E785 Hyperlipidemia, unspecified: Secondary | ICD-10-CM

## 2022-07-03 DIAGNOSIS — I10 Essential (primary) hypertension: Secondary | ICD-10-CM

## 2022-07-03 DIAGNOSIS — Z0000 Encounter for general adult medical examination without abnormal findings: Secondary | ICD-10-CM

## 2022-07-03 DIAGNOSIS — E119 Type 2 diabetes mellitus without complications: Secondary | ICD-10-CM

## 2022-07-03 LAB — POCT GLYCOSYLATED HEMOGLOBIN (HGB A1C): Hemoglobin A1C: 7.9 % — AB (ref 4.0–5.6)

## 2022-07-03 LAB — GLUCOSE, POCT (MANUAL RESULT ENTRY): POC Glucose: 139 mg/dl — AB (ref 70–99)

## 2022-07-03 MED ORDER — GLIPIZIDE ER 10 MG PO TB24: 10.0000 mg | ORAL_TABLET | Freq: Every day | ORAL | 3 refills | Status: DC

## 2022-07-03 MED ORDER — CARVEDILOL 12.5 MG PO TABS: ORAL_TABLET | ORAL | 3 refills | Status: DC

## 2022-07-03 MED ORDER — ATORVASTATIN CALCIUM 40 MG PO TABS: 40.0000 mg | ORAL_TABLET | Freq: Every day | ORAL | 3 refills | Status: DC

## 2022-07-03 MED ORDER — METFORMIN HCL 1000 MG PO TABS: ORAL_TABLET | ORAL | 3 refills | Status: DC

## 2022-07-03 NOTE — Progress Notes (Signed)
Established Patient Office Visit  Subjective   Patient ID: Hector Weber, male    DOB: [birth year]  Age: 52 y.o. MRN: [id number]  Chief Complaint  Patient presents with   Follow-up   Diabetes    Patient states he is unable to tolerate the Jardiance. He states he made him feel "real sad"    Diabetes Pertinent negatives for hypoglycemia include no dizziness, headaches or nervousness/anxiousness. Pertinent negatives for diabetes include no blurred vision, no chest pain, no polydipsia and no weight loss.   Hector Weber is a 52 y/o male who has a history of T2DM, hypertension, and hyperlipidemia presents for a follow up visit. He was seen by endocrinology in February, and they prescribed Jardiance for him. He started it, but stopped after 4 doses, stating it make him feel "really bad." He states he checked his blood sugar once while feeling that way, thinks it was in the 40's. These side effects improved once he stopped the medication. His HgbA1c checked during visit decreased from 10% to 7.9% and his blood glucose was 139 mg/dl.  Endocrinology also increased his Metformin to 1000 mg BID, which he states that he has been compliant with. He states that he checks his fasting blood glucose twice a day, once in am and once in evenings, and they are generally between 130 and 170 mg/dl. He did not bring his blood glucose log to the visit today, but keeps track at home. He denies hypo/hyperglycemic symptoms. He checks his feet daily, denies having sores or wounds at this time. He has improved his diet and watches his salt, sugar, fat intake. He drinks mainly water or hot tea with peppermint and chamomile from his yard. He stays physically active at his two jobs, working in [employer] and in [employer].   He wears his glasses at home when watching TV. Last eye exam was "some time ago." Denies blurred vision, dizziness, headache. States his blood pressures are normally good at home. Overall,  he states that he is doing well and offers no further complaints.     Review of Systems  Constitutional: Negative.  Negative for chills, fever and weight loss.  Eyes:  Negative for blurred vision, double vision and pain.  Respiratory:  Negative for cough and shortness of breath.   Cardiovascular:  Negative for chest pain and palpitations.  Gastrointestinal:  Negative for abdominal pain, blood in stool, diarrhea, heartburn, nausea and vomiting.  Genitourinary:  Negative for frequency and urgency.  Musculoskeletal:  Negative for myalgias.  Skin:  Negative for itching and rash.  Neurological:  Negative for dizziness, sensory change and headaches.  Endo/Heme/Allergies:  Negative for polydipsia.  Psychiatric/Behavioral:  Negative for depression, substance abuse and suicidal ideas. The patient is not nervous/anxious.       Objective:     BP 129/81 (BP Location: Right Arm, Patient Position: Sitting, Cuff Size: Large)   Pulse 80   Temp 98.1 F (36.7 C) (Oral)   Resp 16   Ht 5\' 5"  (1.651 m)   Wt 157 lb 3.2 oz (71.3 kg)   SpO2 96%   BMI 26.16 kg/m  BP Readings from Last 3 Encounters:  07/03/22 129/81  05/30/22 124/78  05/17/22 124/78   Wt Readings from Last 3 Encounters:  07/03/22 157 lb 3.2 oz (71.3 kg)  05/30/22 158 lb 1.6 oz (71.7 kg)  05/17/22 160 lb 14.4 oz (73 kg)      Physical Exam Vitals and nursing note reviewed.  Constitutional:      General: [name] not in acute distress.    Appearance: Normal appearance. [name] normal weight. [name] not ill-appearing.  Eyes:     Pupils: Pupils are equal, round, and reactive to light.  Cardiovascular:     Rate and Rhythm: Normal rate and regular rhythm.     Pulses: Normal pulses.     Heart sounds: Normal heart sounds. No murmur heard.    No friction rub. No gallop.  Pulmonary:     Effort: Pulmonary effort is normal.     Breath sounds: No wheezing, rhonchi or rales.  Musculoskeletal:        General: Normal range of motion.   Skin:    General: Skin is warm and dry.  Neurological:     General: No focal deficit present.     Mental Status: [name] alert and oriented to person, place, and time.  Psychiatric:        Mood and Affect: Mood normal.        Behavior: Behavior normal.        Thought Content: Thought content normal.        Judgment: Judgment normal.      Results for orders placed or performed in visit on 07/03/22  POCT Glucose (CBG)  Result Value Ref Range   POC Glucose 139 (A) 70 - 99 mg/dl  POCT HgB A1C  Result Value Ref Range   Hemoglobin A1C 7.9 (A) 4.0 - 5.6 %   HbA1c POC (<> result, manual entry)     HbA1c, POC (prediabetic range)     HbA1c, POC (controlled diabetic range)      Last CBC Lab Results  Component Value Date   WBC 5.1 09/07/2021   HGB 16.2 09/07/2021   HCT 47.8 09/07/2021   MCV 92 09/07/2021   MCH 31.1 09/07/2021   RDW 12.4 09/07/2021   PLT 220 [id number]   Last metabolic panel Lab Results  Component Value Date   GLUCOSE 238 (H) 09/07/2021   NA 139 09/07/2021   K 4.1 09/07/2021   CL 100 09/07/2021   CO2 24 09/07/2021   BUN 17 09/07/2021   CREATININE 0.62 (L) 09/07/2021   EGFR 117 09/07/2021   CALCIUM 9.5 09/07/2021   PROT 7.4 09/07/2021   ALBUMIN 4.8 09/07/2021   LABGLOB 2.6 09/07/2021   AGRATIO 1.8 09/07/2021   BILITOT 0.4 09/07/2021   ALKPHOS 112 09/07/2021   AST 15 09/07/2021   ALT 30 09/07/2021   Last lipids Lab Results  Component Value Date   CHOL 152 09/07/2021   HDL 34 (L) 09/07/2021   LDLCALC 64 09/07/2021   TRIG 343 (H) 09/07/2021   CHOLHDL 4.5 09/07/2021   Last hemoglobin A1c Lab Results  Component Value Date   HGBA1C 7.9 (A) 07/03/2022      The 10-year ASCVD risk score (Arnett DK, et al., 2019) is: 17%    Assessment & Plan:  1. Type 2 diabetes mellitus without complication, without long-term current use of insulin (HCC) -  His diabetes is improving, A1C was 7.9%,but still not at goal of < 7%. He was advised to stop Jardiance.   - - advised to continue low carb/low concentrated sweet diet and continue metformin, glipizide.  - needs retinal exam, will refer to screening on 07/25/22.  - POCT Glucose (CBG) - POCT HgB A1C - Lipid Profile; Future - glipiZIDE (GLUCOTROL XL) 10 MG 24 hr tablet; Take 1 tablet (10 mg total) by mouth daily  with breakfast.  Dispense: 30 tablet; Refill: 3 - metFORMIN (GLUCOPHAGE) 1000 MG tablet; TAKE 1 TABLET BY MOUTH TWICE A DAY WITH A MEAL  Dispense: 60 tablet; Refill: 3 - Lipid Profile  2. Essential hypertension - His blood pressure is at goal. Advised to continue carvedilol and follow DASH/low salt diet. - carvedilol (COREG) 12.5 MG tablet; Take 12.5 mg twice daily  Dispense: 60 tablet; Refill: 3  3. Hyperlipidemia LDL goal <70 The 10-year ASCVD risk score (Arnett DK, et al., 2019) is: 17%   Values used to calculate the score:     Age: 68 years     Sex: Male     Is Non-Hispanic African American: No     Diabetic: Yes     Tobacco smoker: Yes     Systolic Blood Pressure: [id number] mmHg     Is BP treated: Yes     HDL Cholesterol: 34 mg/dL     Total Cholesterol: 152 mg/dL - will recheck lipids today to see if his ASCVD score is improved. - Advised to continue lipitor in combination with low fat dietary modifications. - Lipid Profile; Future - atorvastatin (LIPITOR) 40 MG tablet; Take 1 tablet (40 mg total) by mouth daily.  Dispense: 30 tablet; Refill: 3 - Lipid Profile  4. Adult health maintenance - due for colonoscopy - Will give stool card today. - due for annual labs at next visit.  Return in about 3 months (around 10/03/2022), or if symptoms worsen or fail to improve.    Chioma Jerold Coombe, NP

## 2022-07-03 NOTE — Patient Instructions (Signed)
Plan de alimentacin cardiosaludable Heart-Healthy Eating Plan Llevar una dieta saludable es importante para la salud del corazn. Un plan de alimentacin cardiosaludable incluye: Consumir menos grasas no saludables. Consumir ms grasas saludables. Consumir menos sal en los alimentos. La sal tambin se denomina sodio. Realizar otros cambios en su dieta. Hable con el mdico o con un especialista en alimentacin (nutricionista) para crear un plan de alimentacin que sea adecuado para usted. En qu consiste el plan? El mdico puede recomendarle un plan de alimentacin que incluya lo siguiente: Grasas totales: ______% o menos del total de caloras por da. Grasas saturadas: ______% o menos del total de caloras por da. Colesterol: menos de _________mg Hector Weber. Sodio: menos de ________ mg al [employer]. Cules son algunos consejos para seguir este plan? Al cocinar Evite frer los alimentos. En cambio, trate de cocinarlos en el horno, en la plancha o en la parrilla, o hervirlos. Tambin puede reducir las grasas de la siguiente forma: Quite la piel de las aves. Quite todas las grasas visibles de las carnes. Cocine al vapor las verduras en agua o caldo. Planificacin de las comidas  En las comidas, [place] su plato en cuatro partes iguales: Llene la mitad del plato con verduras y ensaladas de hojas verdes. Llene un cuarto del plato con cereales integrales. Llene un cuarto del plato con alimentos con protenas magras. De 2 a 4 tazas de Set designer. Una taza de verduras es: 1 taza (91 g) de brcoli o coliflor. 2 zanahorias medianas. 1 pimiento morrn grande. 1 batata grande. 1 tomate grande. 1 papa blanca mediana. 2 tazas (150 g) de verduras de hojas verdes crudas. Coma de 1 a 2 tazas de Probation officer. Una taza de fruta es: 1 manzana pequea. 1 banana grande 1 taza (237 g) de fruta mezclada, 1 naranja grande,  taza (82 g) de frutas disecadas. 1 taza (240 ml) de jugo 100 % de  frutas. Consuma ms alimentos que tengan fibra soluble. Ellos son las [place], el brcoli, las [place], los frijoles, los guisantes y Nature conservation officer. Trate de consumir de 20 a 30 g de [employer]. Coma 4 o 5 porciones de frutos secos, legumbres y semillas por semana: 1 porcin de frijoles o legumbres secos equivale a  de taza (90 g) cocinados. 1 porcin de frutos secos es  oz (12 almendras, 24 pistachos o 7 mitades de nueces). 1 porcin de semillas equivale a  oz (8 g). Informacin general Coma ms comidas caseras. Coma menos comida de restaurantes, bufs y comida rpida. Limite o evite el alcohol. Limite los alimentos con alto contenido de almidn y Location manager. Evite las comidas fritas. Baje de peso si es necesario. Intente llevar un registro de la cantidad de sal (sodio) que ingiere. Esto es importante si tiene presin arterial alta. [place] a su mdico que le d ms informacin al respecto. Trate de incorporar comidas vegetarianas cada semana. [place] grasas saludables. Estas incluyen aceite de oliva y de canola, semillas de lino, nueces, almendras y semillas. Consuma ms grasas omega-3. Estas incluyen salmn, caballa, sardinas, atn, aceite de lino y semillas de lino molidas. Intente comer pescado al [facility] veces por semana. Lea las etiquetas de los alimentos. Evite los alimentos que contienen grasas trans o altas cantidades de grasas saturadas. Limite el consumo de grasas saturadas. Estas se encuentran frecuentemente en productos de origen animal, como carnes, mantequilla y crema. Tambin se encuentran en alimentos de origen vegetal, como el aceite de palma, el aceite de  palmiste y el aceite de Weber. Evite los alimentos con aceites parcialmente hidrogenados. Estos tienen grasas trans. [address] [place], se incluyen margarinas en barra, algunas margarinas untables, galletas dulces y [place] y otros productos horneados. Qu alimentos debo comer? Lambert Mody Lambert Mody frescas, en  conserva (en su jugo natural) o frutas congeladas. Verduras Verduras frescas o congeladas (crudas, al vapor, asadas o grilladas). Ensaladas de hojas verdes. Cereales La mayora de los cereales. Elija casi siempre trigo integral y Psychologist, prison and probation services. Arroz y pastas, incluido el arroz integral y las pastas elaboradas con trigo integral. Estate agent y otras protenas Carnes magras de res, ternera, cerdo y cordero a las que se les haya quitado la grasa visible. Pollo y pavo sin piel. Todos los pescados y [employer]. Pato salvaje, conejo, faisn y venado. Claras de huevo o sustitutos del huevo bajos en colesterol. Porotos, guisantes y lentejas secos y tofu. Semillas y la mayora de los frutos secos. Lcteos Quesos descremados y semidescremados, entre ellos, ricota y Artist. Leche descremada o al 1 % que sea lquida, en polvo o evaporada. [place]. Yogur descremado o semidescremado. Grasas y Naval architect no hidrogenadas (sin grasas trans). Aceites vegetales, incluido el de soja, ssamo, girasol, [place], man, crtamo, maz, canola y semillas de algodn. Alios para ensalada o mayonesa elaborados con aceite vegetal. Bebidas Agua mineral. T y caf. Gaseosas dietticas. Dulces y [employer], gelatina y helado de frutas. Pequeas cantidades de chocolate amargo. Limite todos los dulces y postres. Alios y [employer] y condimentos. Es posible que los productos que se enumeran ms [place] no constituyan una lista completa de los alimentos y las bebidas que puede tomar. Consulte a un nutricionista para conocer ms opciones. Qu alimentos debo evitar? Lambert Mody Fruta enlatada en almbar espeso. Frutas con salsa de crema o mantequilla. Frutas fritas. Limite el consumo de Weber. Verduras Verduras cocinadas con salsas de queso, crema o mantequilla. Verduras fritas. Cereales Panes elaborados con grasas saturadas o trans, aceites o [employer].  Croissants. Panecillos dulces. Rosquillas. Galletas con alto contenido de grasas, como las que contienen [place]. Carnes y otras protenas Carnes grasas, como perros calientes, [place] de res, salchichas, tocino, asado de Engineer, structural o [place]. Fiambres con alto contenido de [place], como salame y Archivist. Caviar. Pato y ganso domsticos. Vsceras, como hgado. Lcteos Crema, crema agria, queso crema y [place] cottage con crema. Quesos enteros. Leche entera o al 2 % que sea [place], evaporada o condensada. Suero de [employer]. Salsa de crema o queso con alto contenido de [place]. Yogur elaborado con Mattel. Grasas y [place] de carne o materia grasa. Manteca de cacao, aceites hidrogenados, aceite de palma, aceite de Weber, aceite de palmiste. Grasas y materias grasas slidas, incluida la grasa del tocino, el cerdo [place], la [place] de cerdo y Community education officer. Sustitutos no lcteos de la crema. Aderezos para ensalada con queso o crema agria. Bebidas Refrescos regulares y jugos con agregado de Location manager. Dulces y [employer]. Pudin. Galletas dulces. Bizcochuelos. Pasteles. Chocolate con leche o chocolate blanco. Almbares con mantequilla. Helados o bebidas elaboradas con helado con alto contenido de grasas. Esta no es [employer] de los alimentos y las bebidas que se Higher education careers adviser. Consulte a un nutricionista para obtener ms informacin. Resumen La planificacin de las comidas cardiosaludables incluye comer menos grasas poco saludables, comer ms grasas saludables y hacer otros cambios en su dieta. Siga una dieta equilibrada. Esta incluye frutas y verduras, productos lcteos descremados o semidescremados, protenas [place],  frutos secos y legumbres, cereales integrales y aceites y grasas cardiosaludables. Esta informacin no tiene Marine scientist el consejo del mdico. Asegrese de hacerle al mdico cualquier pregunta que tenga. Document Revised: 05/17/2021 Document Reviewed:  05/17/2021 Elsevier Patient Education  [place].

## 2022-07-04 LAB — LIPID PANEL
Chol/HDL Ratio: 2.4 ratio (ref 0.0–5.0)
Cholesterol, Total: 115 mg/dL (ref 100–199)
HDL: 48 mg/dL (ref >39)
LDL Chol Calc (NIH): 57 mg/dL (ref 0–99)
Triglycerides: 37 mg/dL (ref 0–149)
VLDL Cholesterol Cal: 10 mg/dL (ref 5–40)

## 2022-09-05 ENCOUNTER — Other Ambulatory Visit: Payer: Self-pay

## 2022-10-03 ENCOUNTER — Ambulatory Visit: Payer: Self-pay | Admitting: Gerontology

## 2022-10-04 ENCOUNTER — Ambulatory Visit: Payer: Self-pay | Admitting: Gerontology

## 2022-10-04 VITALS — BP 148/79 | HR 82 | Temp 98.5°F | Wt 160.2 lb

## 2022-10-04 DIAGNOSIS — I10 Essential (primary) hypertension: Secondary | ICD-10-CM

## 2022-10-04 DIAGNOSIS — E119 Type 2 diabetes mellitus without complications: Secondary | ICD-10-CM

## 2022-10-04 DIAGNOSIS — E785 Hyperlipidemia, unspecified: Secondary | ICD-10-CM

## 2022-10-04 DIAGNOSIS — Z0000 Encounter for general adult medical examination without abnormal findings: Secondary | ICD-10-CM

## 2022-10-04 LAB — POCT GLYCOSYLATED HEMOGLOBIN (HGB A1C): Hemoglobin A1C: 8 % — AB (ref 4.0–5.6)

## 2022-10-04 LAB — GLUCOSE, POCT (MANUAL RESULT ENTRY): POC Glucose: 120 mg/dl — AB (ref 70–99)

## 2022-10-04 MED ORDER — CARVEDILOL 12.5 MG PO TABS
ORAL_TABLET | ORAL | 3 refills | Status: DC
Start: 2022-10-04 — End: 2023-04-18

## 2022-10-04 MED ORDER — GLIPIZIDE ER 10 MG PO TB24: 10.0000 mg | ORAL_TABLET | Freq: Every day | ORAL | 3 refills | Status: DC

## 2022-10-04 MED ORDER — METFORMIN HCL 1000 MG PO TABS: ORAL_TABLET | ORAL | 3 refills | Status: DC

## 2022-10-04 MED ORDER — ATORVASTATIN CALCIUM 40 MG PO TABS: 40.0000 mg | ORAL_TABLET | Freq: Every day | ORAL | 3 refills | Status: DC

## 2022-10-04 NOTE — Patient Instructions (Signed)
Plan de alimentacin DASH DASH Eating Plan DASH es la sigla en ingls de "Enfoques alimentarios para detener la hipertensin" (Dietary Approaches to Stop Hypertension). El plan de alimentacin DASH ha demostrado: [place] la presin arterial alta (hipertensin). Reducir el riesgo de diabetes tipo 2, enfermedad cardaca y accidente cerebrovascular. Ayudar a perder peso. Consejos para seguir este plan Leer las etiquetas de los alimentos Verifique la cantidad de sal (sodio) por porcin en las etiquetas de los alimentos. Elija alimentos con menos del 5 por ciento del valor diario (VD) de sodio. En general, los alimentos con menos de 300 miligramos (mg) de sodio por porcin se encuadran dentro de este plan alimentario. Para encontrar cereales integrales, busque la palabra "integral" como primera palabra en la lista de ingredientes. Al ir de compras Compre productos en los que en su etiqueta diga: "bajo contenido de sodio" o "sin sal agregada". Compre alimentos frescos. Evite los alimentos enlatados y comidas precocidas o congeladas. Al cocinar Trate de no agregar sal cuando cocine. Use hierbas o aderezos sin sal, en lugar de sal de mesa o sal marina. Consulte al mdico o farmacutico antes de usar sustitutos de la sal. No fra los alimentos. A la hora de cocinar los alimentos, opte por hornearlos, hervirlos, grillarlos, asarlos al horno o a la parrilla. Cocine con aceites que sean buenos para el corazn. Estos incluyen el aceite de oliva, canola, aguacate, soja y girasol. Planificacin de las comidas  Siga una dieta equilibrada. Esto debe incluir lo siguiente: 4 o ms porciones de frutas y 4 o ms porciones de verduras por da. Trate de que medio plato de cada comida sea de frutas y verduras. De 6 a 8 porciones de cereales integrales todos los das. 6 o menos porciones de carne magra, ave o pescado por da. 1 onza es 1 porcin. Una porcin de 3 onzas (85 g) de carne tiene casi el mismo tamao que la  palma de la mano. Un huevo es 1 onza (28 g). De 2 a 3 porciones de productos lcteos descremados por da. Una porcin es 1 taza (237 ml). 1 porcin de frutos secos, semillas o frijoles 5 veces por semana. De 2 a 3 porciones de grasas cardiosaludables. Las grasas saludables llamadas cidos grasos omega-3 se encuentran en alimentos como las nueces, las semillas de lino, las leches fortificadas y los huevos. Estas grasas tambin se encuentran en los pescados de agua fra, como la sardina, el salmn y la caballa. Limite la cantidad que consume de: Alimentos enlatados o envasados. Alimentos con alto contenido de grasa trans, como alimentos fritos. Alimentos con alto contenido de grasa saturada, como carne con grasa. Postres y otros dulces, bebidas azucaradas y otros alimentos con azcar agregada. Productos lcteos enteros. No le agregue sal a los alimentos antes de probarlos. No coma ms de 4 yemas de huevo por semana. Trate de comer al menos 2 comidas vegetarianas por semana. Consuma ms comida casera y menos de restaurante, de bares y comida rpida. Estilo de vida Cuando coma en un restaurante, pida que preparen su comida con menos sal, o bien, sin nada de sal. Si bebe alcohol: Limite la cantidad que bebe a lo siguiente: De 0 a 1 medida al da si es mujer. De 0 a 2 medidas al da si es varn. Sepa cunta cantidad de alcohol hay en las bebidas que toma. En los [place], una medida es una botella de cerveza de 12 oz (355 ml), un vaso de vino de 5 oz (148 ml) o   un vaso de una bebida alcohlica de alta graduacin de 1 oz (44 ml). Informacin general Evite ingerir ms de 2,300 mg de sal por da. Si tiene hipertensin, es posible que necesite reducir la ingesta de sodio a 1,500 mg por da. Trabaje con el mdico para mantenerse en un peso saludable o para perder peso. Pregunte cul es el rango de peso recomendable para usted. La mayora de los das de la semana, realice al menos 30 minutos de  ejercicio que haga que se acelere su corazn. Estos pueden incluir caminar, nadar o andar en bicicleta. Trabaje con su mdico o nutricionista para ajustar su plan alimentario a sus necesidades calricas especficas. Qu alimentos debo comer? Frutas Todas las frutas frescas, congeladas o secas. Frutas enlatadas que estn en su jugo natural y que no tengan azcar agregada. Verduras Verduras frescas o congeladas crudas, cocidas al vapor, asadas o grilladas. Jugos de tomate y verduras con bajo contenido de sodio o reducidos en sodio. Salsa y pasta de tomate con bajo contenido de sodio o reducidas en sodio. Verduras enlatadas con bajo contenido de sodio o reducidas en sodio. Granos Pan de salvado o integral. Pasta de salvado o integral. Arroz integral. Avena. [place]. Trigo burgol. Cereales integrales y con bajo contenido de sodio. Pan pita. Galletitas de agua con bajo contenido de grasa y sodio. Tortillas de harina integral. Carnes y otras protenas Pollo o pavo sin piel. Carne de pollo o de pavo molida. Cerdo desgrasado. Pescado y mariscos. Claras de huevo. Porotos, guisantes o lentejas secos. Frutos secos, mantequilla de frutos secos y semillas sin sal. Frijoles enlatados sin sal. Cortes de carne vacuna magra, desgrasada. Carne precocida o curada magra y baja en sodio, como embutidos o panes de carne. Lcteos Leche descremada (1 %) o descremada (desnatada). Quesos reducidos en grasa, con bajo contenido de grasa o descremados. Queso blanco o ricota sin grasa, con bajo contenido de sodio. Yogur semidescremado o descremado. Queso con bajo contenido de grasa y sodio. Grasas y aceites Margarinas untables que no contengan grasas trans. Aceite vegetal. [place] y aderezos para ensaladas livianos, reducidos en grasa o con bajo contenido de grasas (reducidos en sodio). Aceite de canola, crtamo, oliva, aguacate, soja y girasol. [place]. Alios y condimentos Hierbas. Especias. Mezclas de condimentos sin  sal. Otros alimentos Palomitas de maz y pretzels sin sal. Dulces con bajo contenido de grasas. Es posible que los productos que se enumeran ms arriba no sean todos los alimentos y las bebidas que puede consumir. Consulte a un nutricionista para obtener ms informacin. Qu alimentos debo evitar? Frutas Fruta enlatada en almbar liviano o espeso. Frutas cocidas en aceite. Frutas con salsa de crema o mantequilla. Verduras Verduras con crema o fritas. Verduras en salsa de queso. Verduras enlatadas regulares que no sean con bajo contenido de sodio o reducidas en sodio. Pasta y salsa de tomates enlatadas regulares que no sean con bajo contenido de sodio o reducidas en sodio. Jugos de tomate y de verduras regulares que no sean con bajo contenido de sodio o reducidos en sodio. Pepinillos. Aceitunas. Granos Productos de panificacin hechos con grasa, como medialunas, magdalenas y algunos panes. Comidas con arroz o pasta seca listas para usar. Carnes y otras protenas Cortes de carne con alto contenido de grasa. Costillas. Carne frita. [place]. [place], salame y otras carnes precocidas o curadas, como embutidos o panes de carne, que no sean magros y que no sean con bajo contenido de sodio. Grasa de la espalda del cerdo (panceta). Salchicha de cerdo. Frutos secos   y semillas con sal. Frijoles enlatados con sal agregada. Pescado enlatado o ahumado. Huevos enteros o yemas. Pollo o pavo con piel. Lcteos Leche entera o al 2 %, crema y mitad leche y mitad crema. Queso crema entero o con toda su grasa. Yogur entero o endulzado. Quesos con toda su grasa. Sustitutos de cremas no lcteas. Coberturas batidas. Quesos para untar y quesos procesados. Grasas y aceites Mantequilla. Margarina en barra. Manteca de cerdo. Materia grasa. Mantequilla clarificada. Grasa de tocino. Aceites tropicales como aceite de coco, palmiste o palma. Alios y condimentos Sal de cebolla, sal de ajo, sal condimentada, sal de mesa y sal  marina. Salsa [place]. Salsa trtara. Salsa barbacoa. Salsa teriyaki. Salsa de soja, incluso la que tiene contenido reducido de sodio. Salsa de carne. Salsas en lata y envasadas. Salsa de pescado. Salsa de ostras. Salsa rosada. Rbanos picantes comprados en tiendas. [place]. Most[place]. Saborizantes y tiernizantes para carne. Caldo en cubitos. Salsas picantes. Adobos preelaborados o envasados. Aderezos para tacos preelaborados o envasados. Salsas. Aderezos comunes para ensalada. Otros alimentos Palomitas de maz y pretzels con sal. Es posible que los productos que se enumeran ms arriba no sean todos los alimentos y las bebidas que debe evitar. Consulte a un nutricionista para obtener ms informacin. Dnde obtener ms informacin National Heart, Lung, and Blood Institute (Instituto Nacional del Corazn, los Pulmones y la Sangre, NHLBI): nhlbi.nih.gov American Heart Association (Asociacin Estadounidense del Corazn, AHA): heart.org Academy of Nutrition and Dietetics (Academia de Nutricin y Diettica): eatright.org National Kidney Foundation (NKF) (Fundacin Nacional del Rin): kidney.org Esta informacin no tiene como fin reemplazar el consejo del mdico. Asegrese de hacerle al mdico cualquier pregunta que tenga. Document Revised: 05/04/2022 Document Reviewed: 05/04/2022 Elsevier Patient Education  2024 Elsevier Inc. Recuento de carbohidratos para la diabetes mellitus en los adultos Carbohydrate Counting for Diabetes Mellitus, Adult El recuento de carbohidratos es un mtodo para llevar un registro de la cantidad de carbohidratos que se ingieren. La ingesta de carbohidratos aumenta la cantidad de azcar (glucosa) en la sangre. El recuento de la cantidad de carbohidratos que ingiere mejora el control de su glucemia. Esto, a su vez, le ayuda a controlar su diabetes. Los carbohidratos se miden en gramos (g) por porcin. Es importante saber la cantidad de carbohidratos (en gramos o por tamao de  porcin) que se puede ingerir en cada comida. Esto es diferente en cada persona. Un nutricionista puede ayudarlo a crear un plan de alimentacin y a calcular la cantidad de carbohidratos que debe ingerir en cada comida y colacin. Qu alimentos contienen carbohidratos? Los siguientes alimentos incluyen carbohidratos: Granos, como panes y cereales. Frijoles secos y productos con soja. Verduras con almidn, como papas, guisantes y maz. Frutas y jugos de frutas. Leche y yogur. Dulces y colaciones, como pasteles, galletas, caramelos, papas fritas de bolsa y refrescos. Cmo se calculan los carbohidratos de los alimentos? Hay dos maneras de calcular los carbohidratos de los alimentos. Puede leer las etiquetas de los alimentos o aprender cules son los tamaos de las porciones estndar de los alimentos. Puede usar cualquiera de estos mtodos o una combinacin de ambos. Usar la etiqueta de informacin nutricional La lista de Informacin nutricional est incluida en las etiquetas de casi todas las bebidas y los alimentos envasados de los [place]. Esto incluye lo siguiente: El tamao de la porcin. Informacin sobre los nutrientes de cada porcin, incluidos los gramos de carbohidratos por porcin. Para usar la Informacin nutricional, decida cuntas porciones tomar. Luego, multiplique el nmero de porciones por la   cantidad de carbohidratos por porcin. El nmero resultante es la cantidad total de carbohidratos que comer. Conocer los tamaos de las porciones estndar de los alimentos Cuando coma alimentos que contengan carbohidratos y que no estn envasados o no incluyan la informacin nutricional en la etiqueta, debe medir las porciones para poder calcular los gramos de carbohidratos. Mida los alimentos que comer con una balanza de alimentos o una taza medidora, si es necesario. Decida cuntas porciones de tamao estndar comer. Multiplique el nmero de porciones por 15. En los alimentos que  contienen carbohidratos, una porcin equivale a 15 g de carbohidratos. Por ejemplo, si come 2 tazas o 10 onzas (300 g) de fresas, habr comido 2 porciones y 30 g de carbohidratos (2 porciones x 15 g = 30 g). En el caso de las comidas que contienen mezclas de ms de un alimento, como las sopas y los guisos, debe calcular los carbohidratos de cada alimento que se incluye. La siguiente lista contiene los tamaos de porciones estndar de los alimentos ricos en carbohidratos ms comunes. Cada una de estas porciones tiene aproximadamente 15 g de carbohidratos: 1 rebanada de pan. 1 tortilla de seis pulgadas (15 cm). ? de taza o 2 onzas (53 g) de arroz o pasta cocidos.  taza o 3 onzas (85 g) de lentejas o frijoles cocidos o enlatados, escurridos y enjuagados.  taza o 3 onzas (85 g) de verduras con almidn, como guisantes, maz o zapallo.  taza o 4 onzas (120 g) de cereal caliente.  taza o 3 onzas (85 g) de papas hervidas o en pur, o  o 3 onzas (85 g) de una papa grande al horno.  taza o 4 onzas fluidas (118 ml) de jugo de frutas. 1 taza u 8 onzas fluidas (237 ml) de leche. 1 unidad pequea o 4 onzas (106 g) de manzana.  unidad o 2 onzas (63 g) de una banana mediana. 1 taza o 5 oz (150 g) de fresas. 3 tazas o 1 oz (28.3 g) de palomitas de maz. Cul sera un ejemplo de recuento de carbohidratos? Para calcular los gramos de carbohidratos de este ejemplo de comida, siga los pasos que se describen a continuacin. Ejemplo de comida 3 onzas (85 g) de pechugas de pollo. ? de taza o 4 onzas (106 g) de arroz integral.  taza o 3 onzas (85 g) de maz. 1 taza u 8 onzas fluidas (237 ml) de leche. 1 taza o 5 onzas (150 g) de fresas con crema batida sin azcar. Clculo de carbohidratos Identifique los alimentos que contienen carbohidratos: Arroz. Maz. Leche. Fresas. Calcule cuntas porciones come de cada alimento: 2 porciones de arroz. 1 porcin de maz. 1 porcin de leche. 1 porcin de  fresas. Multiplique cada nmero de porciones por 15 g: 2 porciones de arroz x 15 g = 30 g. 1 porcin de maz x 15 g = 15 g. 1 porcin de leche x 15 g = 15 g. 1 porcin de fresas x 15 g = 15 g. Sume todas las cantidades para conocer el total de gramos de carbohidratos consumidos: 30 g + 15 g + 15 g + 15 g = 75 g de carbohidratos en total. Consejos para seguir este plan Al ir de compras [place] un plan de comidas y luego haga una lista de compras. Compre verduras frescas y congeladas, frutas frescas y congeladas, productos lcteos, huevos, frijoles, lentejas y cereales integrales. Fjese en las etiquetas de los alimentos. Elija los alimentos que tengan ms fibra y menos azcar.   Evite los alimentos procesados y los alimentos con azcares agregados. Planificacin de las comidas Trate de consumir la misma cantidad de gramos de carbohidratos en cada comida y en cada colacin. Planifique tomar comidas y colaciones regulares y equilibradas. Dnde buscar ms informacin American Diabetes Association (Asociacin Estadounidense de la Diabetes): diabetes.org Centers for Disease Control and Prevention (Centros para el Control y la Prevencin de Enfermedades): cdc.gov Academy of Nutrition and Dietetics (Academia de Nutricin y Diettica): eatright.org Association of Diabetes Care & Education Specialists (Asociacin de Especialistas en Atencin y Educacin sobre la Diabetes): diabeteseducator.org Resumen El recuento de carbohidratos es un mtodo para llevar un registro de la cantidad de carbohidratos que se ingieren. La ingesta de carbohidratos aumenta la cantidad de azcar (glucosa) en la sangre. El recuento de la cantidad de carbohidratos que ingiere mejora el control de su glucemia. Esto le ayuda a controlar su diabetes. Un nutricionista puede ayudarlo a crear un plan de alimentacin y a calcular la cantidad de carbohidratos que debe ingerir en cada comida y colacin. Esta informacin no tiene como  fin reemplazar el consejo del mdico. Asegrese de hacerle al mdico cualquier pregunta que tenga. Document Revised: 12/15/2019 Document Reviewed: 12/15/2019 Elsevier Patient Education  2024 Elsevier Inc.  

## 2022-10-04 NOTE — Progress Notes (Signed)
Established Patient Office Visit  Subjective   Patient ID: Hector Weber, male    DOB: [birth year]  Age: 52 y.o. MRN: [id number]  No chief complaint on file.   HPI  Hector Weber is a 52 y/o male who has a history of T2DM, hypertension, and hyperlipidemia presents for a follow up visit. His HgbA1c checked during visit  increased from 7.9% to 8%, and his blood glucose was 120 mg/dl.  He reports not checking his blood glucose daily, and also misses taking his medications some days. He denies hypo/hyperglycemic symptoms, peripheral neuropathy and performs daily foot checks. Overall, he states that he's doing well and offers no further complaint.   Review of Systems  Constitutional: Negative.   Eyes: Negative.   Respiratory: Negative.    Cardiovascular: Negative.   Genitourinary: Negative.   Skin: Negative.   Neurological: Negative.   Endo/Heme/Allergies: Negative.   Psychiatric/Behavioral: Negative.        Objective:     BP (!) 148/79 (BP Location: Left Arm, Patient Position: Sitting)   Pulse 82   Temp 98.5 F (36.9 C) (Oral)   Wt 160 lb 3.2 oz (72.7 kg)   SpO2 98%   BMI 26.66 kg/m  BP Readings from Last 3 Encounters:  10/04/22 (!) 148/79  07/03/22 129/81  05/30/22 124/78   Wt Readings from Last 3 Encounters:  10/04/22 160 lb 3.2 oz (72.7 kg)  07/03/22 157 lb 3.2 oz (71.3 kg)  05/30/22 158 lb 1.6 oz (71.7 kg)      Physical Exam HENT:     Head: Normocephalic and atraumatic.     Mouth/Throat:     Mouth: Mucous membranes are moist.  Eyes:     Extraocular Movements: Extraocular movements intact.     Conjunctiva/sclera: Conjunctivae normal.     Pupils: Pupils are equal, round, and reactive to light.  Cardiovascular:     Rate and Rhythm: Normal rate and regular rhythm.     Pulses: Normal pulses.     Heart sounds: Normal heart sounds.  Pulmonary:     Effort: Pulmonary effort is normal.     Breath sounds: Normal breath sounds.  Skin:    General:  Skin is warm.  Neurological:     General: No focal deficit present.     Mental Status: [name] alert and oriented to person, place, and time. Mental status is at baseline.  Psychiatric:        Mood and Affect: Mood normal.        Behavior: Behavior normal.        Thought Content: Thought content normal.        Judgment: Judgment normal.      No results found for any visits on 10/04/22.  Last CBC Lab Results  Component Value Date   WBC 5.1 09/07/2021   HGB 16.2 09/07/2021   HCT 47.8 09/07/2021   MCV 92 09/07/2021   MCH 31.1 09/07/2021   RDW 12.4 09/07/2021   PLT 220 09/07/2021   Last metabolic panel Lab Results  Component Value Date   GLUCOSE 238 (H) 09/07/2021   NA 139 09/07/2021   K 4.1 09/07/2021   CL 100 09/07/2021   CO2 24 09/07/2021   BUN 17 09/07/2021   CREATININE 0.62 (L) 09/07/2021   EGFR 117 09/07/2021   CALCIUM 9.5 09/07/2021   PROT 7.4 09/07/2021   ALBUMIN 4.8 09/07/2021   LABGLOB 2.6 09/07/2021   AGRATIO 1.8 09/07/2021   BILITOT 0.4 09/07/2021  ALKPHOS 112 09/07/2021   AST 15 09/07/2021   ALT 30 09/07/2021   Last lipids Lab Results  Component Value Date   CHOL 115 07/03/2022   HDL 48 07/03/2022   LDLCALC 57 07/03/2022   TRIG 37 07/03/2022   CHOLHDL 2.4 07/03/2022   Last hemoglobin A1c Lab Results  Component Value Date   HGBA1C 7.9 (A) 07/03/2022   Last thyroid functions Lab Results  Component Value Date   TSH 1.200 05/20/2018      The ASCVD Risk score (Arnett DK, et al., 2019) failed to calculate for the following reasons:   The valid total cholesterol range is 130 to 320 mg/dL    Assessment & Plan:   1. Health care maintenance - Routine labs will be checked - CBC w/Diff; Future - Lipid panel; Future - Comp Met (CMET); Future - Urine Microalbumin w/creat. ratio; Future - Comp Met (CMET) - CBC w/Diff - Lipid panel - Urine Microalbumin w/creat. ratio  2. Type 2 diabetes mellitus without complication, without long-term  current use of insulin (HCC) - His HgbA1c was 8%, he was strongly encouraged to adhere to his treatment regimen. He will continue on current medication, low carb/non concentrated sweet diet and exercise as tolerated. - POCT HgB A1C; Future - POCT Glucose (CBG); Future - POCT Glucose (CBG) - POCT HgB A1C - glipiZIDE (GLUCOTROL XL) 10 MG 24 hr tablet; Take 1 tablet (10 mg total) by mouth daily with breakfast.  Dispense: 30 tablet; Refill: 3 - metFORMIN (GLUCOPHAGE) 1000 MG tablet; TAKE 1 TABLET BY MOUTH TWICE A DAY WITH A MEAL  Dispense: 60 tablet; Refill: 3  3. Hyperlipidemia LDL goal <70 - He will continue current medication, low fat/cholesterol diet and exercise as tolerated. - atorvastatin (LIPITOR) 40 MG tablet; Take 1 tablet (40 mg total) by mouth daily.  Dispense: 30 tablet; Refill: 3  4. Essential hypertension - His blood pressure is not under control, will continue current medication DASH diet and exercise as tolerated. - carvedilol (COREG) 12.5 MG tablet; Take 12.5 mg twice daily  Dispense: 60 tablet; Refill: 3    Return in about 13 weeks (around 01/03/2023), or if symptoms worsen or fail to improve.    Numan Zylstra Trellis Paganini, NP

## 2022-10-05 LAB — CBC WITH DIFFERENTIAL/PLATELET
Basophils Absolute: 0 10*3/uL (ref 0.0–0.2)
Basos: 1 %
EOS (ABSOLUTE): 0.1 10*3/uL (ref 0.0–0.4)
Eos: 2 %
Hematocrit: 45.3 % (ref 37.5–51.0)
Hemoglobin: 15.9 g/dL (ref 13.0–17.7)
Immature Grans (Abs): 0 10*3/uL (ref 0.0–0.1)
Immature Granulocytes: 0 %
Lymphocytes Absolute: 2 10*3/uL (ref 0.7–3.1)
Lymphs: 36 %
MCH: 31.2 pg (ref 26.6–33.0)
MCHC: 35.1 g/dL (ref 31.5–35.7)
MCV: 89 fL (ref 79–97)
Monocytes Absolute: 0.6 10*3/uL (ref 0.1–0.9)
Monocytes: 10 %
Neutrophils Absolute: 3 10*3/uL (ref 1.4–7.0)
Neutrophils: 51 %
Platelets: 246 10*3/uL (ref 150–450)
RBC: 5.1 x10E6/uL (ref 4.14–5.80)
RDW: 12.5 % (ref 11.6–15.4)
WBC: 5.7 10*3/uL (ref 3.4–10.8)

## 2022-10-05 LAB — COMPREHENSIVE METABOLIC PANEL
ALT: 30 IU/L (ref 0–44)
AST: 20 IU/L (ref 0–40)
Albumin: 4.7 g/dL (ref 4.1–5.1)
Alkaline Phosphatase: 90 IU/L (ref 44–121)
BUN/Creatinine Ratio: 27 — ABNORMAL HIGH (ref 9–20)
BUN: 22 mg/dL (ref 6–24)
Bilirubin Total: 0.8 mg/dL (ref 0.0–1.2)
CO2: 24 mmol/L (ref 20–29)
Calcium: 9.4 mg/dL (ref 8.7–10.2)
Chloride: 100 mmol/L (ref 96–106)
Creatinine, Ser: 0.82 mg/dL (ref 0.76–1.27)
Globulin, Total: 2.7 g/dL (ref 1.5–4.5)
Glucose: 140 mg/dL — ABNORMAL HIGH (ref 70–99)
Potassium: 4.6 mmol/L (ref 3.5–5.2)
Sodium: 138 mmol/L (ref 134–144)
Total Protein: 7.4 g/dL (ref 6.0–8.5)
eGFR: 107 mL/min/{1.73_m2} (ref >59)

## 2022-10-05 LAB — LIPID PANEL
Chol/HDL Ratio: 3.1 ratio (ref 0.0–5.0)
Cholesterol, Total: 167 mg/dL (ref 100–199)
HDL: 54 mg/dL (ref >39)
LDL Chol Calc (NIH): 98 mg/dL (ref 0–99)
Triglycerides: 77 mg/dL (ref 0–149)
VLDL Cholesterol Cal: 15 mg/dL (ref 5–40)

## 2022-10-05 LAB — MICROALBUMIN / CREATININE URINE RATIO
Creatinine, Urine: 19.6 mg/dL
Microalb/Creat Ratio: <15 mg/g creat (ref 0–29)
Microalbumin, Urine: <3 ug/mL

## 2022-12-24 ENCOUNTER — Other Ambulatory Visit: Payer: Self-pay

## 2023-01-03 ENCOUNTER — Ambulatory Visit: Payer: Self-pay | Admitting: Gerontology

## 2023-01-03 VITALS — BP 129/82 | HR 86 | Temp 98.3°F | Resp 16 | Ht 65.0 in | Wt 163.2 lb

## 2023-01-03 DIAGNOSIS — E119 Type 2 diabetes mellitus without complications: Secondary | ICD-10-CM

## 2023-01-03 LAB — GLUCOSE, POCT (MANUAL RESULT ENTRY): POC Glucose: 100 mg/dl — AB (ref 70–99)

## 2023-01-03 LAB — POCT GLYCOSYLATED HEMOGLOBIN (HGB A1C): Hemoglobin A1C: 9.3 % — AB (ref 4.0–5.6)

## 2023-01-03 MED ORDER — GLIPIZIDE ER 10 MG PO TB24
10.0000 mg | ORAL_TABLET | Freq: Every day | ORAL | 3 refills | Status: DC
Start: 2023-01-03 — End: 2023-04-18

## 2023-01-03 MED ORDER — METFORMIN HCL 1000 MG PO TABS: ORAL_TABLET | ORAL | 3 refills | Status: DC

## 2023-01-03 NOTE — Patient Instructions (Signed)
Recuento de carbohidratos para la diabetes mellitus en los adultos Carbohydrate Counting for Diabetes Mellitus, Adult El recuento de carbohidratos es un mtodo para llevar un registro de la cantidad de carbohidratos que se ingieren. La ingesta de carbohidratos aumenta la cantidad de azcar (glucosa) en la sangre. El recuento de la cantidad de carbohidratos que ingiere mejora el control de su glucemia. Esto, a su vez, le ayuda a controlar su diabetes. Los carbohidratos se miden en gramos (g) por porcin. Es importante saber la cantidad de carbohidratos (en gramos o por tamao de porcin) que se puede ingerir en cada comida. Esto es Government social research officer. Un nutricionista puede ayudarlo a crear un plan de alimentacin y a calcular la cantidad de carbohidratos que debe ingerir en cada comida y colacin. Qu alimentos contienen carbohidratos? Los siguientes alimentos incluyen carbohidratos: Granos, como panes y cereales. Frijoles secos y productos con soja. Verduras con almidn, como papas, guisantes y maz. Nils Pyle y jugos de frutas. Leche y Dentist. Dulces y colaciones, como pasteles, galletas, caramelos, papas fritas de bolsa y refrescos. Cmo se calculan los carbohidratos de los alimentos? Hay dos maneras de calcular los carbohidratos de los alimentos. Puede leer las etiquetas de los alimentos o aprender cules son los tamaos de las porciones estndar de los alimentos. Puede usar cualquiera de estos mtodos o una combinacin de [employer]. Usar la Air cabin crew de informacin nutricional La lista de Informacin nutricional est incluida en las etiquetas de casi todas las bebidas y los alimentos envasados de los [place]. Esto incluye lo siguiente: El tamao de la porcin. Informacin sobre los nutrientes de cada porcin, incluidos los gramos de carbohidratos por porcin. Para usar la Informacin nutricional, decida cuntas porciones tomar. Luego, multiplique el nmero de porciones por la cantidad  de carbohidratos por porcin. El nmero resultante es la cantidad total de carbohidratos que comer. Conocer los tamaos de las porciones estndar de los alimentos Cuando coma alimentos que contengan carbohidratos y que no estn envasados o no incluyan la informacin nutricional en la etiqueta, debe medir las porciones para poder calcular los gramos de carbohidratos. Mida los alimentos que comer con una balanza de alimentos o una taza medidora, si es necesario. Decida cuntas porciones de Programmer, systems. Multiplique el nmero de porciones por 15. En los alimentos que contienen carbohidratos, una porcin [place] a 15 g de carbohidratos. Por ejemplo, si come 2 tazas o 10 onzas (300 g) de fresas, habr comido 2 porciones y 30 g de carbohidratos (2 porciones x 15 g = 30 g). En el caso de las comidas que contienen mezclas de ms de un alimento, como las sopas y los guisos, debe calcular los carbohidratos de cada alimento que se incluye. La siguiente lista contiene los tamaos de porciones estndar de los alimentos ricos en carbohidratos ms comunes. Cada una de estas porciones tiene aproximadamente 15 g de carbohidratos: 1 rebanada de pan. 1 tortilla de seis pulgadas (15 cm). ? de taza o 2 onzas (53 g) de arroz o pasta cocidos.  taza o 3 onzas (85 g) de lentejas o frijoles cocidos o enlatados, escurridos y enjuagados.  taza o 3 onzas (85 g) de verduras con almidn, como guisantes, maz o zapallo.  taza o 4 onzas (120 g) de cereal caliente.  taza o 3 onzas (85 g) de papas hervidas o en pur, o  o 3 onzas (85 g) de una papa grande al horno.  taza o 4 onzas fluidas (118 ml) de jugo de frutas. 1 taza u  8 onzas fluidas (237 ml) de leche. 1 unidad pequea o 4 onzas (106 g) de manzana.  unidad o 2 onzas (63 g) de una banana mediana. 1 taza o 5 oz (150 g) de fresas. 3 tazas o 1 oz (28.3 g) de palomitas de maz. Cul sera un ejemplo de recuento de carbohidratos? Para calcular los gramos  de carbohidratos de este ejemplo de comida, siga los pasos que se describen a continuacin. Ejemplo de comida 3 onzas (85 g) de pechugas de pollo. ? de taza o 4 onzas (106 g) de arroz integral.  taza o 3 onzas (85 g) de maz. 1 taza u 8 onzas fluidas (237 ml) de leche. 1 taza o 5 onzas (150 g) de fresas con crema batida sin azcar. Clculo de carbohidratos Identifique los alimentos que contienen carbohidratos: Arroz. Maz. Leche. Jinny Sanders. Calcule cuntas porciones come de cada alimento: 2 porciones de arroz. 1 porcin de maz. 1 porcin de leche. 1 porcin de fresas. Multiplique cada nmero de porciones por 15 g: 2 porciones de arroz x 15 g = 30 g. 1 porcin de maz x 15 g = 15 g. 1 porcin de leche x 15 g = 15 g. 1 porcin de fresas x 15 g = 15 g. Sume todas las cantidades para conocer el total de gramos de carbohidratos consumidos: 30 g + 15 g + 15 g + 15 g = 75 g de carbohidratos en total. Consejos para seguir este plan Al ir de compras [place] un plan de comidas y luego haga una lista de compras. Compre verduras frescas y congeladas, frutas frescas y congeladas, productos lcteos, huevos, frijoles, lentejas y cereales integrales. Fjese en las etiquetas de los alimentos. Elija los alimentos que tengan ms fibra y Neurosurgeon. Evite los alimentos procesados y los alimentos con Biochemist, clinical. Planificacin de las comidas Trate de consumir la misma cantidad de gramos de carbohidratos en cada comida y en cada colacin. Planifique tomar comidas y colaciones regulares y equilibradas. Dnde buscar ms informacin American Diabetes Association (Asociacin Estadounidense de la Diabetes): diabetes.org Centers for Disease Control and Prevention (Centros para el Control y la Prevencin de [place]): TonerPromos.no Academy of Nutrition and Dietetics (Academia de Nutricin y Pension scheme manager): Theme [email or web address] Association of Diabetes Care & Education Specialists (Asociacin de Especialistas en  Atencin y Educacin sobre la Diabetes): diabeteseducator.org Resumen El recuento de carbohidratos es un mtodo para llevar un registro de la cantidad de carbohidratos que se ingieren. La ingesta de carbohidratos aumenta la cantidad de azcar (glucosa) en la sangre. El recuento de la cantidad de carbohidratos que ingiere mejora el control de su glucemia. Esto le ayuda a Chief Operating Officer su diabetes. Un nutricionista puede ayudarlo a crear un plan de alimentacin y a calcular la cantidad de carbohidratos que debe ingerir en cada comida y colacin. Esta informacin no tiene Theme park manager el consejo del mdico. Asegrese de hacerle al mdico cualquier pregunta que tenga. Document Revised: 12/15/2019 Document Reviewed: 12/15/2019 Elsevier Patient Education  2024 [employer].

## 2023-01-03 NOTE — Progress Notes (Signed)
Established Patient Office Visit  Subjective   Patient ID: Hector Weber, male    DOB: 1971-01-02  Age: 52 y.o. MRN: [id number]  Chief Complaint  Patient presents with   Follow-up    HPI Hector Weber Hector Weber is a 52 y/o male who has a history of T2DM, hypertension, and hyperlipidemia presents for a follow up visit. His HgbA1c checked during visit  increased from 8% to 9.3%, and his blood glucose was 100 mg/dl.  He states that his diet consist of meat, bread, fried chicken, fruits and vegetables. He reports checking his blood glucose daily but forgot to bring his blood sugar log. He denies hypo/hyperglycemic symptoms, peripheral neuropathy and performs daily foot checks. Overall, he states that he's doing well and offers no further complaint.     Patient Active Problem List   Diagnosis Date Noted   Otitis media 10/19/2021   Tooth ache 10/11/2021   Tinnitus of left ear 03/26/2018   Cerumen impaction 03/26/2018   Routine health maintenance 07/17/2016   Diabetes mellitus without complication (HCC) 02/03/2015   Essential hypertension 02/03/2015   Hyperlipidemia LDL goal <70 02/03/2015   Past Medical History:  Diagnosis Date   Anemia    Arthritis    Chronic kidney disease    Diabetes (HCC)    Hyperlipidemia    Hypertension    Medication monitoring encounter 07/17/2016   Sleep apnea    Past Surgical History:  Procedure Laterality Date   NO PAST SURGERIES     Social History   Tobacco Use   Smoking status: Some Days    Types: Cigarettes   Smokeless tobacco: Former    Types: Snuff, Chew    Quit date: 09/08/2000   Tobacco comments:    Occasional cigarette use 4-5 cig per week  Vaping Use   Vaping status: Never Used  Substance Use Topics   Alcohol use: Yes    Alcohol/week: 1.0 standard drink of alcohol    Types: 1 Cans of beer per week    Comment: an occassional beer on the weekend   Drug use: Never   Social History   Socioeconomic History   Marital status:  Single    Spouse name: Not on file   Number of children: 3   Years of education: Not on file   Highest education level: Not on file  Occupational History    Comment: restaurant  Tobacco Use   Smoking status: Some Days    Types: Cigarettes   Smokeless tobacco: Former    Types: Snuff, Chew    Quit date: 09/08/2000   Tobacco comments:    Occasional cigarette use 4-5 cig per week  Vaping Use   Vaping status: Never Used  Substance and Sexual Activity   Alcohol use: Yes    Alcohol/week: 1.0 standard drink of alcohol    Types: 1 Cans of beer per week    Comment: an occassional beer on the weekend   Drug use: Never   Sexual activity: Yes    Birth control/protection: Coitus interruptus  Other Topics Concern   Not on file  Social History Narrative   Pays lots in child support, which takes a lot of his money. Unsure if he has applied for food stamps before.   Social Determinants of Health   Financial Resource Strain: Medium Risk (03/22/2022)   Overall Financial Resource Strain (CARDIA)    Difficulty of Paying Living Expenses: Somewhat hard  Food Insecurity: Food Insecurity Present (03/22/2022)   Hunger  Vital Sign    Worried About Programme researcher, broadcasting/film/video in the Last Year: Sometimes true    Ran Out of Food in the Last Year: Sometimes true  Transportation Needs: No Transportation Needs (03/22/2022)   PRAPARE - Administrator, Civil Service (Medical): No    Lack of Transportation (Non-Medical): No  Physical Activity: Sufficiently Active (03/22/2022)   Exercise Vital Sign    Days of Exercise per Week: 5 days    Minutes of Exercise per Session: 30 min  Stress: No Stress Concern Present (03/22/2022)   [employer] of Occupational Health - Occupational Stress Questionnaire    Feeling of Stress : Not at all  Social Connections: Unknown (03/22/2022)   Social Connection and Isolation Panel [NHANES]    Frequency of Communication with Friends and Family: Three times a week     Frequency of Social Gatherings with Friends and Family: Never    Attends Religious Services: Not on file    Active Member of Clubs or Organizations: No    Attends Banker Meetings: Never    Marital Status: Separated  Intimate Partner Violence: Not At Risk (03/22/2022)   Humiliation, Afraid, Rape, and Kick questionnaire    Fear of Current or Ex-Partner: No    Emotionally Abused: No    Physically Abused: No    Sexually Abused: No   Family Status  Relation Name Status   Mother  Alive   Father  Alive   Sister  Alive  No partnership data on file   Family History  Problem Relation Age of Onset   Diabetes type II Mother    Seizures Father    Diabetes Sister    No Known Allergies    Review of Systems  Constitutional: Negative.   HENT: Negative.    Eyes: Negative.   Respiratory: Negative.    Cardiovascular: Negative.   Gastrointestinal: Negative.   Genitourinary: Negative.   Musculoskeletal: Negative.   Skin: Negative.   Neurological: Negative.   Endo/Heme/Allergies: Negative.   Psychiatric/Behavioral: Negative.        Objective:     BP 129/82 (BP Location: Left Arm, Patient Position: Sitting, Cuff Size: Normal)   Pulse 86   Temp 98.3 F (36.8 C)   Resp 16   Ht 5\' 5"  (1.651 m)   Wt 163 lb 3.2 oz (74 kg)   SpO2 98%   BMI 27.16 kg/m  BP Readings from Last 3 Encounters:  01/03/23 129/82  10/04/22 (!) 148/79  07/03/22 129/81   Wt Readings from Last 3 Encounters:  01/03/23 163 lb 3.2 oz (74 kg)  10/04/22 160 lb 3.2 oz (72.7 kg)  07/03/22 157 lb 3.2 oz (71.3 kg)      Physical Exam Constitutional:      Appearance: Normal appearance.  HENT:     Head: Normocephalic.     Nose: Nose normal.  Eyes:     Pupils: Pupils are equal, round, and reactive to light.  Cardiovascular:     Rate and Rhythm: Normal rate and regular rhythm.     Pulses: Normal pulses.     Heart sounds: Normal heart sounds.  Pulmonary:     Effort: Pulmonary effort is normal.      Breath sounds: Normal breath sounds.  Abdominal:     General: Abdomen is flat.  Musculoskeletal:        General: Normal range of motion.     Cervical back: Normal range of motion.  Skin:    General:  Skin is warm.  Neurological:     General: No focal deficit present.     Mental Status: [name] alert and oriented to person, place, and time.  Psychiatric:        Mood and Affect: Mood normal.        Behavior: Behavior normal.        Thought Content: Thought content normal.        Judgment: Judgment normal.      Results for orders placed or performed in visit on 01/03/23  POCT HgB A1C  Result Value Ref Range   Hemoglobin A1C 9.3 (A) 4.0 - 5.6 %   HbA1c POC (<> result, manual entry)     HbA1c, POC (prediabetic range)     HbA1c, POC (controlled diabetic range)      Last CBC Lab Results  Component Value Date   WBC 5.7 10/04/2022   HGB 15.9 10/04/2022   HCT 45.3 10/04/2022   MCV 89 10/04/2022   MCH 31.2 10/04/2022   RDW 12.5 10/04/2022   PLT 246 10/04/2022   Last metabolic panel Lab Results  Component Value Date   GLUCOSE 140 (H) 10/04/2022   NA 138 10/04/2022   K 4.6 10/04/2022   CL 100 10/04/2022   CO2 24 10/04/2022   BUN 22 10/04/2022   CREATININE 0.82 10/04/2022   EGFR 107 10/04/2022   CALCIUM 9.4 10/04/2022   PROT 7.4 10/04/2022   ALBUMIN 4.7 10/04/2022   LABGLOB 2.7 10/04/2022   AGRATIO 1.8 09/07/2021   BILITOT 0.8 10/04/2022   ALKPHOS 90 10/04/2022   AST 20 10/04/2022   ALT 30 10/04/2022   Last lipids Lab Results  Component Value Date   CHOL 167 10/04/2022   HDL 54 10/04/2022   LDLCALC 98 10/04/2022   TRIG 77 10/04/2022   CHOLHDL 3.1 10/04/2022   Last hemoglobin A1c Lab Results  Component Value Date   HGBA1C 9.3 (A) 01/03/2023   Last thyroid functions Lab Results  Component Value Date   TSH 1.200 05/20/2018   Last vitamin D No results found for: "25OHVITD2", "25OHVITD3", "VD25OH" Last vitamin B12 and Folate No results found for:  "VITAMINB12", "FOLATE"    The 10-year ASCVD risk score (Arnett DK, et al., 2019) is: 12.2%    Assessment & Plan:  1. Type 2 diabetes mellitus without complication, without long-term current use of insulin (HCC) His HgbA1c was 9.3%, his goal should be less than 7%,he was encouraged to adhere to his treatment regimen. He will continue on current medication, low carb/non concentrated sweet diet and exercise as tolerated.  He was encouraged to check his blood sugar everyday and write down the values on a log and bring it to his next visit.  - POCT HgB A1C - glipiZIDE (GLUCOTROL XL) 10 MG 24 hr tablet; Take 1 tablet (10 mg total) by mouth daily with breakfast.  Dispense: 30 tablet; Refill: 3 - metFORMIN (GLUCOPHAGE) 1000 MG tablet; TAKE 1 TABLET BY MOUTH TWICE A DAY WITH A MEAL  Dispense: 60 tablet; Refill: 3 - POCT Glucose (CBG); Future  -   Return in about 4 weeks (around 01/31/2023).    Chioma Trellis Paganini, NP

## 2023-01-31 ENCOUNTER — Other Ambulatory Visit: Payer: Self-pay

## 2023-01-31 ENCOUNTER — Ambulatory Visit: Payer: Self-pay | Admitting: Gerontology

## 2023-01-31 VITALS — BP 131/77 | HR 105 | Temp 98.8°F | Ht 65.0 in | Wt 162.6 lb

## 2023-01-31 DIAGNOSIS — R6889 Other general symptoms and signs: Secondary | ICD-10-CM | POA: Insufficient documentation

## 2023-01-31 DIAGNOSIS — E119 Type 2 diabetes mellitus without complications: Secondary | ICD-10-CM

## 2023-01-31 NOTE — Progress Notes (Signed)
Established Patient Office Visit  Subjective   Patient ID: Hector Weber, male    DOB: 1971/04/03  Age: 52 y.o. MRN: [id number]  Chief Complaint  Patient presents with   Diabetes    Here for follow up for diabetes management. Patient states he is also feeling sick today and his wife and kids have been sick.    HPI  Hector Weber is a 52 y/o male who has a history of T2DM, hypertension, and hyperlipidemia presents for a follow up visit. His HgbA1c checked on 01/03/23 was 9.3%, he states that he's compliant with his medications, denies side effects and continues to make healthy lifestyle changes. He reports checking his blood glucose daily and per his log, his  fasting readings ranges between 130 mg/dl to 952 mg/dl. He denies hypo/hyperglycemic symptoms, peripheral neuropathy and performs daily foot checks. He reports upper respiratory symptoms, mild intermittent rhinorrhea due exposed to sick contacts  which has been going on for 1 week. He reports taking otc medications and states that his symptoms has improved 60%. Overall, he states that he's doing well and offers no further complaint.    Patient Active Problem List   Diagnosis Date Noted   Flu-like symptoms 01/31/2023   Otitis media 10/19/2021   Tooth ache 10/11/2021   Tinnitus of left ear 03/26/2018   Cerumen impaction 03/26/2018   Routine health maintenance 07/17/2016   Diabetes mellitus without complication (HCC) 02/03/2015   Essential hypertension 02/03/2015   Hyperlipidemia LDL goal <70 02/03/2015   Past Medical History:  Diagnosis Date   Anemia    Arthritis    Chronic kidney disease    Diabetes (HCC)    Hyperlipidemia    Hypertension    Medication monitoring encounter 07/17/2016   Sleep apnea    Past Surgical History:  Procedure Laterality Date   NO PAST SURGERIES     Social History   Tobacco Use   Smoking status: Some Days    Types: Cigarettes   Smokeless tobacco: Former    Types: Snuff, Chew     Quit date: 09/08/2000   Tobacco comments:    Occasional cigarette use 4-5 cig per week  Vaping Use   Vaping status: Never Used  Substance Use Topics   Alcohol use: Yes    Alcohol/week: 1.0 standard drink of alcohol    Types: 1 Cans of beer per week    Comment: an occassional beer on the weekend   Drug use: Never   No Known Allergies    Review of Systems  Constitutional: Negative.  Negative for chills and fever.  HENT:  Negative for congestion, sinus pain and sore throat.   Eyes: Negative.   Respiratory: Negative.    Cardiovascular: Negative.   Neurological: Negative.   Endo/Heme/Allergies: Negative.   Psychiatric/Behavioral: Negative.        Objective:     BP 131/77 (BP Location: Left Arm, Patient Position: Sitting, Cuff Size: Large)   Pulse (!) 105   Temp 98.8 F (37.1 C)   Ht 5\' 5"  (1.651 m)   Wt 162 lb 9.6 oz (73.8 kg)   SpO2 98%   BMI 27.06 kg/m  BP Readings from Last 3 Encounters:  01/31/23 131/77  01/03/23 129/82  10/04/22 (!) 148/79   Wt Readings from Last 3 Encounters:  01/31/23 162 lb 9.6 oz (73.8 kg)  01/03/23 163 lb 3.2 oz (74 kg)  10/04/22 160 lb 3.2 oz (72.7 kg)      Physical Exam  HENT:     Head: Normocephalic and atraumatic.     Nose: Nose normal. No congestion or rhinorrhea.     Mouth/Throat:     Mouth: Mucous membranes are moist.  Eyes:     Pupils: Pupils are equal, round, and reactive to light.  Cardiovascular:     Rate and Rhythm: Normal rate and regular rhythm.     Pulses: Normal pulses.     Heart sounds: Normal heart sounds.  Pulmonary:     Effort: Pulmonary effort is normal.     Breath sounds: Normal breath sounds.  Skin:    General: Skin is warm.  Neurological:     General: No focal deficit present.     Mental Status: [name] alert and oriented to person, place, and time. Mental status is at baseline.  Psychiatric:        Mood and Affect: Mood normal.        Behavior: Behavior normal.        Thought Content: Thought  content normal.        Judgment: Judgment normal.      No results found for any visits on 01/31/23.  Last CBC Lab Results  Component Value Date   WBC 5.7 10/04/2022   HGB 15.9 10/04/2022   HCT 45.3 10/04/2022   MCV 89 10/04/2022   MCH 31.2 10/04/2022   RDW 12.5 10/04/2022   PLT 246 10/04/2022   Last metabolic panel Lab Results  Component Value Date   GLUCOSE 140 (H) 10/04/2022   NA 138 10/04/2022   K 4.6 10/04/2022   CL 100 10/04/2022   CO2 24 10/04/2022   BUN 22 10/04/2022   CREATININE 0.82 10/04/2022   EGFR 107 10/04/2022   CALCIUM 9.4 10/04/2022   PROT 7.4 10/04/2022   ALBUMIN 4.7 10/04/2022   LABGLOB 2.7 10/04/2022   AGRATIO 1.8 09/07/2021   BILITOT 0.8 10/04/2022   ALKPHOS 90 10/04/2022   AST 20 10/04/2022   ALT 30 10/04/2022   Last lipids Lab Results  Component Value Date   CHOL 167 10/04/2022   HDL 54 10/04/2022   LDLCALC 98 10/04/2022   TRIG 77 10/04/2022   CHOLHDL 3.1 10/04/2022   Last hemoglobin A1c Lab Results  Component Value Date   HGBA1C 9.3 (A) 01/03/2023   Last thyroid functions Lab Results  Component Value Date   TSH 1.200 05/20/2018   Last vitamin D No results found for: "25OHVITD2", "25OHVITD3", "VD25OH" Last vitamin B12 and Folate No results found for: "VITAMINB12", "FOLATE"    The 10-year ASCVD risk score (Arnett DK, et al., 2019) is: 12.5%    Assessment & Plan:   1. Diabetes mellitus without complication (HCC) - He will continue on current medication, low carb/non concentrated sweet diet and exercise as tolerated. He was encouraged to check his blood glucose, record and bring log to follow up appointment.  2. Flu-like symptoms - Per patient , his symptoms are improving, was advised to increase fluid intake and rest. He was advised to go to the ED for worsening symptoms.   Return in about 11 weeks (around 04/18/2023), or if symptoms worsen or fail to improve.    Chalsea Darko Trellis Paganini, NP

## 2023-01-31 NOTE — Patient Instructions (Signed)
Recuento de carbohidratos para la diabetes mellitus en los adultos Carbohydrate Counting for Diabetes Mellitus, Adult El recuento de carbohidratos es un mtodo para llevar un registro de la cantidad de carbohidratos que se ingieren. La ingesta de carbohidratos aumenta la cantidad de azcar (glucosa) en la sangre. El recuento de la cantidad de carbohidratos que ingiere mejora el control de su glucemia. Esto, a su vez, le ayuda a controlar su diabetes. Los carbohidratos se miden en gramos (g) por porcin. Es importante saber la cantidad de carbohidratos (en gramos o por tamao de porcin) que se puede ingerir en cada comida. Esto es diferente en cada persona. Un nutricionista puede ayudarlo a crear un plan de alimentacin y a calcular la cantidad de carbohidratos que debe ingerir en cada comida y colacin. Qu alimentos contienen carbohidratos? Los siguientes alimentos incluyen carbohidratos: Granos, como panes y cereales. Frijoles secos y productos con soja. Verduras con almidn, como papas, guisantes y maz. Frutas y jugos de frutas. Leche y yogur. Dulces y colaciones, como pasteles, galletas, caramelos, papas fritas de bolsa y refrescos. Cmo se calculan los carbohidratos de los alimentos? Hay dos maneras de calcular los carbohidratos de los alimentos. Puede leer las etiquetas de los alimentos o aprender cules son los tamaos de las porciones estndar de los alimentos. Puede usar cualquiera de estos mtodos o una combinacin de ambos. Usar la etiqueta de informacin nutricional La lista de Informacin nutricional est incluida en las etiquetas de casi todas las bebidas y los alimentos envasados de los [place]. Esto incluye lo siguiente: El tamao de la porcin. Informacin sobre los nutrientes de cada porcin, incluidos los gramos de carbohidratos por porcin. Para usar la Informacin nutricional, decida cuntas porciones tomar. Luego, multiplique el nmero de porciones por la cantidad  de carbohidratos por porcin. El nmero resultante es la cantidad total de carbohidratos que comer. Conocer los tamaos de las porciones estndar de los alimentos Cuando coma alimentos que contengan carbohidratos y que no estn envasados o no incluyan la informacin nutricional en la etiqueta, debe medir las porciones para poder calcular los gramos de carbohidratos. Mida los alimentos que comer con una balanza de alimentos o una taza medidora, si es necesario. Decida cuntas porciones de tamao estndar comer. Multiplique el nmero de porciones por 15. En los alimentos que contienen carbohidratos, una porcin equivale a 15 g de carbohidratos. Por ejemplo, si come 2 tazas o 10 onzas (300 g) de fresas, habr comido 2 porciones y 30 g de carbohidratos (2 porciones x 15 g = 30 g). En el caso de las comidas que contienen mezclas de ms de un alimento, como las sopas y los guisos, debe calcular los carbohidratos de cada alimento que se incluye. La siguiente lista contiene los tamaos de porciones estndar de los alimentos ricos en carbohidratos ms comunes. Cada una de estas porciones tiene aproximadamente 15 g de carbohidratos: 1 rebanada de pan. 1 tortilla de seis pulgadas (15 cm). ? de taza o 2 onzas (53 g) de arroz o pasta cocidos.  taza o 3 onzas (85 g) de lentejas o frijoles cocidos o enlatados, escurridos y enjuagados.  taza o 3 onzas (85 g) de verduras con almidn, como guisantes, maz o zapallo.  taza o 4 onzas (120 g) de cereal caliente.  taza o 3 onzas (85 g) de papas hervidas o en pur, o  o 3 onzas (85 g) de una papa grande al horno.  taza o 4 onzas fluidas (118 ml) de jugo de frutas. 1 taza u   8 onzas fluidas (237 ml) de leche. 1 unidad pequea o 4 onzas (106 g) de manzana.  unidad o 2 onzas (63 g) de una banana mediana. 1 taza o 5 oz (150 g) de fresas. 3 tazas o 1 oz (28.3 g) de palomitas de maz. Cul sera un ejemplo de recuento de carbohidratos? Para calcular los gramos  de carbohidratos de este ejemplo de comida, siga los pasos que se describen a continuacin. Ejemplo de comida 3 onzas (85 g) de pechugas de pollo. ? de taza o 4 onzas (106 g) de arroz integral.  taza o 3 onzas (85 g) de maz. 1 taza u 8 onzas fluidas (237 ml) de leche. 1 taza o 5 onzas (150 g) de fresas con crema batida sin azcar. Clculo de carbohidratos Identifique los alimentos que contienen carbohidratos: Arroz. Maz. Leche. Fresas. Calcule cuntas porciones come de cada alimento: 2 porciones de arroz. 1 porcin de maz. 1 porcin de leche. 1 porcin de fresas. Multiplique cada nmero de porciones por 15 g: 2 porciones de arroz x 15 g = 30 g. 1 porcin de maz x 15 g = 15 g. 1 porcin de leche x 15 g = 15 g. 1 porcin de fresas x 15 g = 15 g. Sume todas las cantidades para conocer el total de gramos de carbohidratos consumidos: 30 g + 15 g + 15 g + 15 g = 75 g de carbohidratos en total. Consejos para seguir este plan Al ir de compras [place]ore un plan de comidas y luego haga una lista de compras. Compre verduras frescas y congeladas, frutas frescas y congeladas, productos lcteos, huevos, frijoles, lentejas y cereales integrales. Fjese en las etiquetas de los alimentos. Elija los alimentos que tengan ms fibra y menos azcar. Evite los alimentos procesados y los alimentos con azcares agregados. Planificacin de las comidas Trate de consumir la misma cantidad de gramos de carbohidratos en cada comida y en cada colacin. Planifique tomar comidas y colaciones regulares y equilibradas. Dnde buscar ms informacin American Diabetes Association (Asociacin Estadounidense de la Diabetes): diabetes.org Centers for Disease Control and Prevention (Centros para el Control y la Prevencin de [place]): cdc.gov Academy of Nutrition and Dietetics (Academia de Nutricin y Diettica): eatright.org Association of Diabetes Care & Education Specialists (Asociacin de Especialistas en  Atencin y Educacin sobre la Diabetes): diabeteseducator.org Resumen El recuento de carbohidratos es un mtodo para llevar un registro de la cantidad de carbohidratos que se ingieren. La ingesta de carbohidratos aumenta la cantidad de azcar (glucosa) en la sangre. El recuento de la cantidad de carbohidratos que ingiere mejora el control de su glucemia. Esto le ayuda a controlar su diabetes. Un nutricionista puede ayudarlo a crear un plan de alimentacin y a calcular la cantidad de carbohidratos que debe ingerir en cada comida y colacin. Esta informacin no tiene como fin reemplazar el consejo del mdico. Asegrese de hacerle al mdico cualquier pregunta que tenga. Document Revised: 12/15/2019 Document Reviewed: 12/15/2019 Elsevier Patient Education  2024 Elsevier Inc.  

## 2023-03-27 ENCOUNTER — Other Ambulatory Visit: Payer: Self-pay

## 2023-04-18 ENCOUNTER — Ambulatory Visit: Payer: Self-pay | Admitting: Gerontology

## 2023-04-18 VITALS — BP 117/78 | HR 92 | Ht 65.0 in | Wt 163.1 lb

## 2023-04-18 DIAGNOSIS — E785 Hyperlipidemia, unspecified: Secondary | ICD-10-CM

## 2023-04-18 DIAGNOSIS — E119 Type 2 diabetes mellitus without complications: Secondary | ICD-10-CM

## 2023-04-18 DIAGNOSIS — I10 Essential (primary) hypertension: Secondary | ICD-10-CM

## 2023-04-18 MED ORDER — METFORMIN HCL 1000 MG PO TABS: ORAL_TABLET | ORAL | 3 refills | Status: DC

## 2023-04-18 MED ORDER — CARVEDILOL 12.5 MG PO TABS: ORAL_TABLET | ORAL | 3 refills | Status: DC

## 2023-04-18 MED ORDER — GLIPIZIDE ER 10 MG PO TB24: 10.0000 mg | ORAL_TABLET | Freq: Every day | ORAL | 3 refills | Status: DC

## 2023-04-18 MED ORDER — ATORVASTATIN CALCIUM 40 MG PO TABS: 40.0000 mg | ORAL_TABLET | Freq: Every day | ORAL | 3 refills | Status: DC

## 2023-04-18 NOTE — Patient Instructions (Signed)
 Recuento de carbohidratos para la diabetes mellitus en los adultos Carbohydrate Counting for Diabetes Mellitus, Adult El recuento de carbohidratos es un mtodo para llevar un registro de la cantidad de carbohidratos que se ingieren. La ingesta de carbohidratos aumenta la cantidad de azcar (glucosa) en la sangre. El recuento de la cantidad de carbohidratos que ingiere mejora el control de su glucemia. Esto, a su vez, le ayuda a controlar su diabetes. Los carbohidratos se miden en gramos (g) por porcin. Es importante saber la cantidad de carbohidratos (en gramos o por tamao de porcin) que se puede ingerir en cada comida. Esto es Government social research officer. Un nutricionista puede ayudarlo a crear un plan de alimentacin y a calcular la cantidad de carbohidratos que debe ingerir en cada comida y colacin. Qu alimentos contienen carbohidratos? Los siguientes alimentos incluyen carbohidratos: Granos, como panes y cereales. Frijoles secos y productos con soja. Verduras con almidn, como papas, guisantes y maz. Joretta y jugos de frutas. Leche y Dentist. Dulces y colaciones, como pasteles, galletas, caramelos, papas fritas de bolsa y refrescos. Cmo se calculan los carbohidratos de los alimentos? Hay dos maneras de calcular los carbohidratos de los alimentos. Puede leer las etiquetas de los alimentos o aprender cules son los tamaos de las porciones estndar de los alimentos. Puede usar cualquiera de estos mtodos o una combinacin de [place]. Usar la Air cabin crew de informacin nutricional La lista de Informacin nutricional est incluida en las etiquetas de casi todas las bebidas y los alimentos envasados de los [place]. Esto incluye lo siguiente: El tamao de la porcin. Informacin sobre los nutrientes de cada porcin, incluidos los gramos de carbohidratos por porcin. Para usar la Informacin nutricional, decida cuntas porciones tomar. Luego, multiplique el nmero de porciones por la cantidad  de carbohidratos por porcin. El nmero resultante es la cantidad total de carbohidratos que comer. Conocer los tamaos de las porciones estndar de los alimentos Cuando coma alimentos que contengan carbohidratos y que no estn envasados o no incluyan la informacin nutricional en la etiqueta, debe medir las porciones para poder calcular los gramos de carbohidratos. Mida los alimentos que comer con una balanza de alimentos o una taza medidora, si es necesario. Decida cuntas porciones de Programmer, systems. Multiplique el nmero de porciones por 15. En los alimentos que contienen carbohidratos, una porcin [place] a 15 g de carbohidratos. Por ejemplo, si come 2 tazas o 10 onzas (300 g) de fresas, habr comido 2 porciones y 30 g de carbohidratos (2 porciones x 15 g = 30 g). En el caso de las comidas que contienen mezclas de ms de un alimento, como las sopas y los guisos, debe calcular los carbohidratos de cada alimento que se incluye. La siguiente lista contiene los tamaos de porciones estndar de los alimentos ricos en carbohidratos ms comunes. Cada una de estas porciones tiene aproximadamente 15 g de carbohidratos: 1 rebanada de pan. 1 tortilla de seis pulgadas (15 cm). ? de taza o 2 onzas (53 g) de arroz o pasta cocidos.  taza o 3 onzas (85 g) de lentejas o frijoles cocidos o enlatados, escurridos y enjuagados.  taza o 3 onzas (85 g) de verduras con almidn, como guisantes, maz o zapallo.  taza o 4 onzas (120 g) de cereal caliente.  taza o 3 onzas (85 g) de papas hervidas o en pur, o  o 3 onzas (85 g) de una papa grande al horno.  taza o 4 onzas fluidas (118 ml) de jugo de frutas. 1 taza u  8 onzas fluidas (237 ml) de leche. 1 unidad pequea o 4 onzas (106 g) de manzana.  unidad o 2 onzas (63 g) de una banana mediana. 1 taza o 5 oz (150 g) de fresas. 3 tazas o 1 oz (28.3 g) de palomitas de maz. Cul sera un ejemplo de recuento de carbohidratos? Para calcular los gramos  de carbohidratos de este ejemplo de comida, siga los pasos que se describen a continuacin. Ejemplo de comida 3 onzas (85 g) de pechugas de pollo. ? de taza o 4 onzas (106 g) de arroz integral.  taza o 3 onzas (85 g) de maz. 1 taza u 8 onzas fluidas (237 ml) de leche. 1 taza o 5 onzas (150 g) de fresas con crema batida sin azcar. Clculo de carbohidratos Identifique los alimentos que contienen carbohidratos: Arroz. Maz. Leche. Jaclynn. Calcule cuntas porciones come de cada alimento: 2 porciones de arroz. 1 porcin de maz. 1 porcin de leche. 1 porcin de fresas. Multiplique cada nmero de porciones por 15 g: 2 porciones de arroz x 15 g = 30 g. 1 porcin de maz x 15 g = 15 g. 1 porcin de leche x 15 g = 15 g. 1 porcin de fresas x 15 g = 15 g. Sume todas las cantidades para conocer el total de gramos de carbohidratos consumidos: 30 g + 15 g + 15 g + 15 g = 75 g de carbohidratos en total. Consejos para seguir este plan Al ir de compras El[employer]ore un plan de comidas y luego haga una lista de compras. Compre verduras frescas y congeladas, frutas frescas y congeladas, productos lcteos, huevos, frijoles, lentejas y cereales integrales. Fjese en las etiquetas de los alimentos. Elija los alimentos que tengan ms fibra y menos azcar. Evite los alimentos procesados y los alimentos con Biochemist, clinical. Planificacin de las comidas Trate de consumir la misma cantidad de gramos de carbohidratos en cada comida y en cada colacin. Planifique tomar comidas y colaciones regulares y equilibradas. Dnde buscar ms informacin American Diabetes Association (Asociacin Estadounidense de la Diabetes): diabetes.org Centers for Disease Control and Prevention (Centros para el Control y la Prevencin de [place]): TonerPromos.no Academy of Nutrition and Dietetics (Academia de Nutricin y Pension scheme manager): Theme [email or web address] Association of Diabetes Care & Education Specialists (Asociacin de Especialistas en  Atencin y Educacin sobre la Diabetes): diabeteseducator.org Resumen El recuento de carbohidratos es un mtodo para llevar un registro de la cantidad de carbohidratos que se ingieren. La ingesta de carbohidratos aumenta la cantidad de azcar (glucosa) en la sangre. El recuento de la cantidad de carbohidratos que ingiere mejora el control de su glucemia. Esto le ayuda a Chief Operating Officer su diabetes. Un nutricionista puede ayudarlo a crear un plan de alimentacin y a calcular la cantidad de carbohidratos que debe ingerir en cada comida y colacin. Esta informacin no tiene Theme park manager el consejo del mdico. Asegrese de hacerle al mdico cualquier pregunta que tenga. Document Revised: 12/15/2019 Document Reviewed: 12/15/2019 Elsevier Patient Education  2024 [employer].

## 2023-04-18 NOTE — Progress Notes (Signed)
 Established Patient Office Visit  Subjective   Patient ID: Hector Weber, male    DOB: May 31, 1970  Age: 53 y.o. MRN: [id number]  Chief Complaint  Patient presents with   Diabetes    HPI  Hector Weber is a 53 y/o male who has a history of T2DM, hypertension, and hyperlipidemia presents for a follow up visit.  His HgbA1c checked during visit decreased from 9.3% to 8.2% and blood glucose was 280 mg/dl.  He states that he has not been checking his blood glucose as he should, but denies hypo-/hyperglycemic symptoms, peripheral neuropathy and performs daily foot checks.  He states that he is compliant with his medications, denies side effects and continues to make healthy lifestyle changes.  He states that his mother was murdered in [place] and they before Christmas and requests medication as needed for his nerves and anxiety.  He declines referral for grief counseling, states that his mood is dysphoric, denies suicidal or homicidal ideation.  Overall, he states that he is doing well and offers no further complaints.  Review of Systems  Eyes: Negative.   Respiratory: Negative.    Cardiovascular: Negative.   Neurological: Negative.   Endo/Heme/Allergies: Negative.       Objective:     BP 117/78 (BP Location: Left Arm)   Pulse 92   Ht 5\' 5"  (1.651 m)   Wt 163 lb 1.6 oz (74 kg)   SpO2 96%   BMI 27.14 kg/m  BP Readings from Last 3 Encounters:  04/18/23 117/78  01/31/23 131/77  01/03/23 129/82   Wt Readings from Last 3 Encounters:  04/18/23 163 lb 1.6 oz (74 kg)  01/31/23 162 lb 9.6 oz (73.8 kg)  01/03/23 163 lb 3.2 oz (74 kg)      Physical Exam HENT:     Head: Normocephalic and atraumatic.     Mouth/Throat:     Mouth: Mucous membranes are moist.  Eyes:     Pupils: Pupils are equal, round, and reactive to light.  Cardiovascular:     Rate and Rhythm: Normal rate and regular rhythm.     Pulses: Normal pulses.     Heart sounds: Normal heart sounds.  Pulmonary:      Effort: Pulmonary effort is normal.     Breath sounds: Normal breath sounds.  Neurological:     General: No focal deficit present.     Mental Status: [name] alert and oriented to person, place, and time.  Psychiatric:        Mood and Affect: Mood normal.        Behavior: Behavior normal.      No results found for any visits on 04/18/23.  Last CBC Lab Results  Component Value Date   WBC 5.7 10/04/2022   HGB 15.9 10/04/2022   HCT 45.3 10/04/2022   MCV 89 10/04/2022   MCH 31.2 10/04/2022   RDW 12.5 10/04/2022   PLT 246 10/04/2022   Last metabolic panel Lab Results  Component Value Date   GLUCOSE 140 (H) 10/04/2022   NA 138 10/04/2022   K 4.6 10/04/2022   CL 100 10/04/2022   CO2 24 10/04/2022   BUN 22 10/04/2022   CREATININE 0.82 10/04/2022   EGFR 107 10/04/2022   CALCIUM  9.4 10/04/2022   PROT 7.4 10/04/2022   ALBUMIN 4.7 10/04/2022   LABGLOB 2.7 10/04/2022   AGRATIO 1.8 09/07/2021   BILITOT 0.8 10/04/2022   ALKPHOS 90 10/04/2022   AST 20 10/04/2022  ALT 30 10/04/2022   Last lipids Lab Results  Component Value Date   CHOL 167 10/04/2022   HDL 54 10/04/2022   LDLCALC 98 10/04/2022   TRIG 77 10/04/2022   CHOLHDL 3.1 10/04/2022   Last hemoglobin A1c Lab Results  Component Value Date   HGBA1C 9.3 (A) 01/03/2023   Last thyroid functions Lab Results  Component Value Date   TSH 1.200 05/20/2018   Last vitamin D No results found for: "25OHVITD2", "25OHVITD3", "VD25OH" Last vitamin B12 and Folate No results found for: "VITAMINB12", "FOLATE"    The 10-year ASCVD risk score (Arnett DK, et al., 2019) is: 10.3%    Assessment & Plan:    1. Hyperlipidemia LDL goal <70 -The 10-year ASCVD risk score (Arnett DK, et al., 2019) is: 10.3%   Values used to calculate the score:     Age: 55 years     Sex: Male     Is Non-Hispanic African American: No     Diabetic: Yes     Tobacco smoker: Yes     Systolic Blood Pressure: 117 mmHg     Is BP treated: Yes      HDL Cholesterol: 54 mg/dL     Total Cholesterol: 167 mg/dL He will continue current medication, low-fat/cholesterol diet and exercise as tolerated. - atorvastatin  (LIPITOR) 40 MG tablet; Take 1 tablet (40 mg total) by mouth daily.  Dispense: 30 tablet; Refill: 3  2. Essential hypertension -Blood pressure is under control, he will continue current medication DASH diet and exercise as tolerated. - carvedilol  (COREG ) 12.5 MG tablet; Take 12.5 mg twice daily  Dispense: 60 tablet; Refill: 3  3. Type 2 diabetes mellitus without complication, without long-term current use of insulin (HCC) (Primary) -His diabetes improving, his hemoglobin A1c was 8.2% and his goal should be less than 7%.  He will continue current medication, low carbohydrate/low concentrated sweet diet and exercise as tolerated.  He was advised to check his blood glucose twice daily, record and log to follow-up appointment. - glipiZIDE  (GLUCOTROL  XL) 10 MG 24 hr tablet; Take 1 tablet (10 mg total) by mouth daily with breakfast.  Dispense: 30 tablet; Refill: 3 - metFORMIN  (GLUCOPHAGE ) 1000 MG tablet; TAKE 1 TABLET BY MOUTH TWICE A DAY WITH A MEAL  Dispense: 60 tablet; Refill: 3   Return in about 13 weeks (around 07/18/2023), or if symptoms worsen or fail to improve, for f/u 07/18/23 in clinic.    Tenishia Ekman E Venna Berberich, NP

## 2023-07-18 ENCOUNTER — Ambulatory Visit: Payer: Self-pay | Admitting: Gerontology

## 2023-07-18 VITALS — BP 128/65 | HR 98 | Ht 64.57 in | Wt 162.9 lb

## 2023-07-18 DIAGNOSIS — R051 Acute cough: Secondary | ICD-10-CM

## 2023-07-18 DIAGNOSIS — E785 Hyperlipidemia, unspecified: Secondary | ICD-10-CM

## 2023-07-18 DIAGNOSIS — E119 Type 2 diabetes mellitus without complications: Secondary | ICD-10-CM

## 2023-07-18 DIAGNOSIS — R059 Cough, unspecified: Secondary | ICD-10-CM | POA: Insufficient documentation

## 2023-07-18 DIAGNOSIS — I10 Essential (primary) hypertension: Secondary | ICD-10-CM

## 2023-07-18 LAB — POCT GLYCOSYLATED HEMOGLOBIN (HGB A1C): Hemoglobin A1C: 8.9 % — AB (ref 4.0–5.6)

## 2023-07-18 MED ORDER — BENZONATATE 100 MG PO CAPS: 100.0000 mg | ORAL_CAPSULE | Freq: Three times a day (TID) | ORAL | 0 refills | Status: DC | PRN

## 2023-07-18 MED ORDER — METFORMIN HCL 1000 MG PO TABS: ORAL_TABLET | ORAL | 3 refills | Status: DC

## 2023-07-18 MED ORDER — GLIPIZIDE ER 10 MG PO TB24: 10.0000 mg | ORAL_TABLET | Freq: Every day | ORAL | 3 refills | Status: DC

## 2023-07-18 MED ORDER — CARVEDILOL 12.5 MG PO TABS: ORAL_TABLET | ORAL | 3 refills | Status: DC

## 2023-07-18 MED ORDER — ATORVASTATIN CALCIUM 40 MG PO TABS: 40.0000 mg | ORAL_TABLET | Freq: Every day | ORAL | 3 refills | Status: DC

## 2023-07-18 NOTE — Progress Notes (Signed)
 Established Patient Office Visit  Subjective   Patient ID: Hector Weber, male    DOB: September 09, 1970  Age: 53 y.o. MRN: [id number]  Chief Complaint  Patient presents with   Follow-up    HPI  Hector Weber is a 53 y/o male who has a history of T2DM, hypertension, and hyperlipidemia presents for a follow up visit. 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 He states he takes his scheduled daily medications, denies side effects and continues to make healthy lifestyle changes.His HgbA1c checked during visit decreased from 9.3% to 8.9% and his blood glucose was 131 mg/dl. He checks his blood glucose 1 to 2 times daily and states that it ranges between 100 -150 mg/dl. He denies any hypo-hyperglycemic symptoms and performs daily foot checks. He  states that his mood is good, denies suicidal nor homicidal ideations. He states he has rhinorrhea, productive cough with whitish phlegm that started yesterday likely due to weather changes and pollen. He denies fever, chills and sick contacts. He states that using otc cough medication moderately relieve his symptoms but cough wakes him up at night. Overall, he states that he is is doing okay and offers no further complaint.   Review of Systems  Constitutional: Negative.  Negative for fever.  HENT: Negative.  Negative for congestion, ear pain, sinus pain and sore throat.   Eyes: Negative.   Respiratory:  Positive for cough.   Cardiovascular: Negative.   Gastrointestinal: Negative.   Musculoskeletal: Negative.   Skin: Negative.   Neurological: Negative.   Endo/Heme/Allergies: Negative.   Psychiatric/Behavioral: Negative.        Objective:     BP 128/65 (BP Location: Left Arm, Patient Position: Sitting)   Pulse 98   Ht 5' 4.57" (1.64 m)   Wt 162 lb 14.4 oz (73.9 kg)   SpO2 97%   BMI 27.47 kg/m  BP Readings from Last 3 Encounters:  07/18/23 128/65  04/18/23 117/78  01/31/23 131/77   Wt Readings from Last 3 Encounters:   07/18/23 162 lb 14.4 oz (73.9 kg)  04/18/23 163 lb 1.6 oz (74 kg)  01/31/23 162 lb 9.6 oz (73.8 kg)      Physical Exam HENT:     Head: Normocephalic and atraumatic.     Right Ear: Tympanic membrane, ear canal and external ear normal.     Left Ear: Tympanic membrane, ear canal and external ear normal.     Nose: Nose normal.     Mouth/Throat:     Mouth: Mucous membranes are moist.     Pharynx: Oropharynx is clear.  Eyes:     Extraocular Movements: Extraocular movements intact.     Conjunctiva/sclera: Conjunctivae normal.     Pupils: Pupils are equal, round, and reactive to light.  Cardiovascular:     Rate and Rhythm: Normal rate and regular rhythm.     Pulses: Normal pulses.     Heart sounds: Normal heart sounds.  Pulmonary:     Effort: Pulmonary effort is normal.     Breath sounds: Normal breath sounds.  Skin:    General: Skin is warm.     Capillary Refill: Capillary refill takes less than 2 seconds.  Neurological:     General: No focal deficit present.     Mental Status: [name] alert and oriented to person, place, and time.  Psychiatric:        Mood and Affect: Mood normal.        Behavior: Behavior normal.  Thought Content: Thought content normal.        Judgment: Judgment normal.      No results found for any visits on 07/18/23.  Last CBC Lab Results  Component Value Date   WBC 5.7 10/04/2022   HGB 15.9 10/04/2022   HCT 45.3 10/04/2022   MCV 89 10/04/2022   MCH 31.2 10/04/2022   RDW 12.5 10/04/2022   PLT 246 10/04/2022   Last metabolic panel Lab Results  Component Value Date   GLUCOSE 140 (H) 10/04/2022   NA 138 10/04/2022   K 4.6 10/04/2022   CL 100 10/04/2022   CO2 24 10/04/2022   BUN 22 10/04/2022   CREATININE 0.82 10/04/2022   EGFR 107 10/04/2022   CALCIUM  9.4 10/04/2022   PROT 7.4 10/04/2022   ALBUMIN 4.7 10/04/2022   LABGLOB 2.7 10/04/2022   AGRATIO 1.8 09/07/2021   BILITOT 0.8 10/04/2022   ALKPHOS 90 10/04/2022   AST 20  10/04/2022   ALT 30 10/04/2022   Last lipids Lab Results  Component Value Date   CHOL 167 10/04/2022   HDL 54 10/04/2022   LDLCALC 98 10/04/2022   TRIG 77 10/04/2022   CHOLHDL 3.1 10/04/2022   Last hemoglobin A1c Lab Results  Component Value Date   HGBA1C 9.3 (A) 01/03/2023   Last thyroid functions Lab Results  Component Value Date   TSH 1.200 05/20/2018   Last vitamin D No results found for: "25OHVITD2", "25OHVITD3", "VD25OH" Last vitamin B12 and Folate No results found for: "VITAMINB12", "FOLATE"    The 10-year ASCVD risk score (Arnett DK, et al., 2019) is: 13%    Assessment & Plan:   1. Diabetes mellitus without complication (HCC) - His diabetes is improving, his HgbA1c was 8.9%, and his goal should be less than 7%. He will continue on current medication, advised to check his blood glucose bid, record and bring log to follow up appointment. He was advised to continue on low carb/non concentrated sweet diet and exercise as tolerated. - glipiZIDE  (GLUCOTROL  XL) 10 MG 24 hr tablet; Take 1 tablet (10 mg total) by mouth daily with breakfast.  Dispense: 30 tablet; Refill: 3 - metFORMIN  (GLUCOPHAGE ) 1000 MG tablet; TAKE 1 TABLET BY MOUTH TWICE A DAY WITH A MEAL  Dispense: 60 tablet; Refill: 3 - POCT HgB A1C; Future - POCT Glucose (CBG); Future - POCT Glucose (CBG) - POCT HgB A1C  2. Essential hypertension - His blood pressure is improving, will continue current medication, DASH diet and exercise as tolerated. - carvedilol  (COREG ) 12.5 MG tablet; Take 12.5 mg twice daily  Dispense: 60 tablet; Refill: 3   3. Hyperlipidemia LDL goal <70 - He will continue current medication, low fat/cholesterol diet and exercise as tolerated. - atorvastatin  (LIPITOR) 40 MG tablet; Take 1 tablet (40 mg total) by mouth daily.  Dispense: 30 tablet; Refill: 3  4. Acute cough - He was started on Tessalon  Perles, educated on medication side effects and advised to notify clinic, increase water  intake. - benzonatate  (TESSALON  PERLES) 100 MG capsule; Take 1 capsule (100 mg total) by mouth 3 (three) times daily as needed.  Dispense: 20 capsule; Refill: 0   Return in about 14 weeks (around 10/24/2023), or if symptoms worsen or fail to improve.    Chioma E Iloabachie, NP

## 2023-07-18 NOTE — Patient Instructions (Signed)
 Recuento de carbohidratos para la diabetes mellitus en los adultos Carbohydrate Counting for Diabetes Mellitus, Adult El recuento de carbohidratos es un mtodo para llevar un registro de la cantidad de carbohidratos que se ingieren. La ingesta de carbohidratos aumenta la cantidad de azcar (glucosa) en la sangre. El recuento de la cantidad de carbohidratos que ingiere mejora el control de su glucemia. Esto, a su vez, le ayuda a controlar su diabetes. Los carbohidratos se miden en gramos (g) por porcin. Es importante saber la cantidad de carbohidratos (en gramos o por tamao de porcin) que se puede ingerir en cada comida. Esto es Government social research officer. Un nutricionista puede ayudarlo a crear un plan de alimentacin y a calcular la cantidad de carbohidratos que debe ingerir en cada comida y colacin. Qu alimentos contienen carbohidratos? Los siguientes alimentos incluyen carbohidratos: Granos, como panes y cereales. Frijoles secos y productos con soja. Verduras con almidn, como papas, guisantes y maz. Nils Pyle y jugos de frutas. Leche y Dentist. Dulces y colaciones, como pasteles, galletas, caramelos, papas fritas de bolsa y refrescos. Cmo se calculan los carbohidratos de los alimentos? Hay dos maneras de calcular los carbohidratos de los alimentos. Puede leer las etiquetas de los alimentos o aprender cules son los tamaos de las porciones estndar de los alimentos. Puede usar cualquiera de estos mtodos o una combinacin de [place]. Usar la Air cabin crew de informacin nutricional La lista de Informacin nutricional est incluida en las etiquetas de casi todas las bebidas y los alimentos envasados de los [place]. Esto incluye lo siguiente: El tamao de la porcin. Informacin sobre los nutrientes de cada porcin, incluidos los gramos de carbohidratos por porcin. Para usar la Informacin nutricional, decida cuntas porciones tomar. Luego, multiplique el nmero de porciones por la cantidad  de carbohidratos por porcin. El nmero resultante es la cantidad total de carbohidratos que comer. Conocer los tamaos de las porciones estndar de los alimentos Cuando coma alimentos que contengan carbohidratos y que no estn envasados o no incluyan la informacin nutricional en la etiqueta, debe medir las porciones para poder calcular los gramos de carbohidratos. Mida los alimentos que comer con una balanza de alimentos o una taza medidora, si es necesario. Decida cuntas porciones de Programmer, systems. Multiplique el nmero de porciones por 15. En los alimentos que contienen carbohidratos, una porcin [place] a 15 g de carbohidratos. Por ejemplo, si come 2 tazas o 10 onzas (300 g) de fresas, habr comido 2 porciones y 30 g de carbohidratos (2 porciones x 15 g = 30 g). En el caso de las comidas que contienen mezclas de ms de un alimento, como las sopas y los guisos, debe calcular los carbohidratos de cada alimento que se incluye. La siguiente lista contiene los tamaos de porciones estndar de los alimentos ricos en carbohidratos ms comunes. Cada una de estas porciones tiene aproximadamente 15 g de carbohidratos: 1 rebanada de pan. 1 tortilla de seis pulgadas (15 cm). ? de taza o 2 onzas (53 g) de arroz o pasta cocidos.  taza o 3 onzas (85 g) de lentejas o frijoles cocidos o enlatados, escurridos y enjuagados.  taza o 3 onzas (85 g) de verduras con almidn, como guisantes, maz o zapallo.  taza o 4 onzas (120 g) de cereal caliente.  taza o 3 onzas (85 g) de papas hervidas o en pur, o  o 3 onzas (85 g) de una papa grande al horno.  taza o 4 onzas fluidas (118 ml) de jugo de frutas. 1 taza u  8 onzas fluidas (237 ml) de leche. 1 unidad pequea o 4 onzas (106 g) de manzana.  unidad o 2 onzas (63 g) de una banana mediana. 1 taza o 5 oz (150 g) de fresas. 3 tazas o 1 oz (28.3 g) de palomitas de maz. Cul sera un ejemplo de recuento de carbohidratos? Para calcular los gramos  de carbohidratos de este ejemplo de comida, siga los pasos que se describen a continuacin. Ejemplo de comida 3 onzas (85 g) de pechugas de pollo. ? de taza o 4 onzas (106 g) de arroz integral.  taza o 3 onzas (85 g) de maz. 1 taza u 8 onzas fluidas (237 ml) de leche. 1 taza o 5 onzas (150 g) de fresas con crema batida sin azcar. Clculo de carbohidratos Identifique los alimentos que contienen carbohidratos: Arroz. Maz. Leche. Jinny Sanders. Calcule cuntas porciones come de cada alimento: 2 porciones de arroz. 1 porcin de maz. 1 porcin de leche. 1 porcin de fresas. Multiplique cada nmero de porciones por 15 g: 2 porciones de arroz x 15 g = 30 g. 1 porcin de maz x 15 g = 15 g. 1 porcin de leche x 15 g = 15 g. 1 porcin de fresas x 15 g = 15 g. Sume todas las cantidades para conocer el total de gramos de carbohidratos consumidos: 30 g + 15 g + 15 g + 15 g = 75 g de carbohidratos en total. Consejos para seguir este plan Al ir de compras [place] un plan de comidas y luego haga una lista de compras. Compre verduras frescas y congeladas, frutas frescas y congeladas, productos lcteos, huevos, frijoles, lentejas y cereales integrales. Fjese en las etiquetas de los alimentos. Elija los alimentos que tengan ms fibra y Neurosurgeon. Evite los alimentos procesados y los alimentos con Biochemist, clinical. Planificacin de las comidas Trate de consumir la misma cantidad de gramos de carbohidratos en cada comida y en cada colacin. Planifique tomar comidas y colaciones regulares y equilibradas. Dnde buscar ms informacin American Diabetes Association (Asociacin Estadounidense de la Diabetes): diabetes.org Centers for Disease Control and Prevention (Centros para el Control y la Prevencin de [place]): TonerPromos.no Academy of Nutrition and Dietetics (Academia de Nutricin y Pension scheme manager): Theme [email or web address] Association of Diabetes Care & Education Specialists (Asociacin de Especialistas en  Atencin y Educacin sobre la Diabetes): diabeteseducator.org Resumen El recuento de carbohidratos es un mtodo para llevar un registro de la cantidad de carbohidratos que se ingieren. La ingesta de carbohidratos aumenta la cantidad de azcar (glucosa) en la sangre. El recuento de la cantidad de carbohidratos que ingiere mejora el control de su glucemia. Esto le ayuda a Chief Operating Officer su diabetes. Un nutricionista puede ayudarlo a crear un plan de alimentacin y a calcular la cantidad de carbohidratos que debe ingerir en cada comida y colacin. Esta informacin no tiene Theme park manager el consejo del mdico. Asegrese de hacerle al mdico cualquier pregunta que tenga. Document Revised: 12/15/2019 Document Reviewed: 12/15/2019 Elsevier Patient Education  2024 [employer].

## 2023-07-30 LAB — GLUCOSE, POCT (MANUAL RESULT ENTRY): POC Glucose: 131 mg/dL — AB (ref 70–99)

## 2023-07-30 LAB — POCT GLYCOSYLATED HEMOGLOBIN (HGB A1C): Hemoglobin A1C: 8.2 % — AB (ref 4.0–5.6)

## 2023-10-17 ENCOUNTER — Other Ambulatory Visit: Payer: Self-pay

## 2023-10-17 ENCOUNTER — Ambulatory Visit: Payer: Self-pay

## 2023-10-24 ENCOUNTER — Ambulatory Visit: Payer: Self-pay | Admitting: Gerontology

## 2023-10-24 ENCOUNTER — Other Ambulatory Visit: Payer: Self-pay

## 2023-10-24 ENCOUNTER — Ambulatory Visit: Payer: Self-pay

## 2023-10-24 VITALS — BP 136/72 | HR 77 | Temp 97.0°F | Ht 65.0 in | Wt 161.6 lb

## 2023-10-24 DIAGNOSIS — Z0000 Encounter for general adult medical examination without abnormal findings: Secondary | ICD-10-CM

## 2023-10-24 DIAGNOSIS — E119 Type 2 diabetes mellitus without complications: Secondary | ICD-10-CM

## 2023-10-24 DIAGNOSIS — I10 Essential (primary) hypertension: Secondary | ICD-10-CM

## 2023-10-24 LAB — POCT GLYCOSYLATED HEMOGLOBIN (HGB A1C): Hemoglobin A1C: 8.4 % — AB (ref 4.0–5.6)

## 2023-10-24 LAB — GLUCOSE, POCT (MANUAL RESULT ENTRY): POC Glucose: 142 mg/dL — AB (ref 70–99)

## 2023-10-24 NOTE — Progress Notes (Signed)
 Established Patient Office Visit  Subjective   Patient ID: Hector Weber, male    DOB: 1970/05/19  Age: 53 y.o. MRN: [id number]  Chief Complaint  Patient presents with   Follow-up    HPI  Hector Weber Andrik Sandt is a 53 y/o male who has a history of T2DM, hypertension, and hyperlipidemia presents for a follow up visit. 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 He states that he's compliant with his medications, denies side effects and continues to make healthy lifestyle changes. His HgbA1c checked during visit decreased from 8.9% to 8.4%, and blood glucose was 142 mg/dl. He states that he checks his blood glucose daily, and if he is usually in the 140's.  He denies hypo-/hyperglycemic symptoms, peripheral neuropathy and continues to to perform daily foot checks.  Overall, he states that he is doing well and offers no further complaints.  Review of Systems  Constitutional: Negative.   Eyes: Negative.   Cardiovascular: Negative.   Genitourinary: Negative.   Skin: Negative.   Neurological: Negative.   Endo/Heme/Allergies: Negative.   Psychiatric/Behavioral: Negative.        Objective:     BP 136/72 (BP Location: Left Arm, Patient Position: Sitting, Cuff Size: Normal)   Pulse 77   Temp (!) 97 F (36.1 C) (Oral)   Ht 5' 5 (1.651 m)   Wt 161 lb 9.6 oz (73.3 kg)   SpO2 98%   BMI 26.89 kg/m  BP Readings from Last 3 Encounters:  10/24/23 136/72  07/18/23 128/65  04/18/23 117/78   Wt Readings from Last 3 Encounters:  10/24/23 161 lb 9.6 oz (73.3 kg)  07/18/23 162 lb 14.4 oz (73.9 kg)  04/18/23 163 lb 1.6 oz (74 kg)      Physical Exam HENT:     Head: Normocephalic and atraumatic.     Mouth/Throat:     Mouth: Mucous membranes are moist.     Pharynx: Oropharynx is clear.  Eyes:     Extraocular Movements: Extraocular movements intact.     Pupils: Pupils are equal, round, and reactive to light.  Cardiovascular:     Rate and Rhythm: Normal rate and regular  rhythm.     Pulses: Normal pulses.     Heart sounds: Normal heart sounds.  Pulmonary:     Effort: Pulmonary effort is normal.     Breath sounds: Normal breath sounds.  Skin:    General: Skin is warm.     Capillary Refill: Capillary refill takes less than 2 seconds.  Neurological:     General: No focal deficit present.     Mental Status: [name] alert and oriented to person, place, and time.  Psychiatric:        Mood and Affect: Mood normal.        Behavior: Behavior normal.        Thought Content: Thought content normal.        Judgment: Judgment normal.      Results for orders placed or performed in visit on 10/24/23  POCT HgB A1C  Result Value Ref Range   Hemoglobin A1C 8.4 (A) 4.0 - 5.6 %   HbA1c POC (<> result, manual entry)     HbA1c, POC (prediabetic range)     HbA1c, POC (controlled diabetic range)    POCT Glucose (CBG)  Result Value Ref Range   POC Glucose 142 (A) 70 - 99 mg/dl  Results for orders placed or performed in visit on 10/24/23  Comp  Met (CMET)  Result Value Ref Range   Glucose 160 (H) 70 - 99 mg/dL   BUN 14 6 - 24 mg/dL   Creatinine, Ser 9.33 (L) 0.76 - 1.27 mg/dL   eGFR 885 >40 fO/fpw/8.26   BUN/Creatinine Ratio 21 (H) 9 - 20   Sodium 139 134 - 144 mmol/L   Potassium 3.9 3.5 - 5.2 mmol/L   Chloride 101 96 - 106 mmol/L   CO2 23 20 - 29 mmol/L   Calcium  9.3 8.7 - 10.2 mg/dL   Total Protein 7.2 6.0 - 8.5 g/dL   Albumin 4.5 3.8 - 4.9 g/dL   Globulin, Total 2.7 1.5 - 4.5 g/dL   Bilirubin Total 0.5 0.0 - 1.2 mg/dL   Alkaline Phosphatase 103 44 - 121 IU/L   AST 18 0 - 40 IU/L   ALT 29 0 - 44 IU/L  CBC w/Diff  Result Value Ref Range   WBC 4.9 3.4 - 10.8 x10E3/uL   RBC 4.83 4.14 - 5.80 x10E6/uL   Hemoglobin 14.6 13.0 - 17.7 g/dL   Hematocrit 56.1 62.4 - 51.0 %   MCV 91 79 - 97 fL   MCH 30.2 26.6 - 33.0 pg   MCHC 33.3 31.5 - 35.7 g/dL   RDW 87.5 88.3 - 84.5 %   Platelets 246 150 - 450 x10E3/uL   Neutrophils 48 Not Estab. %   Lymphs 38 Not Estab.  %   Monocytes 11 Not Estab. %   Eos 2 Not Estab. %   Basos 1 Not Estab. %   Neutrophils Absolute 2.4 1.4 - 7.0 x10E3/uL   Lymphocytes Absolute 1.9 0.7 - 3.1 x10E3/uL   Monocytes Absolute 0.6 0.1 - 0.9 x10E3/uL   EOS (ABSOLUTE) 0.1 0.0 - 0.4 x10E3/uL   Basophils Absolute 0.0 0.0 - 0.2 x10E3/uL   Immature Granulocytes 0 Not Estab. %   Immature Grans (Abs) 0.0 0.0 - 0.1 x10E3/uL  Lipid panel  Result Value Ref Range   Cholesterol, Total 139 100 - 199 mg/dL   Triglycerides 92 0 - 149 mg/dL   HDL 43 >60 mg/dL   VLDL Cholesterol Cal 17 5 - 40 mg/dL   LDL Chol Calc (NIH) 79 0 - 99 mg/dL   Chol/HDL Ratio 3.2 0.0 - 5.0 ratio  Microalbumin / creatinine urine ratio  Result Value Ref Range   Creatinine, Urine 121.3 Not Estab. mg/dL   Microalbumin, Urine 4.2 Not Estab. ug/mL   Microalb/Creat Ratio 3 0 - 29 mg/g creat    Last CBC Lab Results  Component Value Date   WBC 4.9 10/24/2023   HGB 14.6 10/24/2023   HCT 43.8 10/24/2023   MCV 91 10/24/2023   MCH 30.2 10/24/2023   RDW 12.4 10/24/2023   PLT 246 10/24/2023   Last metabolic panel Lab Results  Component Value Date   GLUCOSE 160 (H) 10/24/2023   NA 139 10/24/2023   K 3.9 10/24/2023   CL 101 10/24/2023   CO2 23 10/24/2023   BUN 14 10/24/2023   CREATININE 0.66 (L) 10/24/2023   EGFR 114 10/24/2023   CALCIUM  9.3 10/24/2023   PROT 7.2 10/24/2023   ALBUMIN 4.5 10/24/2023   LABGLOB 2.7 10/24/2023   AGRATIO 1.8 09/07/2021   BILITOT 0.5 10/24/2023   ALKPHOS 103 10/24/2023   AST 18 10/24/2023   ALT 29 10/24/2023   Last lipids Lab Results  Component Value Date   CHOL 139 10/24/2023   HDL 43 10/24/2023   LDLCALC 79 10/24/2023   TRIG 92 10/24/2023  CHOLHDL 3.2 10/24/2023   Last hemoglobin A1c Lab Results  Component Value Date   HGBA1C 8.4 (A) 10/24/2023   Last thyroid functions Lab Results  Component Value Date   TSH 1.200 05/20/2018   Last vitamin D No results found for: 25OHVITD2, 25OHVITD3, VD25OH Last  vitamin B12 and Folate No results found for: VITAMINB12, FOLATE    The 10-year ASCVD risk score (Arnett DK, et al., 2019) is: 14.1%    Assessment & Plan:   1. Health care maintenance -Will check routine labs. - Microalbumin / creatinine urine ratio; Future - Lipid panel; Future - CBC w/Diff; Future - Comp Met (CMET); Future  2. Type 2 diabetes mellitus without complication, without long-term current use of insulin (HCC) (Primary) -Diabetes is improving, his A1c was 8.4% and his goal should be less than 7%.  He was advised to continue current medication, low carbohydrate/non concentrated sweet diet and exercise as tolerated. - POCT Glucose (CBG); Future - POCT HgB A1C; Future - POCT HgB A1C - POCT Glucose (CBG)  3. Essential hypertension -His blood pressure is improving, he will continue current medication, DASH diet and exercise as tolerated.    Return in about 13 weeks (around 01/23/2024), or if symptoms worsen or fail to improve, for f/u 01/23/24 in clinic.    Willette Mudry E Dana Dorner, NP

## 2023-10-24 NOTE — Patient Instructions (Signed)

## 2023-10-27 LAB — COMPREHENSIVE METABOLIC PANEL WITH GFR
ALT: 29 IU/L (ref 0–44)
AST: 18 IU/L (ref 0–40)
Albumin: 4.5 g/dL (ref 3.8–4.9)
Alkaline Phosphatase: 103 IU/L (ref 44–121)
BUN/Creatinine Ratio: 21 — ABNORMAL HIGH (ref 9–20)
BUN: 14 mg/dL (ref 6–24)
Bilirubin Total: 0.5 mg/dL (ref 0.0–1.2)
CO2: 23 mmol/L (ref 20–29)
Calcium: 9.3 mg/dL (ref 8.7–10.2)
Chloride: 101 mmol/L (ref 96–106)
Creatinine, Ser: 0.66 mg/dL — ABNORMAL LOW (ref 0.76–1.27)
Globulin, Total: 2.7 g/dL (ref 1.5–4.5)
Glucose: 160 mg/dL — ABNORMAL HIGH (ref 70–99)
Potassium: 3.9 mmol/L (ref 3.5–5.2)
Sodium: 139 mmol/L (ref 134–144)
Total Protein: 7.2 g/dL (ref 6.0–8.5)
eGFR: 114 mL/min/1.73 (ref >59)

## 2023-10-27 LAB — CBC WITH DIFFERENTIAL/PLATELET
Basophils Absolute: 0 x10E3/uL (ref 0.0–0.2)
Basos: 1 %
EOS (ABSOLUTE): 0.1 x10E3/uL (ref 0.0–0.4)
Eos: 2 %
Hematocrit: 43.8 % (ref 37.5–51.0)
Hemoglobin: 14.6 g/dL (ref 13.0–17.7)
Immature Grans (Abs): 0 x10E3/uL (ref 0.0–0.1)
Immature Granulocytes: 0 %
Lymphocytes Absolute: 1.9 x10E3/uL (ref 0.7–3.1)
Lymphs: 38 %
MCH: 30.2 pg (ref 26.6–33.0)
MCHC: 33.3 g/dL (ref 31.5–35.7)
MCV: 91 fL (ref 79–97)
Monocytes Absolute: 0.6 x10E3/uL (ref 0.1–0.9)
Monocytes: 11 %
Neutrophils Absolute: 2.4 x10E3/uL (ref 1.4–7.0)
Neutrophils: 48 %
Platelets: 246 x10E3/uL (ref 150–450)
RBC: 4.83 x10E6/uL (ref 4.14–5.80)
RDW: 12.4 % (ref 11.6–15.4)
WBC: 4.9 x10E3/uL (ref 3.4–10.8)

## 2023-10-27 LAB — LIPID PANEL
Chol/HDL Ratio: 3.2 ratio (ref 0.0–5.0)
Cholesterol, Total: 139 mg/dL (ref 100–199)
HDL: 43 mg/dL (ref >39)
LDL Chol Calc (NIH): 79 mg/dL (ref 0–99)
Triglycerides: 92 mg/dL (ref 0–149)
VLDL Cholesterol Cal: 17 mg/dL (ref 5–40)

## 2023-10-27 LAB — MICROALBUMIN / CREATININE URINE RATIO
Creatinine, Urine: 121.3 mg/dL
Microalb/Creat Ratio: 3 mg/g{creat} (ref 0–29)
Microalbumin, Urine: 4.2 ug/mL

## 2024-01-23 ENCOUNTER — Ambulatory Visit: Payer: Self-pay | Admitting: Gerontology

## 2024-01-23 VITALS — BP 125/77 | HR 84 | Temp 97.6°F | Ht 65.0 in | Wt 160.8 lb

## 2024-01-23 DIAGNOSIS — R051 Acute cough: Secondary | ICD-10-CM

## 2024-01-23 DIAGNOSIS — E119 Type 2 diabetes mellitus without complications: Secondary | ICD-10-CM

## 2024-01-23 LAB — POCT GLYCOSYLATED HEMOGLOBIN (HGB A1C): Hemoglobin A1C: 8.8 % — AB (ref 4.0–5.6)

## 2024-01-23 LAB — GLUCOSE, POCT (MANUAL RESULT ENTRY): POC Glucose: 112 mg/dL — AB (ref 70–99)

## 2024-01-23 MED ORDER — BENZONATATE 100 MG PO CAPS: 100.0000 mg | ORAL_CAPSULE | Freq: Three times a day (TID) | ORAL | 0 refills | Status: DC | PRN

## 2024-01-23 NOTE — Patient Instructions (Signed)
 Diabetes: cmo calcular los carbohidratos que necesita un adulto Diabetes: Carbohydrate Counting for Adults El recuento de carbohidratos es un mtodo para llevar un registro de la cantidad de carbohidratos que ingiere. La ingesta de carbohidratos aumenta la cantidad de azcar, tambin llamada glucosa, en la sangre. Si cuenta cuntos carbohidratos ingiere, controlar mejor su nivel de azcar en la sangre. Esto, a su vez, le ayuda a controlar la diabetes. Los carbohidratos se calculan en gramos (G) por porcin. Es importante saber la cantidad de carbohidratos (en gramos o por tamao de porcin) que puede ingerir en cada comida. Esto es Government social research officer. Un nutricionista puede ayudarlo a crear un plan de alimentacin y a calcular la cantidad de carbohidratos que debe ingerir en cada comida y colacin. Qu alimentos contienen carbohidratos? Los carbohidratos se encuentran en estos alimentos: Granos, como panes y cereales. Frijoles secos y alimentos con soja. Verduras con almidn, como papas, guisantes y maz. Joretta y jugos de frutas. Leche y Dentist. Dulces y colaciones, como pasteles, galletas, caramelos, papas fritas y refrescos. Cmo se calculan los carbohidratos de los alimentos? Hay dos maneras de calcular los carbohidratos de los alimentos. Puede leer las etiquetas o conocer el tamao de las porciones estndar de los alimentos. Puede usar cualquiera de estos mtodos o combinarlos. Usar la Air cabin crew de informacin nutricional La lista de nutrientes est incluida en las etiquetas de casi todas las bebidas y los alimentos envasados de los [place]. Incluye: El tamao de la porcin. Informacin sobre los nutrientes de cada porcin. Esto incluye los gramos de carbohidratos por porcin. Para usar la Informacin nutricional, decida cuntas porciones comer. Luego, multiplique el nmero de porciones por la cantidad de carbohidratos que tiene cada porcin. El nmero resultante son los  gramos totales de carbohidratos que ingerir. Conocer los tamaos de las porciones estndar de los alimentos Cuando consuma alimentos con carbohidratos y sin envasar, o sin la informacin nutricional en la etiqueta, debe medir las porciones para calcular los gramos de carbohidratos. Dosifique los alimentos que consumir con una balanza de alimentos o una taza medidora si es necesario. Decida cuntas porciones de Programmer, systems. Multiplique el nmero de porciones por 15. En los alimentos con carbohidratos, una porcin equivale a 15 G de carbohidratos. Por ejemplo, si consume 2 tazas o 10 OZ (300 G) de fresas, habr ingerido 2 porciones y 26 G de carbohidratos (2 porciones x 15 G = 30 G). Para las comidas que contienen mezclas de ms de un alimento, como las sopas y los guisos, debe calcular los carbohidratos de cada alimento. A continuacin, se presenta una lista de los tamaos estndar de las porciones de alimentos ricos en carbohidratos. Cada una de estas porciones tiene alrededor de 15 G de carbohidratos: 1 rebanada de pan. 1 tortilla de seis pulgadas (15 CM). ? de taza o 2 OZ (53 G) de arroz o pasta cocidos.  taza o 3 OZ (85 G) de lentejas o frijoles cocidos o enlatados, escurridos y enjuagados.  taza o 3 OZ (85 G) de verduras con almidn, como guisantes, maz o zapallo.  taza o 4 OZ (120 G) de cereal cocinado.  taza o 3 OZ (85 G) de papas hervidas o pur, o  o 3 OZ (85 G) de una papa grande al horno.  taza o 4 OZ fluidas (118 ML) de jugo de frutas. 1 taza u 8 OZ fluidas (237 ML) de [place]. 1 unidad pequea o 4 OZ (106 G) de manzana.  unidad o 2 OZ (63  G) de una banana mediana. 1 taza o 5 OZ (150 G) de fresas. 3 tazas o 1 OZ (28.3 G) de palomitas de maz. Cul sera un ejemplo para calcular los carbohidratos? Para calcular los gramos de carbohidratos del siguiente ejemplo, siga los pasos descritos a continuacin. Ejemplo de comida 3 OZ (85 G) de pechugas de pollo. ? de  taza o 4 OZ (106 G) de arroz integral.  taza o 3 OZ (85 G) de maz. 1 taza u 8 OZ fluidas (237 ML) de [place]. 1 taza o 5 OZ (150 G) de fresas con crema batida sin azcar. Clculo de carbohidratos Identifique los alimentos con carbohidratos: Arroz. Maz. Leche. Hector Weber. Calcule cuntas porciones come de cada alimento: 2 porciones de arroz. 1 porcin de maz. 1 porcin de leche. 1 porcin de fresas. Multiplique el nmero de porciones por 15 G: 2 porciones de arroz x 15 G = 30 G. 1 porcin de maz x 15 G = 15 G. 1 porcin de leche x 15 G = 15 G. 1 porcin de fresas x 15 G = 15 G. Sume todas las cantidades para calcular los gramos totales de carbohidratos ingeridos: 30 G + 15 G + 15 G + 15 G = 75 G de carbohidratos totales. Dnde obtener ms informacin Para obtener ms informacin, visite los siguientes sitios web: American Diabetes Association (Asociacin Estadounidense de Diabetes): diabetes.org. Haga clic en "Search" (buscar) y escriba "carb counting" (recuento de carbohidratos). Busque el enlace que necesita. Centers for Disease Control and Prevention (Centros para el Control y la Prevencin de [place]): TonerPromos.no. Haga clic en "Search" (buscar) y escriba "diabetes". Busque el enlace que necesita. Academy of Nutrition and Dietetics (Academia de Nutricin y Pension scheme manager): eatright.org Esta informacin no tiene Theme park manager el consejo del mdico. Asegrese de hacerle al mdico cualquier pregunta que tenga. Document Revised: 04/17/2023 Document Reviewed: 04/17/2023 Elsevier Patient Education  2025 [employer].

## 2024-01-23 NOTE — Progress Notes (Signed)
 Established Patient Office Visit  Subjective   Patient ID: Hector Weber, male    DOB: May 15, 1970  Age: 53 y.o. MRN: [id number]  Chief Complaint  Patient presents with   Follow-up    HPI  Hector Weber is a 53 y/o male who has a history of T2DM, hypertension, and hyperlipidemia presents for a follow up visit.He states that he's compliant with his medications, denies side effects and continues to make healthy lifestyle changes. His HgbA1c checked during visit increased from 8.4% to 8.8%, and blood glucose was 112 mg/dl. He states that he checks his fasting blood glucose daily, states that it's usually less than 140 mg/dl. He denies hypo/hyperglycemic symptoms, peripheral neuropathy and performs daily foot checks. Currently, he c/o productive cough with clear phelgm that has been going on for 2 weeks, states that cough keeps him up at night, denies sick contacts, fever, chills, sore throat. Overall, he states that he's doing well and offers no further complaints.  Review of Systems  Constitutional: Negative.   Eyes: Negative.   Respiratory:  Positive for cough.   Cardiovascular: Negative.   Skin: Negative.   Neurological: Negative.   Endo/Heme/Allergies: Negative.   Psychiatric/Behavioral: Negative.        Objective:     BP 125/77 (BP Location: Left Arm, Patient Position: Sitting, Cuff Size: Large)   Pulse 84   Temp 97.6 F (36.4 C) (Oral)   Ht 5' 5 (1.651 m)   Wt 160 lb 12.8 oz (72.9 kg)   SpO2 98%   BMI 26.76 kg/m  BP Readings from Last 3 Encounters:  01/23/24 125/77  10/24/23 136/72  07/18/23 128/65   Wt Readings from Last 3 Encounters:  01/23/24 160 lb 12.8 oz (72.9 kg)  10/24/23 161 lb 9.6 oz (73.3 kg)  07/18/23 162 lb 14.4 oz (73.9 kg)      Physical Exam HENT:     Head: Normocephalic and atraumatic.     Nose: Nose normal.     Mouth/Throat:     Mouth: Mucous membranes are moist.     Pharynx: Oropharynx is clear.  Eyes:     Extraocular  Movements: Extraocular movements intact.     Pupils: Pupils are equal, round, and reactive to light.  Cardiovascular:     Rate and Rhythm: Normal rate and regular rhythm.     Pulses: Normal pulses.     Heart sounds: Normal heart sounds.  Pulmonary:     Effort: Pulmonary effort is normal.     Breath sounds: Normal breath sounds.  Skin:    General: Skin is warm.  Neurological:     General: No focal deficit present.     Mental Status: [name] alert and oriented to person, place, and time.  Psychiatric:        Mood and Affect: Mood normal.        Behavior: Behavior normal.        Thought Content: Thought content normal.        Judgment: Judgment normal.      Results for orders placed or performed in visit on 01/23/24  POCT Glucose (CBG)  Result Value Ref Range   POC Glucose 112 (A) 70 - 99 mg/dl  POCT HgB J8R  Result Value Ref Range   Hemoglobin A1C 8.8 (A) 4.0 - 5.6 %   HbA1c POC (<> result, manual entry)     HbA1c, POC (prediabetic range)     HbA1c, POC (controlled diabetic range)  Last CBC Lab Results  Component Value Date   WBC 4.9 10/24/2023   HGB 14.6 10/24/2023   HCT 43.8 10/24/2023   MCV 91 10/24/2023   MCH 30.2 10/24/2023   RDW 12.4 10/24/2023   PLT 246 10/24/2023   Last metabolic panel Lab Results  Component Value Date   GLUCOSE 160 (H) 10/24/2023   NA 139 10/24/2023   K 3.9 10/24/2023   CL 101 10/24/2023   CO2 23 10/24/2023   BUN 14 10/24/2023   CREATININE 0.66 (L) 10/24/2023   EGFR 114 10/24/2023   CALCIUM  9.3 10/24/2023   PROT 7.2 10/24/2023   ALBUMIN 4.5 10/24/2023   LABGLOB 2.7 10/24/2023   AGRATIO 1.8 09/07/2021   BILITOT 0.5 10/24/2023   ALKPHOS 103 10/24/2023   AST 18 10/24/2023   ALT 29 10/24/2023   Last lipids Lab Results  Component Value Date   CHOL 139 10/24/2023   HDL 43 10/24/2023   LDLCALC 79 10/24/2023   TRIG 92 10/24/2023   CHOLHDL 3.2 10/24/2023   Last hemoglobin A1c Lab Results  Component Value Date   HGBA1C 8.8  (A) 01/23/2024   Last thyroid functions Lab Results  Component Value Date   TSH 1.200 05/20/2018   Last vitamin D No results found for: 25OHVITD2, 25OHVITD3, VD25OH Last vitamin B12 and Folate No results found for: VITAMINB12, FOLATE    The 10-year ASCVD risk score (Arnett DK, et al., 2019) is: 12.3%    Assessment & Plan:   1. Type 2 diabetes mellitus without complication, without long-term current use of insulin (HCC) (Primary) - His diabetes is not under control, his HgbA1c was 8.8%, goal should be less tan 7%. He agreed to continue on current medication, low carb/ non concentrated sweet diet and exercise as tolerated. - POCT HgB A1C; Future - POCT Glucose (CBG); Future - POCT Glucose (CBG) - POCT HgB A1C  2. Acute cough - He was started on Benzonatate , educated on medication side effects and advised to notify patient. - benzonatate  (TESSALON  PERLES) 100 MG capsule; Take 1 capsule (100 mg total) by mouth 3 (three) times daily as needed.  Dispense: 20 capsule; Refill: 0    Return in about 14 weeks (around 04/30/2024), or if symptoms worsen or fail to improve.    Leanda Padmore E Bralynn Velador, NP

## 2024-01-30 ENCOUNTER — Other Ambulatory Visit: Payer: Self-pay

## 2024-01-30 DIAGNOSIS — E119 Type 2 diabetes mellitus without complications: Secondary | ICD-10-CM

## 2024-01-30 DIAGNOSIS — I10 Essential (primary) hypertension: Secondary | ICD-10-CM

## 2024-01-30 DIAGNOSIS — E785 Hyperlipidemia, unspecified: Secondary | ICD-10-CM

## 2024-01-30 DIAGNOSIS — Z0000 Encounter for general adult medical examination without abnormal findings: Secondary | ICD-10-CM

## 2024-01-30 MED ORDER — ATORVASTATIN CALCIUM 40 MG PO TABS: 40.0000 mg | ORAL_TABLET | Freq: Every day | ORAL | 3 refills | Status: DC

## 2024-01-30 MED ORDER — GLIPIZIDE ER 10 MG PO TB24: 10.0000 mg | ORAL_TABLET | Freq: Every day | ORAL | 3 refills | Status: DC

## 2024-01-30 MED ORDER — ATORVASTATIN CALCIUM 40 MG PO TABS
40.0000 mg | ORAL_TABLET | Freq: Every day | ORAL | 3 refills | Status: DC
  Filled 2024-01-30: qty 30, 30d supply, fill #0

## 2024-01-30 MED ORDER — CARVEDILOL 12.5 MG PO TABS: ORAL_TABLET | ORAL | 3 refills | Status: DC

## 2024-01-30 MED ORDER — METFORMIN HCL 1000 MG PO TABS: ORAL_TABLET | ORAL | 3 refills | Status: DC

## 2024-01-30 MED ORDER — GLIPIZIDE ER 10 MG PO TB24
10.0000 mg | ORAL_TABLET | Freq: Every day | ORAL | 3 refills | Status: DC
  Filled 2024-01-30: qty 30, 30d supply, fill #0

## 2024-01-30 MED ORDER — CARVEDILOL 12.5 MG PO TABS
12.5000 mg | ORAL_TABLET | Freq: Two times a day (BID) | ORAL | 3 refills | Status: DC
  Filled 2024-01-30: qty 60, 30d supply, fill #0

## 2024-01-30 MED ORDER — METFORMIN HCL 1000 MG PO TABS
1000.0000 mg | ORAL_TABLET | Freq: Two times a day (BID) | ORAL | 3 refills | Status: DC
  Filled 2024-01-30: qty 60, 30d supply, fill #0

## 2024-02-04 ENCOUNTER — Other Ambulatory Visit: Payer: Self-pay

## 2024-02-04 DIAGNOSIS — Z23 Encounter for immunization: Secondary | ICD-10-CM

## 2024-02-11 ENCOUNTER — Other Ambulatory Visit: Payer: Self-pay

## 2024-04-30 ENCOUNTER — Ambulatory Visit: Payer: Self-pay

## 2024-05-14 ENCOUNTER — Ambulatory Visit: Payer: Self-pay | Admitting: Gerontology

## 2024-05-14 VITALS — BP 126/78 | HR 85 | Ht 64.0 in | Wt 162.0 lb

## 2024-05-14 DIAGNOSIS — I10 Essential (primary) hypertension: Secondary | ICD-10-CM

## 2024-05-14 DIAGNOSIS — E785 Hyperlipidemia, unspecified: Secondary | ICD-10-CM

## 2024-05-14 DIAGNOSIS — E119 Type 2 diabetes mellitus without complications: Secondary | ICD-10-CM

## 2024-05-14 LAB — GLUCOSE, POCT (MANUAL RESULT ENTRY): POC Glucose: 100 mg/dL — AB (ref 70–99)

## 2024-05-14 LAB — POCT GLYCOSYLATED HEMOGLOBIN (HGB A1C): Hemoglobin A1C: 7.9 % — AB (ref 4.0–5.6)

## 2024-05-14 MED ORDER — CARVEDILOL 12.5 MG PO TABS: 12.5000 mg | ORAL_TABLET | Freq: Two times a day (BID) | ORAL | 3 refills | Status: AC

## 2024-05-14 MED ORDER — ATORVASTATIN CALCIUM 40 MG PO TABS: 40.0000 mg | ORAL_TABLET | Freq: Every day | ORAL | 3 refills | Status: AC

## 2024-05-14 MED ORDER — METFORMIN HCL 1000 MG PO TABS: 1000.0000 mg | ORAL_TABLET | Freq: Two times a day (BID) | ORAL | 3 refills | Status: AC

## 2024-05-14 MED ORDER — GLIPIZIDE ER 10 MG PO TB24: 10.0000 mg | ORAL_TABLET | Freq: Every day | ORAL | 3 refills | Status: AC

## 2024-05-14 NOTE — Progress Notes (Signed)
 "  Established Patient Office Visit  Subjective   Patient ID: Hector Weber, male    DOB: 1970-10-17  Age: 54 y.o. MRN: [id number]  Chief Complaint  Patient presents with   Follow-up    HPI  Hector Weber is a 54 y/o male who has a history of T2DM, hypertension, and hyperlipidemia presents for a follow up visit.He states that he's compliant with his medications, denies side effects and continues to make healthy lifestyle changes. His HgbA1c checked during visit decreased from 8.8% to 7.9%. He states that he checks his blood glucose  daily, states that his reading is usually less than 130 mg/dl, though he did not bring his log.  He denies hypo/hyperglycemic symptoms, peripheral neuropathy and performs daily foot checks. He request his medication to be transferred back to [facility] Overall, he states that he's doing well and offers no further complaints. He denies Colon cancer screening.  Review of Systems  Constitutional: Negative.   Eyes: Negative.   Respiratory: Negative.    Cardiovascular: Negative.   Genitourinary: Negative.   Skin: Negative.   Neurological: Negative.   Endo/Heme/Allergies: Negative.   Psychiatric/Behavioral: Negative.        Objective:     BP 126/78 (BP Location: Left Arm, Patient Position: Sitting, Cuff Size: Normal)   Pulse 85   Ht 5' 4 (1.626 m)   Wt 162 lb (73.5 kg)   SpO2 98%   BMI 27.81 kg/m  BP Readings from Last 3 Encounters:  05/14/24 126/78  01/23/24 125/77  10/24/23 136/72   Wt Readings from Last 3 Encounters:  05/14/24 162 lb (73.5 kg)  01/23/24 160 lb 12.8 oz (72.9 kg)  10/24/23 161 lb 9.6 oz (73.3 kg)      Physical Exam HENT:     Head: Normocephalic and atraumatic.     Mouth/Throat:     Mouth: Mucous membranes are moist.     Pharynx: Oropharynx is clear.  Eyes:     Extraocular Movements: Extraocular movements intact.     Pupils: Pupils are equal, round, and reactive to light.  Cardiovascular:     Rate  and Rhythm: Normal rate and regular rhythm.     Pulses: Normal pulses.     Heart sounds: Normal heart sounds.  Pulmonary:     Effort: Pulmonary effort is normal.     Breath sounds: Normal breath sounds.  Skin:    General: Skin is warm.     Capillary Refill: Capillary refill takes less than 2 seconds.  Neurological:     General: No focal deficit present.     Mental Status: [name] alert and oriented to person, place, and time.  Psychiatric:        Mood and Affect: Mood normal.        Behavior: Behavior normal.      Results for orders placed or performed in visit on 05/14/24  POCT glucose (manual entry)  Result Value Ref Range   POC Glucose 100 (A) 70 - 99 mg/dl  POCT glycosylated hemoglobin (Hb A1C)  Result Value Ref Range   Hemoglobin A1C 7.9 (A) 4.0 - 5.6 %   HbA1c POC (<> result, manual entry)     HbA1c, POC (prediabetic range)     HbA1c, POC (controlled diabetic range)      Last CBC Lab Results  Component Value Date   WBC 4.9 10/24/2023   HGB 14.6 10/24/2023   HCT 43.8 10/24/2023   MCV 91 10/24/2023  MCH 30.2 10/24/2023   RDW 12.4 10/24/2023   PLT 246 10/24/2023   Last metabolic panel Lab Results  Component Value Date   GLUCOSE 160 (H) 10/24/2023   NA 139 10/24/2023   K 3.9 10/24/2023   CL 101 10/24/2023   CO2 23 10/24/2023   BUN 14 10/24/2023   CREATININE 0.66 (L) 10/24/2023   EGFR 114 10/24/2023   CALCIUM  9.3 10/24/2023   PROT 7.2 10/24/2023   ALBUMIN 4.5 10/24/2023   LABGLOB 2.7 10/24/2023   AGRATIO 1.8 09/07/2021   BILITOT 0.5 10/24/2023   ALKPHOS 103 10/24/2023   AST 18 10/24/2023   ALT 29 10/24/2023   Last lipids Lab Results  Component Value Date   CHOL 139 10/24/2023   HDL 43 10/24/2023   LDLCALC 79 10/24/2023   TRIG 92 10/24/2023   CHOLHDL 3.2 10/24/2023   Last hemoglobin A1c Lab Results  Component Value Date   HGBA1C 7.9 (A) 05/14/2024   Last thyroid functions Lab Results  Component Value Date   TSH 1.200 05/20/2018   Last  vitamin D No results found for: 25OHVITD2, 25OHVITD3, VD25OH Last vitamin B12 and Folate No results found for: VITAMINB12, FOLATE    The 10-year ASCVD risk score (Arnett DK, et al., 2019) is: 13.4%    Assessment & Plan:   1. Type 2 diabetes mellitus without complication, without long-term current use of insulin (HCC) (Primary) - His diabetes is improving, though his HgbA1c is not at goal of less than 7%, was 7.9%. He agrees to continue current medication, low carb/non concentrated sweet diet and exercise as tolerated. He agrees to check his blood glucose daily, record and bring log to follow up appointment. - POCT glucose (manual entry) - POCT glycosylated hemoglobin (Hb A1C) - Microalbumin / creatinine urine ratio; Future - glipiZIDE  (GLUCOTROL  XL) 10 MG 24 hr tablet; Take 1 tablet (10 mg total) by mouth daily with breakfast.  Dispense: 30 tablet; Refill: 3 - metFORMIN  (GLUCOPHAGE ) 1000 MG tablet; Take 1 tablet (1,000 mg total) by mouth 2 (two) times daily with a meal.  Dispense: 60 tablet; Refill: 3  2. Hyperlipidemia LDL goal <70 - He agrees to continue current medication, low fat/cholesterol diet and exercise as tolerated. - atorvastatin  (LIPITOR) 40 MG tablet; Take 1 tablet (40 mg total) by mouth daily.  Dispense: 30 tablet; Refill: 3  3. Essential hypertension - His blood pressure is improving, agrees to continue current medication, DASH diet and exercise as tolerated. - carvedilol  (COREG ) 12.5 MG tablet; Take 1 tablet (12.5 mg total) by mouth 2 (two) times daily.  Dispense: 60 tablet; Refill: 3    Return in about 13 weeks (around 08/13/2024), or if symptoms worsen or fail to improve.    Acel Natzke E Maija Biggers, NP  "

## 2024-05-14 NOTE — Patient Instructions (Signed)
 Diabetes: cmo calcular los carbohidratos que necesita un adulto Diabetes: Carbohydrate Counting for Adults El recuento de carbohidratos es un mtodo para llevar un registro de la cantidad de carbohidratos que ingiere. La ingesta de carbohidratos aumenta la cantidad de azcar, tambin llamada glucosa, en la sangre. Si cuenta cuntos carbohidratos ingiere, controlar mejor su nivel de azcar en la sangre. Esto, a su vez, le ayuda a controlar la diabetes. Los carbohidratos se calculan en gramos (G) por porcin. Es importante saber la cantidad de carbohidratos (en gramos o por tamao de porcin) que puede ingerir en cada comida. Esto es Government social research officer. Un nutricionista puede ayudarlo a crear un plan de alimentacin y a calcular la cantidad de carbohidratos que debe ingerir en cada comida y colacin. Qu alimentos contienen carbohidratos? Los carbohidratos se encuentran en estos alimentos: Granos, como panes y cereales. Frijoles secos y alimentos con soja. Verduras con almidn, como papas, guisantes y maz. Joretta y jugos de frutas. Leche y Dentist. Dulces y colaciones, como pasteles, galletas, caramelos, papas fritas y refrescos. Cmo se calculan los carbohidratos de los alimentos? Hay dos maneras de calcular los carbohidratos de los alimentos. Puede leer las etiquetas o conocer el tamao de las porciones estndar de los alimentos. Puede usar cualquiera de estos mtodos o combinarlos. Usar la Air cabin crew de informacin nutricional La lista de nutrientes est incluida en las etiquetas de casi todas las bebidas y los alimentos envasados de los [place]. Incluye: El tamao de la porcin. Informacin sobre los nutrientes de cada porcin. Esto incluye los gramos de carbohidratos por porcin. Para usar la Informacin nutricional, decida cuntas porciones comer. Luego, multiplique el nmero de porciones por la cantidad de carbohidratos que tiene cada porcin. El nmero resultante son los  gramos totales de carbohidratos que ingerir. Conocer los tamaos de las porciones estndar de los alimentos Cuando consuma alimentos con carbohidratos y sin envasar, o sin la informacin nutricional en la etiqueta, debe medir las porciones para calcular los gramos de carbohidratos. Dosifique los alimentos que consumir con una balanza de alimentos o una taza medidora si es necesario. Decida cuntas porciones de Programmer, systems. Multiplique el nmero de porciones por 15. En los alimentos con carbohidratos, una porcin equivale a 15 G de carbohidratos. Por ejemplo, si consume 2 tazas o 10 OZ (300 G) de fresas, habr ingerido 2 porciones y 26 G de carbohidratos (2 porciones x 15 G = 30 G). Para las comidas que contienen mezclas de ms de un alimento, como las sopas y los guisos, debe calcular los carbohidratos de cada alimento. A continuacin, se presenta una lista de los tamaos estndar de las porciones de alimentos ricos en carbohidratos. Cada una de estas porciones tiene alrededor de 15 G de carbohidratos: 1 rebanada de pan. 1 tortilla de seis pulgadas (15 CM). ? de taza o 2 OZ (53 G) de arroz o pasta cocidos.  taza o 3 OZ (85 G) de lentejas o frijoles cocidos o enlatados, escurridos y enjuagados.  taza o 3 OZ (85 G) de verduras con almidn, como guisantes, maz o zapallo.  taza o 4 OZ (120 G) de cereal cocinado.  taza o 3 OZ (85 G) de papas hervidas o pur, o  o 3 OZ (85 G) de una papa grande al horno.  taza o 4 OZ fluidas (118 ML) de jugo de frutas. 1 taza u 8 OZ fluidas (237 ML) de [place]. 1 unidad pequea o 4 OZ (106 G) de manzana.  unidad o 2 OZ (63  G) de una banana mediana. 1 taza o 5 OZ (150 G) de fresas. 3 tazas o 1 OZ (28.3 G) de palomitas de maz. Cul sera un ejemplo para calcular los carbohidratos? Para calcular los gramos de carbohidratos del siguiente ejemplo, siga los pasos descritos a continuacin. Ejemplo de comida 3 OZ (85 G) de pechugas de pollo. ? de  taza o 4 OZ (106 G) de arroz integral.  taza o 3 OZ (85 G) de maz. 1 taza u 8 OZ fluidas (237 ML) de [place]. 1 taza o 5 OZ (150 G) de fresas con crema batida sin azcar. Clculo de carbohidratos Identifique los alimentos con carbohidratos: Arroz. Maz. Leche. Jaclynn. Calcule cuntas porciones come de cada alimento: 2 porciones de arroz. 1 porcin de maz. 1 porcin de leche. 1 porcin de fresas. Multiplique el nmero de porciones por 15 G: 2 porciones de arroz x 15 G = 30 G. 1 porcin de maz x 15 G = 15 G. 1 porcin de leche x 15 G = 15 G. 1 porcin de fresas x 15 G = 15 G. Sume todas las cantidades para calcular los gramos totales de carbohidratos ingeridos: 30 G + 15 G + 15 G + 15 G = 75 G de carbohidratos totales. Dnde obtener ms informacin Para obtener ms informacin, visite los siguientes sitios web: American Diabetes Association (Asociacin Estadounidense de Diabetes): diabetes.org. Haga clic en "Search" (buscar) y escriba "carb counting" (recuento de carbohidratos). Busque el enlace que necesita. Centers for Disease Control and Prevention (Centros para el Control y la Prevencin de [place]): TonerPromos.no. Haga clic en "Search" (buscar) y escriba "diabetes". Busque el enlace que necesita. Academy of Nutrition and Dietetics (Academia de Nutricin y Pension scheme manager): eatright.org Esta informacin no tiene Theme park manager el consejo del mdico. Asegrese de hacerle al mdico cualquier pregunta que tenga. Document Revised: 04/17/2023 Document Reviewed: 04/17/2023 Elsevier Patient Education  2025 [employer].

## 2024-08-13 ENCOUNTER — Ambulatory Visit: Payer: Self-pay
